# Patient Record
Sex: Female | Born: 1935 | Race: White | Hispanic: No | Marital: Married | Smoking: Never smoker
Health system: Southern US, Community
[De-identification: ages and names within clinical notes are randomized; demographics above are authoritative.]

## PROBLEM LIST (undated history)

## (undated) DIAGNOSIS — M545 Low back pain, unspecified: Secondary | ICD-10-CM

## (undated) DIAGNOSIS — E785 Hyperlipidemia, unspecified: Secondary | ICD-10-CM

## (undated) DIAGNOSIS — E119 Type 2 diabetes mellitus without complications: Secondary | ICD-10-CM

## (undated) DIAGNOSIS — I10 Essential (primary) hypertension: Secondary | ICD-10-CM

## (undated) DIAGNOSIS — E7800 Pure hypercholesterolemia, unspecified: Secondary | ICD-10-CM

## (undated) DIAGNOSIS — I509 Heart failure, unspecified: Secondary | ICD-10-CM

## (undated) DIAGNOSIS — R011 Cardiac murmur, unspecified: Secondary | ICD-10-CM

## (undated) HISTORY — DX: Essential (primary) hypertension: I10

## (undated) HISTORY — PX: APPENDECTOMY: SHX54

## (undated) HISTORY — DX: Morbid (severe) obesity due to excess calories: E66.01

## (undated) HISTORY — DX: Low back pain: M54.5

## (undated) HISTORY — DX: Pure hypercholesterolemia, unspecified: E78.00

## (undated) HISTORY — DX: Hyperlipidemia, unspecified: E78.5

## (undated) HISTORY — DX: Low back pain, unspecified: M54.50

## (undated) HISTORY — DX: Cardiac murmur, unspecified: R01.1

## (undated) HISTORY — PX: TONSILLECTOMY: SUR1361

## (undated) HISTORY — DX: Type 2 diabetes mellitus without complications: E11.9

## (undated) HISTORY — DX: Heart failure, unspecified: I50.9

---

## 1999-03-13 ENCOUNTER — Encounter: Payer: Self-pay | Admitting: Gastroenterology

## 1999-03-13 ENCOUNTER — Ambulatory Visit (HOSPITAL_COMMUNITY): Admission: RE | Admit: 1999-03-13 | Discharge: 1999-03-13 | Payer: Self-pay | Admitting: Gastroenterology

## 2001-03-16 ENCOUNTER — Encounter: Payer: Self-pay | Admitting: Obstetrics and Gynecology

## 2001-03-16 ENCOUNTER — Ambulatory Visit (HOSPITAL_COMMUNITY): Admission: RE | Admit: 2001-03-16 | Discharge: 2001-03-16 | Payer: Self-pay | Admitting: Obstetrics and Gynecology

## 2001-04-14 ENCOUNTER — Encounter (INDEPENDENT_AMBULATORY_CARE_PROVIDER_SITE_OTHER): Payer: Self-pay

## 2001-04-14 ENCOUNTER — Ambulatory Visit (HOSPITAL_COMMUNITY): Admission: RE | Admit: 2001-04-14 | Discharge: 2001-04-14 | Payer: Self-pay | Admitting: Obstetrics and Gynecology

## 2001-04-14 ENCOUNTER — Encounter: Payer: Self-pay | Admitting: Obstetrics and Gynecology

## 2005-12-19 ENCOUNTER — Other Ambulatory Visit: Admission: RE | Admit: 2005-12-19 | Discharge: 2005-12-19 | Payer: Self-pay | Admitting: Obstetrics and Gynecology

## 2006-01-24 ENCOUNTER — Inpatient Hospital Stay (HOSPITAL_COMMUNITY): Admission: EM | Admit: 2006-01-24 | Discharge: 2006-01-25 | Payer: Self-pay | Admitting: Emergency Medicine

## 2006-01-24 ENCOUNTER — Encounter (INDEPENDENT_AMBULATORY_CARE_PROVIDER_SITE_OTHER): Payer: Self-pay | Admitting: Specialist

## 2007-04-28 IMAGING — CR DG ABDOMEN ACUTE W/ 1V CHEST
3 series · 3 of 3 positions shown · non-contrast
Comparison: None

CLINICAL DATA: Abdominal pain

ABDOMEN SERIES - 2 VIEW & CHEST - 1 VIEW

[w chest pa]
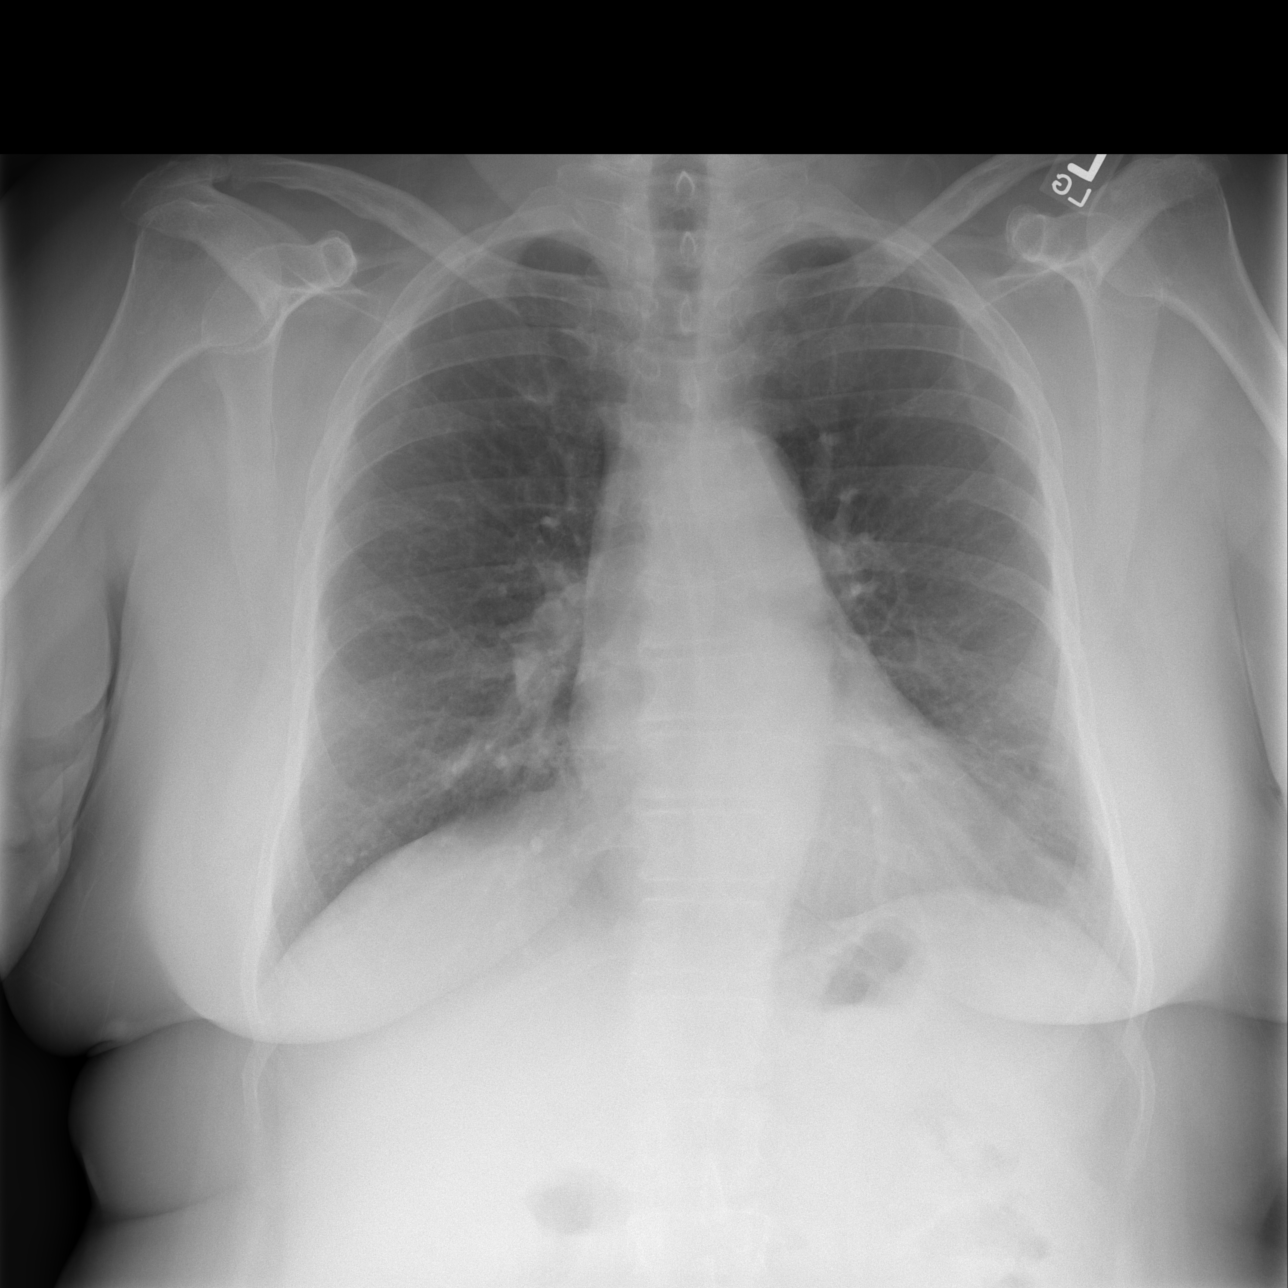

[w abdomen upright *]
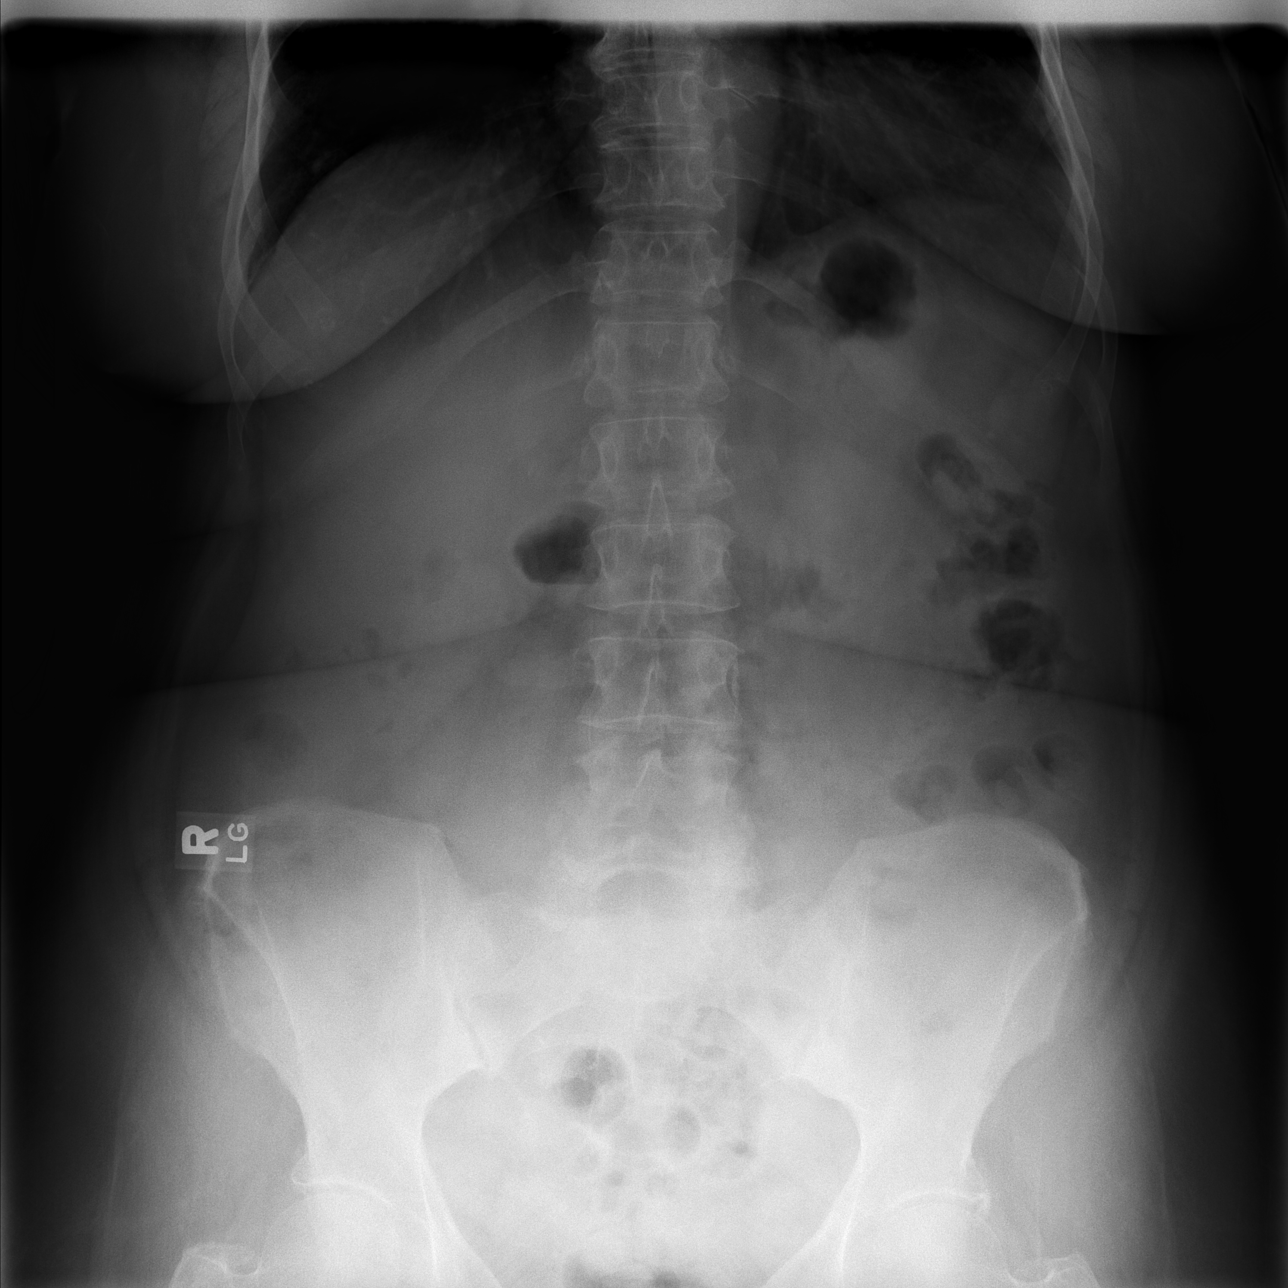

[t abdomen supine]
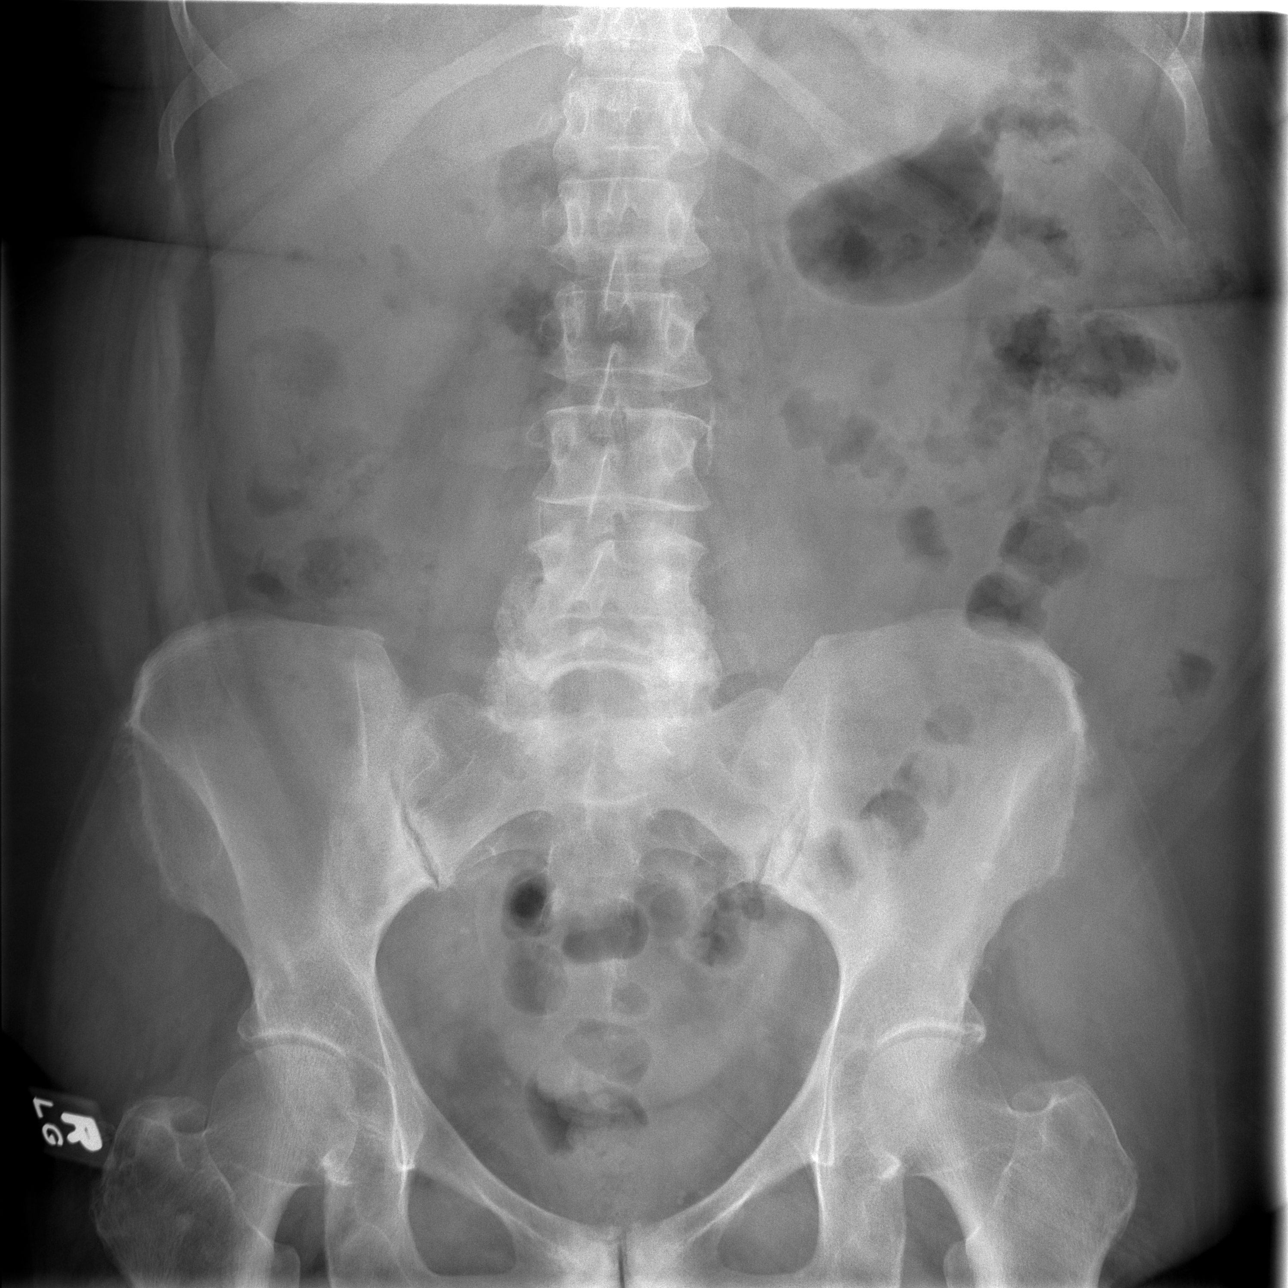

[3 of 3 positions shown; findings below may reference images not displayed]

FINDINGS: There is a nonobstructive bowel gas pattern. No free air. No
organomegaly or suspicious calcification. Mild degenerative changes in the lower
lumbar spine and hips.

Heart is normal size. Lungs are clear. No effusions.

IMPRESSION

No obstruction or free air.

## 2007-04-28 IMAGING — CT CT ABDOMEN W/ CM
2 of 5 series · 17 of 46 positions shown, 19 images · IV contrast (APPLIED)
Comparison: None

ABDOMEN CT WITH CONTRAST

CLINICAL DATA: Right lower quadrant pain
TECHNIQUE: Multidetector CT imaging of the abdomen and pelvis was performed
following the standard protocol during bolus administration of intravenous
contrast.

Contrast:  125 cc Omnipaque 300

[Series 2: abd_pel 5.0 b40f st · axial · 0.73mm/px · z∈[-460,-64]mm · 14 of 89 slices shown, 16 images]
[im 5/89  soft-tissue]
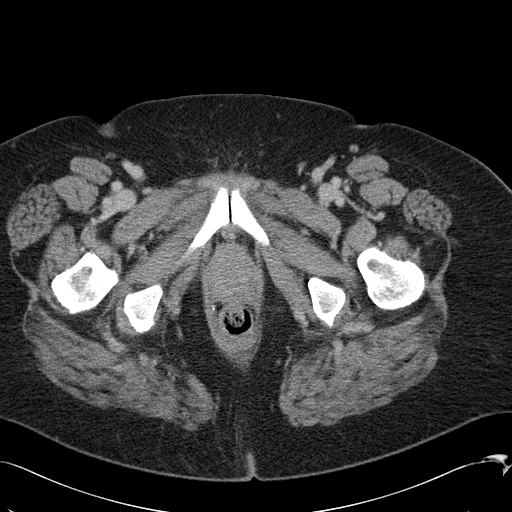
[im 5/89  bone]
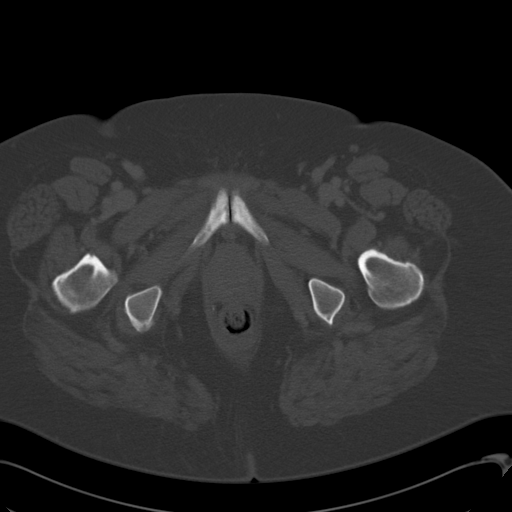
[im 10/89  soft-tissue]
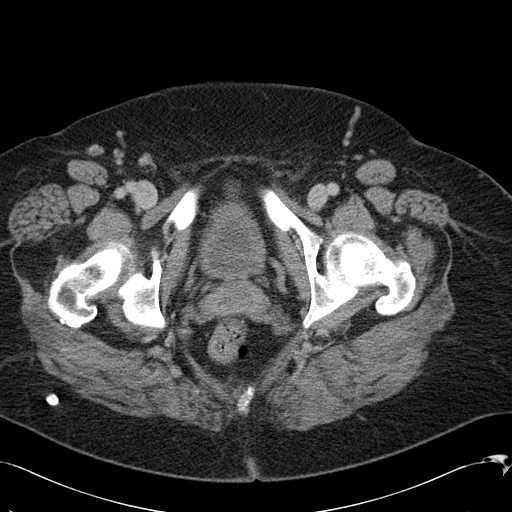
[im 20/89  soft-tissue]
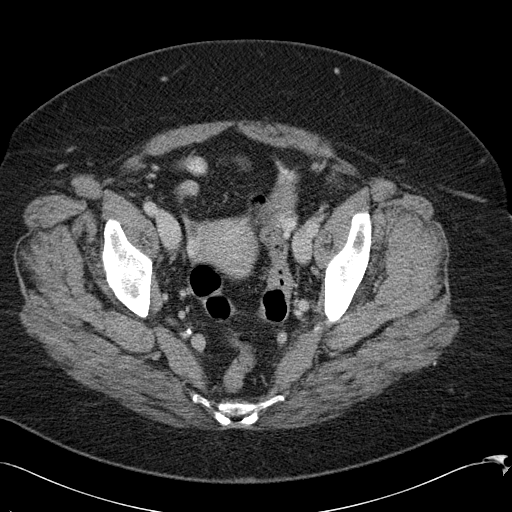
[im 25/89  soft-tissue]
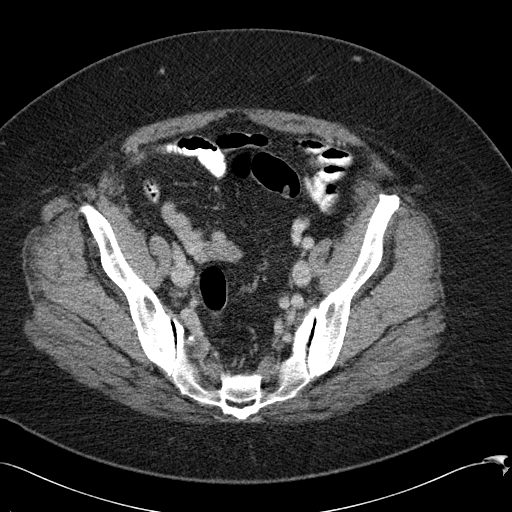
[im 30/89  soft-tissue]
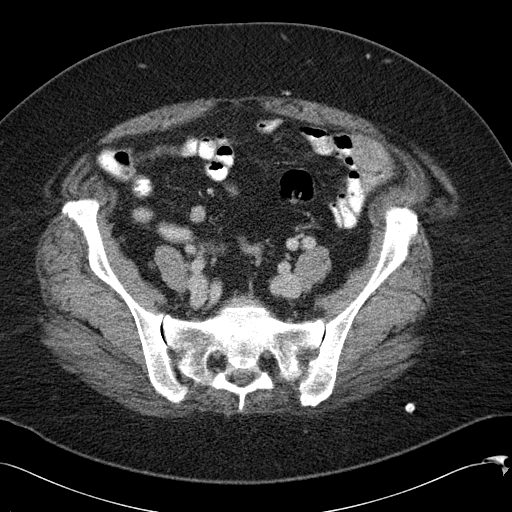
[im 35/89  soft-tissue]
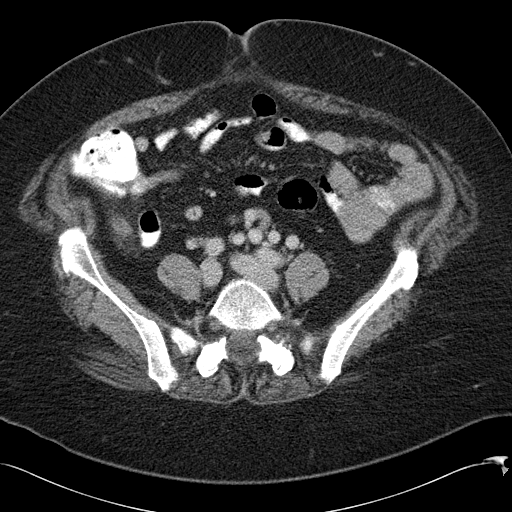
[im 40/89  soft-tissue]
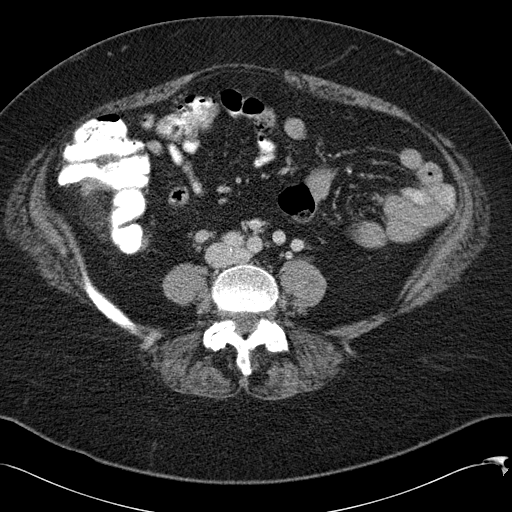
[im 49/89  soft-tissue]
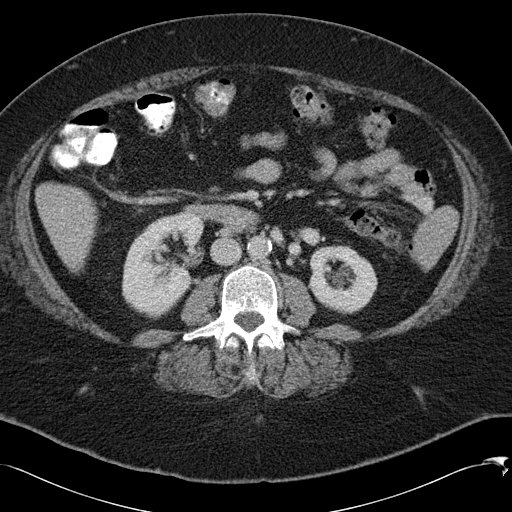
[im 54/89  soft-tissue]
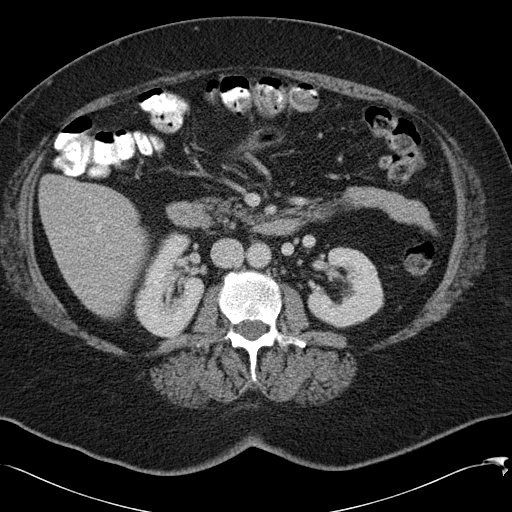
[im 54/89  bone]
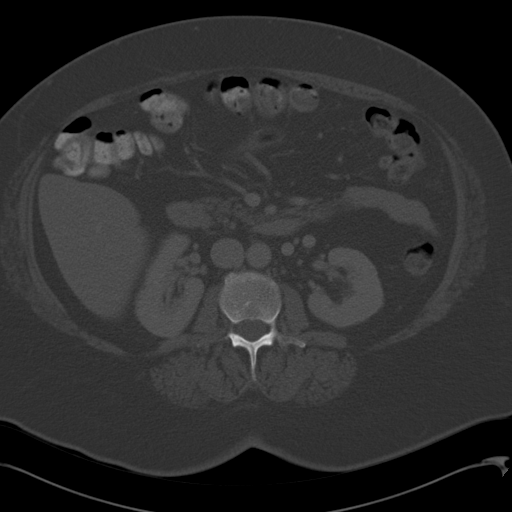
[im 59/89  soft-tissue]
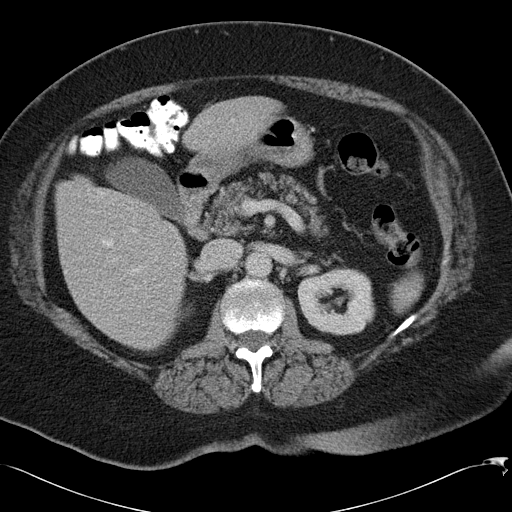
[im 64/89  soft-tissue]
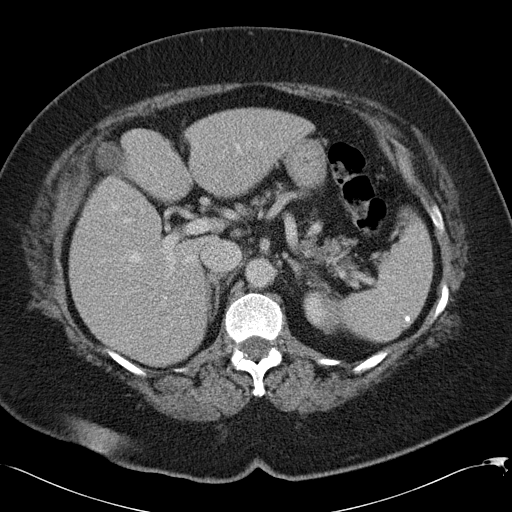
[im 69/89  soft-tissue]
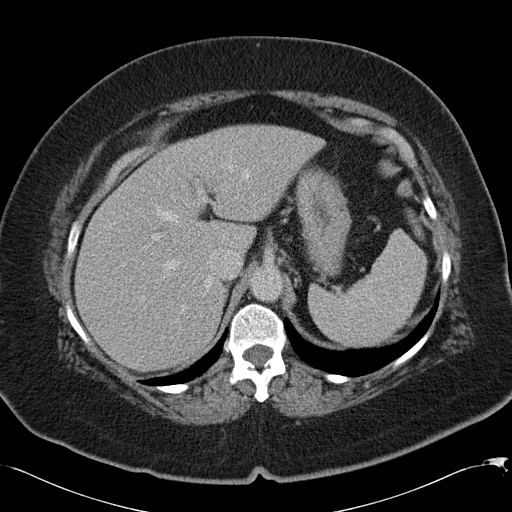
[im 79/89  soft-tissue]
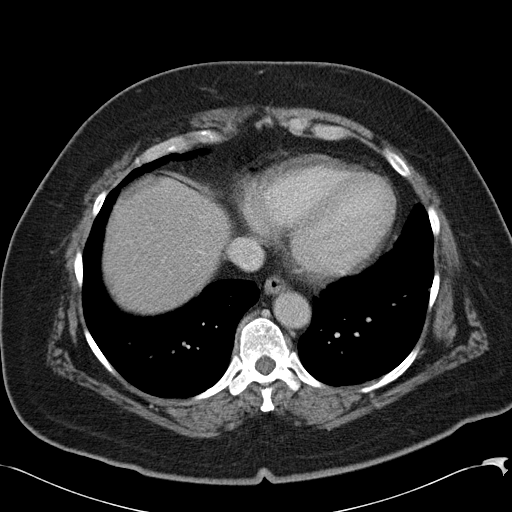
[im 84/89  soft-tissue]
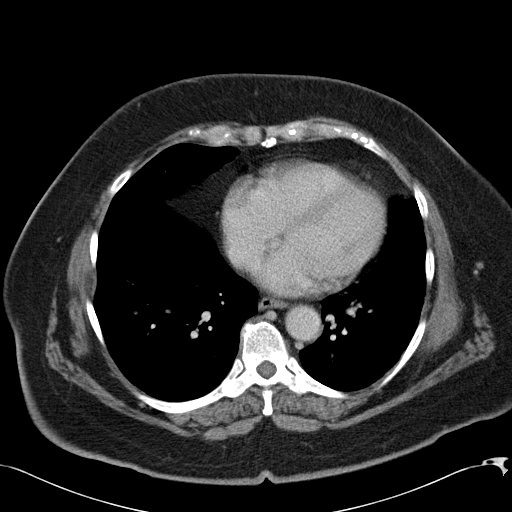

[Series 602: coronal abdomen · coronal · 0.90mm/px · 3 of 160 slices shown]
[im 54/160  soft-tissue]
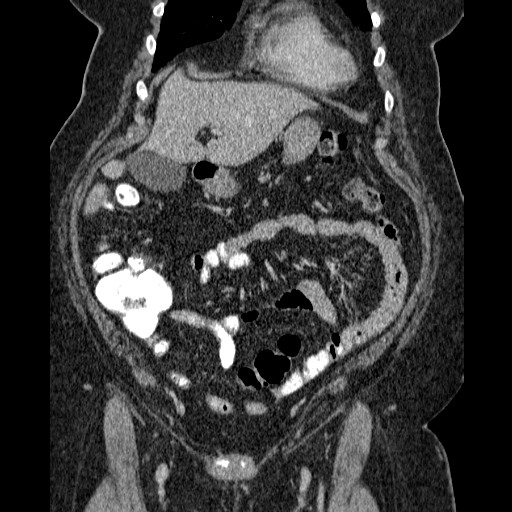
[im 71/160  soft-tissue]
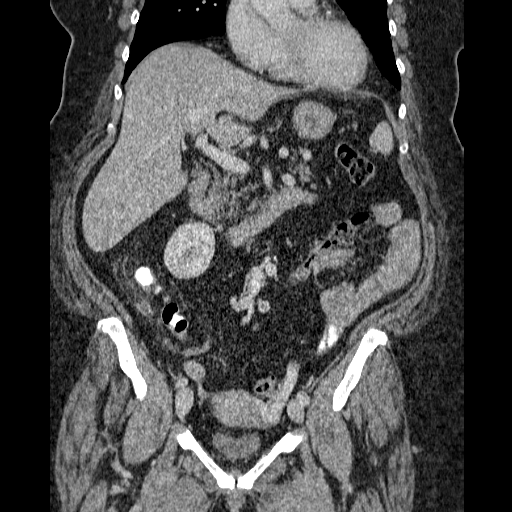
[im 89/160  soft-tissue]
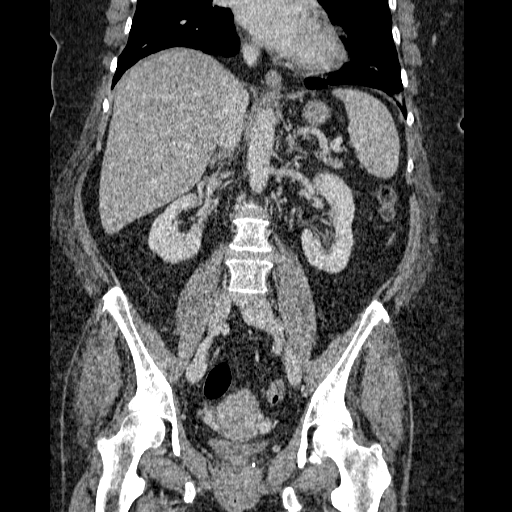

[17 of 46 positions shown; findings below may reference images not displayed]

FINDINGS: Minimal lingular atelectasis. Otherwise lung bases clear.
Calcifications within the spleen. Otherwise, solid organs have an unremarkable
appearance. Gallbladder grossly unremarkable. No free fluid, free air, or
adenopathy.

IMPRESSION

Minimal lingular atelectasis.

PELVIS CT WITH CONTRAST
FINDINGS: The appendix is thickened with surrounding inflammation, compatible
with appendicitis. Appendix measures 15 mm in diameter.

Scattered sigmoid diverticula. No free fluid, free air, or adenopathy.

IMPRESSION

Findings compatible with appendicitis. 

These results were called to Dr. Rebbestad at the time of interpretation.

## 2010-11-18 ENCOUNTER — Ambulatory Visit: Payer: Self-pay | Admitting: Cardiology

## 2011-03-14 NOTE — Op Note (Signed)
[name], [name]   MEDICAL RECORD NO.:  [id number]          PATIENT TYPE:  INP   LOCATION:  0098                         FACILITY:  [facility]   PHYSICIAN:  Anselm Pancoast. Weatherly, M.D.DATE OF BIRTH:  [birth year]   DATE OF PROCEDURE:  01/24/2006  DATE OF DISCHARGE:                                 OPERATIVE REPORT   PREOPERATIVE DIAGNOSIS:  Acute appendicitis.   POSTOPERATIVE DIAGNOSIS:  Acute appendicitis.   OPERATION:  Laparoscopic appendectomy.   ANESTHESIA:  General.   SURGEON:  Dr. Consuello Bossier   ASSISTANT:  Nurse.   HISTORY:  Patricia Petersen is a 77 year old overweight female, presented to the  ER with about a 2-day history of progressive abdominal pain originally  thought to be flu, and then it kind of shifted to the lower right abdomen.  Her husband is a retired Development worker, community, and he accompanied her and when she was  first seen in the emergency room, Dr. Estell Harpin said he thought that she  probably had a urinary tract infection but then after working her up  further, elevated white count, obtained a CT that showed acutely inflamed  appendix.  She was definitely tender in the right lower abdomen, but it is  more diffuse and not very well localized.  She has been nauseous, both acute  abdominal x-rays were kind of unrevealing.  Preoperatively, she was given 2  grams of Mefoxin.  I met with her after the diagnosis was made and was in  agreement with the decision that she needs an appendectomy and discussed  with her that I thought I could do her laparoscopically.  She is fairly  heavy, but it appears that this is not truly a retrocecal appendix, but her  appendix is just way down into the pelvis.   The patient was taken to the operative suite.  The Foley catheter was  inserted sterilely.  She has got PAS stockings and after induction of  anesthesia by Dr. Shireen Quan, the abdomen was prepped with Betadine solution  and draped in a sterile  manner.  She does have a little fascia defect or  probably a little umbilical weakness, and I elected to make the little  incision just above the umbilicus for the easy exposure because of her size.  Sharp dissection down through the skin and subcutaneous tissue, identifying  the fascia.  This was picked up between 2 Kochers and a small opening made,  and then the underlying peritoneum was opened with a Tresa Endo, and a  pursestring suture of 0 Vicryl was placed and Hasson cannula introduced.  You could see inflammatory reaction, a little fluid in the right lower  quadrant.  The 5 mm port was placed in the right upper quadrant, and then  the 10-11 was placed in the left lower quadrant.  With a Kingsley Spittle, I could  kind of manipulate the dilated cecum so I could visualize the inflammatory  reaction around the acutely inflamed appendix.  I could grasp the fatty  tissue and then elevate it.  There was a loop of small bowel that was  adherent  to this, and I kind of carefully dissected it away, and a few  little areas of an acute and chronic inflammation were dissected down with  the harmonic scalpel.  When this loop of small bowel dropped away, then you  could see the mesentery of the appendix and the completely inflamed  appendix.  I then used the harmonic scalpel to kind of divide the  appendiceal mesentery, working right on down to its base.  The appendix was  inflamed right on until probably a centimeter from its junction with the  cecum and then using the vascular GIA under the appendix, really right  across the base of the appendix, it was fired and then the inflamed appendix  and mesentery was placed in an EndoCatch bag.  We brought this out through  the umbilicus, reinserted the harmonic in the umbilical port.  I had worked  with the camera predominantly in the left lower quadrant area, had switched  the camera to the umbilical port when I placed the GIA stapler in for  exposure.  I then  switched the camera back to the left lower quadrant and  reinspected.  There was no evidence of any bleeding from the staple line,  and the mesentery of the appendiceal mesentery was not bleeding.  I  thoroughly irrigated this and then withdrew the left lower quadrant port  under direct vision.  Good hemostasis.  Then withdrew the upper 5 mm port.  The little irrigating fluid had been aspirated, and the carbon dioxide was  released.  I put an additional figure-of-eight suture in the fascia, but she  has basically a little umbilical hernia, and I was not getting good closure,  so then I basically dissected the skin going down the little fascial defect  and then invaginated this and after I could feel all the fascia edges, then  closed the actual defect at the umbilicus with a couple of figure-of-eight  sutures of 0 Vicryl in addition to the ones that I had placed in the little  opening that was really just above the actual fascia defect.  The fascia now  appears to be not only airtight but strong with feeling it with a Tresa Endo and  my finger.  The subcutaneous tissue was closed with 4-0 Vicryl.  Benzoin and  Steri-Strips were placed on all of the little incisions.  I am going to give  the patient another dose of Mefoxin later today and sure we will keep her  today since her husband's evaluation is undergoing, but hopefully she will  be ready for discharge in the a.m. Sunday.           ______________________________  Anselm Pancoast. Zachery Dakins, M.D.     WJW/MEDQ  D:  01/24/2006  T:  01/26/2006  Job:  [id number]

## 2011-03-14 NOTE — H&P (Signed)
NAMEKAHLIE, DEUTSCHER NO.:  [id number]   MEDICAL RECORD NO.:  [id number]          PATIENT TYPE:  INP   LOCATION:  0098                         FACILITY:  [facility]   PHYSICIAN:  Anselm Pancoast. Weatherly, M.D.DATE OF BIRTH:  Mar 07, 1934   DATE OF ADMISSION:  01/24/2006  DATE OF DISCHARGE:                                HISTORY & PHYSICAL   CHIEF COMPLAINT:  Abdominal pain.   HISTORY:  Patricia Petersen is a 77 year old female, wife of a local retired  physician, who stated that for about 2 to 3 days she has felt kind of  cramping, bloating, flu-like type symptoms, more pain on Friday and then on  Friday evening the pain became more shifting to the right lower abdomen.  She presented to the emergency room here at about 1 a.m.  She was seen by  the ER physician. Originally he was wondering if this could be possibly  urinary tract infection, but on examination, she did feel mildly tender very  low in the abdomen, and her white count was elevated.  He first did an acute  abdominal x-ray, and this was not revealing.  He then got a CT which showed  acutely inflamed appendix, real low in the right lower quadrant. She is  moderately obese and has a history of mild hypertension.   CHRONIC MEDICATIONS:  She is on Cardizem for mildly elevated blood pressure,  and Benicar.   ALLERGIES:  1.  She states she is allergic to PENICILLIN which causes difficulty      breathing.  2.  SULFA.  3.  LEVAQUIN.   The patient states that the only surgery she has had was I think a D&C.  I  am not sure when her last colonoscopy was.   FAMILY HISTORY:  Noncontributory.   SOCIAL HISTORY:  I think she has three children.  Her husband, as I said, is  a retired Development worker, community.  Incidental while in the emergency room, he appeared to  have a syncopal episode of about 3 minutes, and arrangements are being made  to admit him, and I do not have any details about that.   PHYSICAL EXAMINATION:  GENERAL:  She is  a pleasant, overweight female in  kind of mild discomfort in the lower right abdomen.  VITAL SIGNS:  When she arrived, temperature was 99.1, pulse elevated at 115,  but since then the pulse has been in the 70s to 80s.  Respirations 22.  EYES, EARS, NOSE, THROAT: She appears adequately hydrated.  NECK:  No cervical or supraclavicular lymphadenopathy.  LUNGS:  Good breath sounds , and her chest x-ray was unremarkable.  BREASTS/ABDOMEN: I did not do a breast or abdominal exam.  She has a very  large abdomen.  She is mildly tender in the right lower abdomen, sort of  diffuse but certainly more in the right lower quadrant.  The patient is not  tender in the upper abdomen.  PELVIC/RECTAL:  I did not do a pelvic and rectal exam since she has a CT  that showed acutely inflamed appendix that does not appear to be  ruptured.  CNS:  Physiologic.  SKIN:  Negative.   ADMISSION IMPRESSION:  1.  Acute appendicitis, hopefully not ruptured.  2.  Exogenous obesity.  3.  History of mild hypertension.   PLAN:  We will proceed on with laparoscopic appendectomy, and the patient is  in agreement with this.  She was given 2 g __________ while in the emergency  room after the diagnosis was made.           ______________________________  Anselm Pancoast. Zachery Dakins, M.D.     WJW/MEDQ  D:  01/24/2006  T:  01/25/2006  Job:  [id number]

## 2011-03-14 NOTE — Op Note (Signed)
[facility]  Patient:    CARRELL, PALMATIER                      MRN: [id number] Proc. Date: 04/14/01 Adm. Date:  [id number] Attending:  Michaele Offer                           Operative Report  PREOPERATIVE DIAGNOSIS:  Postmenopausal bleeding and probable endometrial lesion.  POSTOPERATIVE DIAGNOSIS:  Postmenopausal bleeding and submucosal leiomyomas.  PROCEDURE:  Hysteroscopy with resection of leiomyoma, and dilation and curettage.  SURGEON:  Zenaida Niece, M.D.  ANESTHESIA:  Monitored anesthesia care with IV sedation and paracervical block.  ESTIMATED BLOOD LOSS:  50 cc.  Input and output through the hysteroscope revealed no significant deficit.  FINDINGS:  Normal size uterus with a small posterior submucosal myoma which was resected.  There was no evidence of endometrial polyps and the endometrium appeared otherwise atrophic.  There was possibly a fundal submucosal myoma.  I resected a portion of this and it did not bulge out, so no further resection was possible.  COUNTS:  Correct.  CONDITION:  Stable.  DESCRIPTION OF PROCEDURE:  The patient was taken to the operating room and placed in mobile stirrups while she was awake due to _________.  She was then given IV sedation and her perineum and vagina were prepped and draped in the usual sterile fashion.  A bivalve speculum was inserted in the vagina and the anterior lip of the cervix grasped with a single tooth tenaculum. Paracervical block was then performed with 1% lidocaine.  The uterus then sounded to approximately 10 cm.  The cervix was initially stenotic, but then yield to the uterine sound.  The cervix was gradually dilated to a size 31 dilator without much difficulty.  The resectoscope was then inserted and good visualization achieved.  There was a posterior right submucosal leiomyoma and possibly one at the fundus.  The posterior leiomyoma was resected with  the resectoscope with the cutting loupe with cutting current.  After portions were removed, the myoma bulged out further into the endometrial cavity and further portions were resected.  A small portion of the fundal portion was resected. It did not bulge out any further, so no further resection was carried out. Bleeding was controlled with cautery via the loupe.  Sharp curettage was then performed with good uterine cry in all quadrants and most of the pieces of tissue were removed.  The hysteroscope was again inserted and there were found to be no other endometrial lesions.  The hysteroscope was removed and further pieces of tissue were removed with this uterine forceps as well as small curettage.  The single tooth tenaculum was removed and bleeding controlled inferior turbinate hypertrophy pressure. All instruments were then removed from the vagina.  The patient was awakened in the operating room and tolerated the procedure well, and was taken to the recovery room in stable condition. DD:  04/14/01 TD:  04/14/01 Job: [id number]

## 2011-03-20 ENCOUNTER — Other Ambulatory Visit: Payer: Self-pay | Admitting: *Deleted

## 2011-03-20 MED ORDER — DILTIAZEM HCL ER COATED BEADS 180 MG PO CP24: 180.0000 mg | ORAL_CAPSULE | Freq: Every day | ORAL | Status: DC

## 2011-03-20 NOTE — Telephone Encounter (Signed)
Refill done.  

## 2011-04-15 ENCOUNTER — Other Ambulatory Visit: Payer: Self-pay | Admitting: *Deleted

## 2011-04-15 MED ORDER — OLMESARTAN MEDOXOMIL-HCTZ 40-25 MG PO TABS: 1.0000 | ORAL_TABLET | Freq: Every day | ORAL | Status: DC

## 2012-03-03 ENCOUNTER — Encounter: Payer: Self-pay | Admitting: *Deleted

## 2012-07-06 ENCOUNTER — Encounter: Payer: Self-pay | Admitting: Cardiology

## 2015-01-26 ENCOUNTER — Other Ambulatory Visit: Payer: Self-pay | Admitting: Dermatology

## 2015-08-28 ENCOUNTER — Other Ambulatory Visit (HOSPITAL_COMMUNITY): Payer: Self-pay | Admitting: Geriatric Medicine

## 2015-08-28 DIAGNOSIS — R011 Cardiac murmur, unspecified: Secondary | ICD-10-CM

## 2015-09-05 ENCOUNTER — Other Ambulatory Visit: Payer: Self-pay

## 2015-09-05 ENCOUNTER — Ambulatory Visit (HOSPITAL_COMMUNITY): Payer: Medicare Other | Attending: Cardiology

## 2015-09-05 DIAGNOSIS — I358 Other nonrheumatic aortic valve disorders: Secondary | ICD-10-CM | POA: Diagnosis not present

## 2015-09-05 DIAGNOSIS — E669 Obesity, unspecified: Secondary | ICD-10-CM | POA: Diagnosis not present

## 2015-09-05 DIAGNOSIS — R011 Cardiac murmur, unspecified: Secondary | ICD-10-CM | POA: Diagnosis present

## 2015-09-05 DIAGNOSIS — I5189 Other ill-defined heart diseases: Secondary | ICD-10-CM | POA: Diagnosis not present

## 2015-09-05 DIAGNOSIS — Z6841 Body Mass Index (BMI) 40.0 and over, adult: Secondary | ICD-10-CM | POA: Insufficient documentation

## 2015-11-19 DIAGNOSIS — J069 Acute upper respiratory infection, unspecified: Secondary | ICD-10-CM | POA: Diagnosis not present

## 2015-11-19 DIAGNOSIS — B9789 Other viral agents as the cause of diseases classified elsewhere: Secondary | ICD-10-CM | POA: Diagnosis not present

## 2019-08-17 ENCOUNTER — Ambulatory Visit: Payer: Medicare Other | Admitting: Family

## 2019-11-01 ENCOUNTER — Encounter: Payer: Self-pay | Admitting: Podiatry

## 2019-11-01 ENCOUNTER — Other Ambulatory Visit: Payer: Self-pay

## 2019-11-01 ENCOUNTER — Ambulatory Visit (INDEPENDENT_AMBULATORY_CARE_PROVIDER_SITE_OTHER): Payer: Medicare Other | Admitting: Podiatry

## 2019-11-01 VITALS — BP 135/74 | HR 72

## 2019-11-01 DIAGNOSIS — M79675 Pain in left toe(s): Secondary | ICD-10-CM

## 2019-11-01 DIAGNOSIS — M79674 Pain in right toe(s): Secondary | ICD-10-CM | POA: Diagnosis not present

## 2019-11-01 DIAGNOSIS — M2011 Hallux valgus (acquired), right foot: Secondary | ICD-10-CM

## 2019-11-01 DIAGNOSIS — B351 Tinea unguium: Secondary | ICD-10-CM

## 2019-11-01 DIAGNOSIS — E119 Type 2 diabetes mellitus without complications: Secondary | ICD-10-CM

## 2019-11-01 DIAGNOSIS — M2012 Hallux valgus (acquired), left foot: Secondary | ICD-10-CM

## 2019-11-01 DIAGNOSIS — Z0189 Encounter for other specified special examinations: Secondary | ICD-10-CM

## 2019-11-01 NOTE — Patient Instructions (Addendum)
[facility] [phone]   [email or web address]   Diabetes Mellitus and Foot Care Foot care is an important part of your health, especially when you have diabetes. Diabetes may cause you to have problems because of poor blood flow (circulation) to your feet and legs, which can cause your skin to:  Become thinner and drier.  Break more easily.  Heal more slowly.  Peel and crack. You may also have nerve damage (neuropathy) in your legs and feet, causing decreased feeling in them. This means that you may not notice minor injuries to your feet that could lead to more serious problems. Noticing and addressing any potential problems early is the best way to prevent future foot problems. How to care for your feet Foot hygiene  Wash your feet daily with warm water and mild soap. Do not use hot water. Then, pat your feet and the areas between your toes until they are completely dry. Do not soak your feet as this can dry your skin.  Trim your toenails straight across. Do not dig under them or around the cuticle. File the edges of your nails with an emery board or nail file.  Apply a moisturizing lotion or petroleum jelly to the skin on your feet and to dry, brittle toenails. Use lotion that does not contain alcohol and is unscented. Do not apply lotion between your toes. Shoes and socks  Wear clean socks or stockings every day. Make sure they are not too tight. Do not wear knee-high stockings since they may decrease blood flow to your legs.  Wear shoes that fit properly and have enough cushioning. Always look in your shoes before you put them on to be sure there are no objects inside.  To break in new shoes, wear them for just a few hours a day. This prevents injuries on your feet. Wounds, scrapes, corns, and calluses  Check your feet daily for blisters, cuts, bruises, sores, and redness. If you cannot see the bottom of your feet, use a mirror or ask someone for help.  Do not  cut corns or calluses or try to remove them with medicine.  If you find a minor scrape, cut, or break in the skin on your feet, keep it and the skin around it clean and dry. You may clean these areas with mild soap and water. Do not clean the area with peroxide, alcohol, or iodine.  If you have a wound, scrape, corn, or callus on your foot, look at it several times a day to make sure it is healing and not infected. Check for: ? Redness, swelling, or pain. ? Fluid or blood. ? Warmth. ? Pus or a bad smell. General instructions  Do not cross your legs. This may decrease blood flow to your feet.  Do not use heating pads or hot water bottles on your feet. They may burn your skin. If you have lost feeling in your feet or legs, you may not know this is happening until it is too late.  Protect your feet from hot and cold by wearing shoes, such as at the beach or on hot pavement.  Schedule a complete foot exam at least once a year (annually) or more often if you have foot problems. If you have foot problems, report any cuts, sores, or bruises to your health care provider immediately. Contact a health care provider if:  You have a medical condition that increases your risk of infection and you have any cuts, sores, or bruises on your feet.  You have an injury that is not healing.  You have redness on your legs or feet.  You feel burning or tingling in your legs or feet.  You have pain or cramps in your legs and feet.  Your legs or feet are numb.  Your feet always feel cold.  You have pain around a toenail. Get help right away if:  You have a wound, scrape, corn, or callus on your foot and: ? You have pain, swelling, or redness that gets worse. ? You have fluid or blood coming from the wound, scrape, corn, or callus. ? Your wound, scrape, corn, or callus feels warm to the touch. ? You have pus or a bad smell coming from the wound, scrape, corn, or callus. ? You have a fever. ? You  have a red line going up your leg. Summary  Check your feet every day for cuts, sores, red spots, swelling, and blisters.  Moisturize feet and legs daily.  Wear shoes that fit properly and have enough cushioning.  If you have foot problems, report any cuts, sores, or bruises to your health care provider immediately.  Schedule a complete foot exam at least once a year (annually) or more often if you have foot problems. This information is not intended to replace advice given to you by your health care provider. Make sure you discuss any questions you have with your health care provider. Document Revised: 07/06/2019 Document Reviewed: 11/14/2016 Elsevier Patient Education  [place].

## 2019-11-06 ENCOUNTER — Encounter: Payer: Self-pay | Admitting: Podiatry

## 2019-11-06 NOTE — Progress Notes (Signed)
Subjective: Patricia Petersen presents today referred by Lajean Manes, MD for diabetic foot evaluation.  Patient relates 2 year history of diabetes.  Patient denies any history of foot wounds.  Patient denies any history of numbness, tingling, burning, pins/needles sensations.  Today, patient c/o of painful, discolored, thick toenails which interfere with daily activities.  Pain is aggravated when wearing enclosed shoe gear.   She has seen podiatrists in the past over the years.   Past Medical History:  Diagnosis Date  . Diabetes ([place])   . HLD (hyperlipidemia)   . HTN (hypertension)   . Hypercholesteremia   . Low back pain   . Obesities, morbid ([place])    There are no problems to display for this patient.  Past Surgical History:  Procedure Laterality Date  . APPENDECTOMY    . TONSILLECTOMY      Current Outpatient Medications on File Prior to Visit  Medication Sig Dispense Refill  . diclofenac (VOLTAREN) 75 MG EC tablet TAKE 1 TABLET BY MOUTH TWICE DAILY.    Marland Kitchen Difluprednate (DUREZOL) 0.05 % EMUL     . diltiazem (TIAZAC) 180 MG 24 hr capsule Take by mouth.    Marland Kitchen aspirin 81 MG tablet Take 81 mg by mouth daily.    . Aspirin-Calcium Carbonate 81-777 MG TABS Take by mouth.    Marland Kitchen atorvastatin (LIPITOR) 20 MG tablet Take 20 mg by mouth daily.    . cholecalciferol (VITAMIN D) 1000 UNITS tablet Take 1,000 Units by mouth daily.    . clotrimazole-betamethasone (LOTRISONE) cream     . diltiazem (CARDIZEM CD) 180 MG 24 hr capsule Take 1 capsule (180 mg total) by mouth daily. 30 capsule 5  . estradiol (ESTRACE) 0.1 MG/GM vaginal cream     . fluconazole (DIFLUCAN) 200 MG tablet     . furosemide (LASIX) 40 MG tablet Take by mouth.    Marland Kitchen JARDIANCE 10 MG TABS tablet Take 10 mg by mouth daily.    . metFORMIN (GLUCOPHAGE) 1000 MG tablet Take by mouth.    . olmesartan-hydrochlorothiazide (BENICAR HCT) 40-25 MG per tablet Take 1 tablet by mouth daily. 30 tablet 6  . simvastatin (ZOCOR) 20 MG  tablet Take 20 mg by mouth every evening.     No current facility-administered medications on file prior to visit.     Allergies  Allergen Reactions  . Arthro-Complex [Nutritional Supplements]   . Levaquin [Levofloxacin In D5w]   . Lipitor [Atorvastatin]   . Penicillins     Social History   Occupational History  . Not on file  Tobacco Use  . Smoking status: Never Smoker  . Smokeless tobacco: Never Used  Substance and Sexual Activity  . Alcohol use: Not on file  . Drug use: Not on file  . Sexual activity: Not on file    Family History  Problem Relation Age of Onset  . Heart disease Other     There is no immunization history on file for this patient.  Review of systems: Positive Findings in bold print.  Constitutional:  chills, fatigue, fever, sweats, weight change Communication: Optometrist, sign Ecologist, hand writing, iPad/Android device Head: headaches, head injury Eyes: changes in vision, eye pain, glaucoma, cataracts, macular degeneration, diplopia, glare,  light sensitivity, eyeglasses or contacts, blindness Ears nose mouth throat: hearing impaired, hearing aids,  ringing in ears, deaf, sign language,  vertigo, nosebleeds,  rhinitis,  cold sores, snoring, swollen glands Cardiovascular: HTN, edema, arrhythmia, pacemaker in place, defibrillator in place, chest pain/tightness, chronic anticoagulation,  blood clot, heart failure, MI Peripheral Vascular: leg cramps, varicose veins, blood clots, lymphedema, varicosities Respiratory:  difficulty breathing, denies congestion, SOB, wheezing, cough, emphysema Gastrointestinal: change in appetite or weight, abdominal pain, constipation, diarrhea, nausea, vomiting, vomiting blood, change in bowel habits, abdominal pain, jaundice, rectal bleeding, hemorrhoids, GERD Genitourinary:  nocturia,  pain on urination, polyuria,  blood in urine, Foley catheter, urinary urgency, ESRD on hemodialysis Musculoskeletal: amputation,  cramping, stiff joints, painful joints, decreased joint motion, fractures, back pain, OA, gout, hemiplegia, paraplegia, uses cane, wheelchair bound, uses walker, uses rollator Skin: +changes in toenails, color change, dryness, itching, mole changes,  rash, wound(s) Neurological: headaches, numbness in feet, paresthesias in feet, burning in feet, fainting,  seizures, change in speech,  headaches, memory problems/poor historian, cerebral palsy, weakness, paralysis, CVA, TIA Endocrine: diabetes, hypothyroidism, hyperthyroidism,  goiter, dry mouth, flushing, heat intolerance,  cold intolerance,  excessive thirst, denies polyuria,  nocturia Hematological:  easy bleeding, excessive bleeding, easy bruising, enlarged lymph nodes, on long term blood thinner, history of past transusions Allergy/immunological:  hives, eczema, frequent infections, multiple drug allergies, seasonal allergies, transplant recipient, multiple food allergies Psychiatric:  anxiety, depression, mood disorder, suicidal ideations, hallucinations, insomnia  Objective: Vitals:   11/01/19 1415  BP: 135/74  Pulse: 72   Vascular Examination: Capillary refill time immediate x 10 digits.  Dorsalis pedis pulses palpable b/l.  Posterior tibial pulses palpable b/l.   Digital hair absent  x 10 digits  Skin temperature gradient WNL b/l.  Dermatological Examination: Skin with normal turgor, texture and tone b/l.  Toenails 1-5 b/l discolored, thick, dystrophic with subungual debris and pain with palpation to nailbeds due to thickness of nails.  Musculoskeletal: Muscle strength 5/5 to all LE muscle groups b/l.   HAV with bunion b/l.   Neurological: Sensation intact 5/5 b/l with 10 gram monofilament.  Vibratory sensation intact b/l.  Assessment: 1. Painful onychomycosis toenails 1-5 b/l  2. HAV with bunion b/l 3. NIDDM 4. Encounter for diabetic foot exam  Plan: 1. Discussed diabetic foot care principles. Literature  dispensed on today. 2. Toenails 1-5 b/l were debrided in length and girth without iatrogenic bleeding. 3. Patient to continue soft, supportive shoe gear. 4. Patient to report any pedal injuries to medical professional immediately. 5. Follow up 3 months.  6. Patient/POA to call should there be a concern in the interim.

## 2019-11-16 ENCOUNTER — Ambulatory Visit: Payer: Medicare Other | Admitting: Podiatry

## 2020-01-30 ENCOUNTER — Ambulatory Visit: Payer: Medicare Other | Admitting: Podiatry

## 2020-01-30 ENCOUNTER — Other Ambulatory Visit: Payer: Self-pay

## 2020-01-30 ENCOUNTER — Encounter: Payer: Self-pay | Admitting: Podiatry

## 2020-01-30 VITALS — Temp 97.7°F

## 2020-01-30 DIAGNOSIS — E119 Type 2 diabetes mellitus without complications: Secondary | ICD-10-CM

## 2020-01-30 DIAGNOSIS — M79674 Pain in right toe(s): Secondary | ICD-10-CM

## 2020-01-30 DIAGNOSIS — B351 Tinea unguium: Secondary | ICD-10-CM | POA: Diagnosis not present

## 2020-01-30 DIAGNOSIS — M79675 Pain in left toe(s): Secondary | ICD-10-CM

## 2020-01-30 NOTE — Patient Instructions (Signed)
Diabetes Mellitus and Foot Care Foot care is an important part of your health, especially when you have diabetes. Diabetes may cause you to have problems because of poor blood flow (circulation) to your feet and legs, which can cause your skin to:  Become thinner and drier.  Break more easily.  Heal more slowly.  Peel and crack. You may also have nerve damage (neuropathy) in your legs and feet, causing decreased feeling in them. This means that you may not notice minor injuries to your feet that could lead to more serious problems. Noticing and addressing any potential problems early is the best way to prevent future foot problems. How to care for your feet Foot hygiene  Wash your feet daily with warm water and mild soap. Do not use hot water. Then, pat your feet and the areas between your toes until they are completely dry. Do not soak your feet as this can dry your skin.  Trim your toenails straight across. Do not dig under them or around the cuticle. File the edges of your nails with an emery board or nail file.  Apply a moisturizing lotion or petroleum jelly to the skin on your feet and to dry, brittle toenails. Use lotion that does not contain alcohol and is unscented. Do not apply lotion between your toes. Shoes and socks  Wear clean socks or stockings every day. Make sure they are not too tight. Do not wear knee-high stockings since they may decrease blood flow to your legs.  Wear shoes that fit properly and have enough cushioning. Always look in your shoes before you put them on to be sure there are no objects inside.  To break in new shoes, wear them for just a few hours a day. This prevents injuries on your feet. Wounds, scrapes, corns, and calluses  Check your feet daily for blisters, cuts, bruises, sores, and redness. If you cannot see the bottom of your feet, use a mirror or ask someone for help.  Do not cut corns or calluses or try to remove them with medicine.  If you  find a minor scrape, cut, or break in the skin on your feet, keep it and the skin around it clean and dry. You may clean these areas with mild soap and water. Do not clean the area with peroxide, alcohol, or iodine.  If you have a wound, scrape, corn, or callus on your foot, look at it several times a day to make sure it is healing and not infected. Check for: ? Redness, swelling, or pain. ? Fluid or blood. ? Warmth. ? Pus or a bad smell. General instructions  Do not cross your legs. This may decrease blood flow to your feet.  Do not use heating pads or hot water bottles on your feet. They may burn your skin. If you have lost feeling in your feet or legs, you may not know this is happening until it is too late.  Protect your feet from hot and cold by wearing shoes, such as at the beach or on hot pavement.  Schedule a complete foot exam at least once a year (annually) or more often if you have foot problems. If you have foot problems, report any cuts, sores, or bruises to your health care provider immediately. Contact a health care provider if:  You have a medical condition that increases your risk of infection and you have any cuts, sores, or bruises on your feet.  You have an injury that is not   healing.  You have redness on your legs or feet.  You feel burning or tingling in your legs or feet.  You have pain or cramps in your legs and feet.  Your legs or feet are numb.  Your feet always feel cold.  You have pain around a toenail. Get help right away if:  You have a wound, scrape, corn, or callus on your foot and: ? You have pain, swelling, or redness that gets worse. ? You have fluid or blood coming from the wound, scrape, corn, or callus. ? Your wound, scrape, corn, or callus feels warm to the touch. ? You have pus or a bad smell coming from the wound, scrape, corn, or callus. ? You have a fever. ? You have a red line going up your leg. Summary  Check your feet every day  for cuts, sores, red spots, swelling, and blisters.  Moisturize feet and legs daily.  Wear shoes that fit properly and have enough cushioning.  If you have foot problems, report any cuts, sores, or bruises to your health care provider immediately.  Schedule a complete foot exam at least once a year (annually) or more often if you have foot problems. This information is not intended to replace advice given to you by your health care provider. Make sure you discuss any questions you have with your health care provider. Document Revised: 07/06/2019 Document Reviewed: 11/14/2016 Elsevier Patient Education  2020 [employer]  Bunion  A bunion is a bump on the base of the big toe that forms when the bones of the big toe joint move out of position. Bunions may be small at first, but they often get larger over time. They can make walking painful. What are the causes? A bunion may be caused by:  Wearing narrow or pointed shoes that force the big toe to press against the other toes.  Abnormal foot development that causes the foot to roll inward (pronate).  Changes in the foot that are caused by certain diseases, such as rheumatoid arthritis or polio.  A foot injury. What increases the risk? The following factors may make you more likely to develop this condition:  Wearing shoes that squeeze the toes together.  Having certain diseases, such as: ? Rheumatoid arthritis. ? Polio. ? Cerebral palsy.  Having family members who have bunions.  Being born with a foot deformity, such as flat feet or low arches.  Doing activities that put a lot of pressure on the feet, such as ballet dancing. What are the signs or symptoms? The main symptom of a bunion is a noticeable bump on the big toe. Other symptoms may include:  Pain.  Swelling around the big toe.  Redness and inflammation.  Thick or hardened skin on the big toe or between the toes.  Stiffness or loss of motion in the big  toe.  Trouble with walking. How is this diagnosed? A bunion may be diagnosed based on your symptoms, medical history, and activities. You may have tests, such as:  X-rays. These allow your health care provider to check the position of the bones in your foot and look for damage to your joint. They also help your health care provider determine the severity of your bunion and the best way to treat it.  Joint aspiration. In this test, a sample of fluid is removed from the toe joint. This test may be done if you are in a lot of pain. It helps rule out diseases that cause painful   swelling of the joints, such as arthritis. How is this treated? Treatment depends on the severity of your symptoms. The goal of treatment is to relieve symptoms and prevent the bunion from getting worse. Your health care provider may recommend:  Wearing shoes that have a wide toe box.  Using bunion pads to cushion the affected area.  Taping your toes together to keep them in a normal position.  Placing a device inside your shoe (orthotics) to help reduce pressure on your toe joint.  Taking medicine to ease pain, inflammation, and swelling.  Applying heat or ice to the affected area.  Doing stretching exercises.  Surgery to remove scar tissue and move the toes back into their normal position. This treatment is rare. Follow these instructions at home: Managing pain, stiffness, and swelling   If directed, put ice on the painful area: ? Put ice in a plastic bag. ? Place a towel between your skin and the bag. ? Leave the ice on for 20 minutes, 2-3 times a day. Activity   If directed, apply heat to the affected area before you exercise. Use the heat source that your health care provider recommends, such as a moist heat pack or a heating pad. ? Place a towel between your skin and the heat source. ? Leave the heat on for 20-30 minutes. ? Remove the heat if your skin turns bright red. This is especially important  if you are unable to feel pain, heat, or cold. You may have a greater risk of getting burned.  Do exercises as told by your health care provider. General instructions  Support your toe joint with proper footwear, shoe padding, or taping as told by your health care provider.  Take over-the-counter and prescription medicines only as told by your health care provider.  Keep all follow-up visits as told by your health care provider. This is important. Contact a health care provider if your symptoms:  Get worse.  Do not improve in 2 weeks. Get help right away if you have:  Severe pain and trouble with walking. Summary  A bunion is a bump on the base of the big toe that forms when the bones of the big toe joint move out of position.  Bunions can make walking painful.  Treatment depends on the severity of your symptoms.  Support your toe joint with proper footwear, shoe padding, or taping as told by your health care provider. This information is not intended to replace advice given to you by your health care provider. Make sure you discuss any questions you have with your health care provider. Document Revised: 04/19/2018 Document Reviewed: 02/23/2018 Elsevier Patient Education  2020 Elsevier Inc.  

## 2020-02-06 NOTE — Progress Notes (Addendum)
Subjective: Patricia Petersen presents today for follow up of preventative diabetic foot care and painful mycotic nails b/l that are difficult to trim. Pain interferes with ambulation. Aggravating factors include wearing enclosed shoe gear. Pain is relieved with periodic professional debridement.   She voices no new pedal concerns on today's visit.  Allergies  Allergen Reactions  . Arthro-Complex [Nutritional Supplements]   . Levaquin [Levofloxacin In D5w]   . Lipitor [Atorvastatin]   . Penicillins      Objective: Vitals:   01/30/20 1341  Temp: 97.7 F (36.5 C)    Pt 86 y.o. year old Caucasian female  in NAD. AAO x 3.   Vascular Examination:  Capillary refill time to digits immediate b/l. Palpable DP pulses b/l. Palpable PT pulses b/l. Pedal hair absent b/l Skin temperature gradient within normal limits b/l.  Dermatological Examination: Pedal skin with normal turgor, texture and tone bilaterally. No open wounds bilaterally. No interdigital macerations bilaterally. Toenails 1-5 b/l elongated, dystrophic, thickened, crumbly with subungual debris and tenderness to dorsal palpation.  Musculoskeletal: Normal muscle strength 5/5 to all lower extremity muscle groups bilaterally, no pain crepitus or joint limitation noted with ROM b/l and bunion deformity noted b/l  Neurological: Protective sensation intact 5/5 intact bilaterally with 10g monofilament b/l Vibratory sensation intact b/l Babinski reflex negative b/l Achilles reflex 2+ b/l Clonus negative b/l  Assessment: 1. Pain due to onychomycosis of toenails of both feet   2. Controlled type 2 diabetes mellitus without complication, without long-term current use of insulin ([place])    Plan: -Continue diabetic foot care principles. Literature dispensed on today.  -Toenails 1-5 b/l were debrided in length and girth with sterile nail nippers and dremel without iatrogenic bleeding.  -Patient to continue soft, supportive shoe gear  daily. -Patient to report any pedal injuries to medical professional immediately. -Patient/POA to call should there be question/concern in the interim.  Return in about 3 months (around 04/30/2020) for diabetic nail trim.

## 2020-05-02 ENCOUNTER — Ambulatory Visit: Payer: Medicare Other | Admitting: Podiatry

## 2020-05-09 ENCOUNTER — Encounter: Payer: Self-pay | Admitting: Podiatry

## 2020-05-09 ENCOUNTER — Ambulatory Visit: Payer: Medicare Other | Admitting: Podiatry

## 2020-05-09 ENCOUNTER — Other Ambulatory Visit: Payer: Self-pay

## 2020-05-09 DIAGNOSIS — M2011 Hallux valgus (acquired), right foot: Secondary | ICD-10-CM

## 2020-05-09 DIAGNOSIS — L84 Corns and callosities: Secondary | ICD-10-CM | POA: Diagnosis not present

## 2020-05-09 DIAGNOSIS — E119 Type 2 diabetes mellitus without complications: Secondary | ICD-10-CM

## 2020-05-09 DIAGNOSIS — M79675 Pain in left toe(s): Secondary | ICD-10-CM

## 2020-05-09 DIAGNOSIS — M79674 Pain in right toe(s): Secondary | ICD-10-CM | POA: Diagnosis not present

## 2020-05-09 DIAGNOSIS — M2012 Hallux valgus (acquired), left foot: Secondary | ICD-10-CM

## 2020-05-09 DIAGNOSIS — B351 Tinea unguium: Secondary | ICD-10-CM | POA: Diagnosis not present

## 2020-05-12 NOTE — Progress Notes (Signed)
Subjective: Patricia Petersen presents today for follow up of preventative diabetic foot care and painful mycotic nails b/l that are difficult to trim. Pain interferes with ambulation. Aggravating factors include wearing enclosed shoe gear. Pain is relieved with periodic professional debridement.   She relates tenderness to tip of left 3rd digit and right 2nd digit. Denies any drainage, redness or swelling. Pain is most noticeable when wearing enclosed shoe gear.  Allergies  Allergen Reactions  . Arthro-Complex [Nutritional Supplements]   . Levaquin [Levofloxacin In D5w]   . Lipitor [Atorvastatin]   . Penicillins      Objective: There were no vitals filed for this visit.  Pt 86 y.o. year old Caucasian female  in NAD. AAO x 3.   Vascular Examination:  Capillary refill time to digits immediate b/l. Palpable DP pulses b/l. Palpable PT pulses b/l. Pedal hair absent b/l Skin temperature gradient within normal limits b/l.  Dermatological Examination: Pedal skin with normal turgor, texture and tone bilaterally. No open wounds bilaterally. No interdigital macerations bilaterally. Toenails 1-5 b/l elongated, discolored, dystrophic, thickened, crumbly with subungual debris and tenderness to dorsal palpation. Hyperkeratotic lesion(s) L hallux, L 3rd toe, R hallux and R 2nd toe.  No erythema, no edema, no drainage, no flocculence.  Musculoskeletal: Normal muscle strength 5/5 to all lower extremity muscle groups bilaterally, no pain crepitus or joint limitation noted with ROM b/l and bunion deformity noted b/l  Neurological: Protective sensation intact 5/5 intact bilaterally with 10g monofilament b/l Vibratory sensation intact b/l Babinski reflex negative b/l Achilles reflex 2+ b/l Clonus negative b/l  Assessment: 1. Pain due to onychomycosis of toenails of both feet   2. Corns and callosities   3. Hallux valgus, acquired, bilateral   4. Controlled type 2 diabetes mellitus without complication,  without long-term current use of insulin ([place])    Plan: -Examined patient. -Continue diabetic foot care principles. -Toenails 1-5 b/l were debrided in length and girth with sterile nail nippers and dremel without iatrogenic bleeding.  -Corn(s) L 3rd toe and R 2nd toe and callus(es) L hallux and R hallux were pared utilizing sterile scalpel blade without incident. Total number debrided =4. -Patient to report any pedal injuries to medical professional immediately. -Patient to continue soft, supportive shoe gear daily. -Patient/POA to call should there be question/concern in the interim.  Return in about 3 months (around 08/09/2020) for diabetic nail trim.

## 2020-05-18 ENCOUNTER — Telehealth: Payer: Self-pay | Admitting: Podiatry

## 2020-05-18 NOTE — Telephone Encounter (Signed)
Left message informing pt that I did not see a rx for a liquid from our doctor, but if she would get the pharmacy to send an electronic message I would review and refill if appropriate.

## 2020-05-18 NOTE — Telephone Encounter (Signed)
Pts daughter called requesting information on a previous prescription the pt was given. Pts daughter believes the prescription was a liquid that she was applying to her feet but cannot remember what it was and is wanting to find out in order to get a refill.   Please give patient a call.

## 2020-05-31 ENCOUNTER — Telehealth: Payer: Self-pay | Admitting: *Deleted

## 2020-05-31 NOTE — Telephone Encounter (Signed)
Pt's dtr, Caprice Beaver called for the status of the medication for pt's toenails that is to be called to a mail order pharmacy but is not in the chart.

## 2020-06-01 ENCOUNTER — Encounter: Payer: Self-pay | Admitting: Podiatry

## 2020-08-15 ENCOUNTER — Ambulatory Visit: Payer: Medicare Other | Admitting: Podiatry

## 2020-12-05 ENCOUNTER — Other Ambulatory Visit: Payer: Self-pay

## 2020-12-05 ENCOUNTER — Ambulatory Visit: Payer: Medicare Other | Admitting: Podiatry

## 2020-12-05 DIAGNOSIS — E119 Type 2 diabetes mellitus without complications: Secondary | ICD-10-CM

## 2020-12-05 DIAGNOSIS — M79675 Pain in left toe(s): Secondary | ICD-10-CM | POA: Diagnosis not present

## 2020-12-05 DIAGNOSIS — M79674 Pain in right toe(s): Secondary | ICD-10-CM

## 2020-12-05 DIAGNOSIS — L84 Corns and callosities: Secondary | ICD-10-CM

## 2020-12-05 DIAGNOSIS — M2011 Hallux valgus (acquired), right foot: Secondary | ICD-10-CM

## 2020-12-05 DIAGNOSIS — B351 Tinea unguium: Secondary | ICD-10-CM | POA: Diagnosis not present

## 2020-12-05 DIAGNOSIS — M2012 Hallux valgus (acquired), left foot: Secondary | ICD-10-CM

## 2020-12-09 ENCOUNTER — Encounter: Payer: Self-pay | Admitting: Podiatry

## 2020-12-09 NOTE — Progress Notes (Signed)
Subjective: Patricia Petersen presents today for follow up of preventative diabetic foot care and painful mycotic nails b/l that are difficult to trim. Pain interferes with ambulation. Aggravating factors include wearing enclosed shoe gear. Pain is relieved with periodic professional debridement.   PCP is Dr. Lajean Manes.  Allergies  Allergen Reactions  . Arthro-Complex [Nutritional Supplements]   . Levaquin [Levofloxacin In D5w]   . Lipitor [Atorvastatin]   . Penicillins      Objective: There were no vitals filed for this visit.  Pt 87 y.o. year old Caucasian female  in NAD. AAO x 3.   Vascular Examination:  Capillary refill time to digits immediate b/l. Palpable DP pulses b/l. Palpable PT pulses b/l. Pedal hair absent b/l Skin temperature gradient within normal limits b/l.  Dermatological Examination: Pedal skin with normal turgor, texture and tone bilaterally. No open wounds bilaterally. No interdigital macerations bilaterally. Toenails 1-5 b/l elongated, discolored, dystrophic, thickened, crumbly with subungual debris and tenderness to dorsal palpation. Hyperkeratotic lesion(s) L hallux, L 3rd toe, R hallux and R 2nd toe.  No erythema, no edema, no drainage, no flocculence.  Musculoskeletal: Normal muscle strength 5/5 to all lower extremity muscle groups bilaterally, no pain crepitus or joint limitation noted with ROM b/l and bunion deformity noted b/l  Neurological: Protective sensation intact 5/5 intact bilaterally with 10g monofilament b/l Vibratory sensation intact b/l Babinski reflex negative b/l Achilles reflex 2+ b/l Clonus negative b/l  Assessment: 1. Pain due to onychomycosis of toenails of both feet   2. Corns and callosities   3. Hallux valgus, acquired, bilateral   4. Controlled type 2 diabetes mellitus without complication, without long-term current use of insulin ([place])    Plan: -Examined patient. -No new findings. No new orders. -Continue diabetic foot care  principles. -Toenails 1-5 b/l were debrided in length and girth with sterile nail nippers and dremel without iatrogenic bleeding.  -Corn(s) L 3rd toe and R 2nd toe and callus(es) L hallux and R hallux were pared utilizing sterile scalpel blade without incident. Total number debrided =4. -Patient to report any pedal injuries to medical professional immediately. -Patient/POA to call should there be question/concern in the interim.  Return in about 3 months (around 03/04/2021).

## 2021-03-20 ENCOUNTER — Encounter: Payer: Self-pay | Admitting: Podiatry

## 2021-03-20 ENCOUNTER — Other Ambulatory Visit: Payer: Self-pay

## 2021-03-20 ENCOUNTER — Ambulatory Visit: Payer: Medicare Other | Admitting: Podiatry

## 2021-03-20 DIAGNOSIS — E119 Type 2 diabetes mellitus without complications: Secondary | ICD-10-CM | POA: Diagnosis not present

## 2021-03-20 DIAGNOSIS — M79674 Pain in right toe(s): Secondary | ICD-10-CM | POA: Diagnosis not present

## 2021-03-20 DIAGNOSIS — E7800 Pure hypercholesterolemia, unspecified: Secondary | ICD-10-CM | POA: Insufficient documentation

## 2021-03-20 DIAGNOSIS — N950 Postmenopausal bleeding: Secondary | ICD-10-CM | POA: Insufficient documentation

## 2021-03-20 DIAGNOSIS — M79675 Pain in left toe(s): Secondary | ICD-10-CM

## 2021-03-20 DIAGNOSIS — E1142 Type 2 diabetes mellitus with diabetic polyneuropathy: Secondary | ICD-10-CM | POA: Insufficient documentation

## 2021-03-20 DIAGNOSIS — I10 Essential (primary) hypertension: Secondary | ICD-10-CM | POA: Insufficient documentation

## 2021-03-20 DIAGNOSIS — B351 Tinea unguium: Secondary | ICD-10-CM | POA: Diagnosis not present

## 2021-03-20 DIAGNOSIS — L84 Corns and callosities: Secondary | ICD-10-CM | POA: Diagnosis not present

## 2021-03-20 DIAGNOSIS — E113293 Type 2 diabetes mellitus with mild nonproliferative diabetic retinopathy without macular edema, bilateral: Secondary | ICD-10-CM | POA: Insufficient documentation

## 2021-03-20 DIAGNOSIS — I872 Venous insufficiency (chronic) (peripheral): Secondary | ICD-10-CM | POA: Insufficient documentation

## 2021-03-20 DIAGNOSIS — Z6837 Body mass index (BMI) 37.0-37.9, adult: Secondary | ICD-10-CM | POA: Insufficient documentation

## 2021-03-20 HISTORY — DX: Type 2 diabetes mellitus with diabetic polyneuropathy: E11.42

## 2021-03-24 NOTE — Progress Notes (Signed)
Subjective: Patricia Petersen is a pleasant 87 y.o. female patient seen today painful thick toenails that are difficult to trim. Pain interferes with ambulation. Aggravating factors include wearing enclosed shoe gear. Pain is relieved with periodic professional debridement.  She states she has been soaking her feet as she thought she was instructed to do so on last visit.  PCP is Lajean Manes, MD. Last visit was: five months ago..  Allergies  Allergen Reactions  . Arthro-Complex [Nutritional Supplements]   . Empagliflozin Other (See Comments)  . Levaquin [Levofloxacin In D5w]   . Lipitor [Atorvastatin]   . Metformin Hcl Other (See Comments)  . Penicillins   . Terbinafine Other (See Comments)    Objective: Physical Exam  General: Patricia Petersen is a pleasant 87 y.o. Caucasian female, morbidly obese in NAD. AAO x 3.   Vascular:  Capillary refill time to digits immediate b/l. Palpable pedal pulses b/l LE. Pedal hair present. Lower extremity skin temperature gradient within normal limits. No pain with calf compression b/l.  Dermatological:  Pedal skin with normal turgor, texture and tone bilaterally. No open wounds bilaterally. No interdigital macerations bilaterally. Toenails 1-5 b/l elongated, discolored, dystrophic, thickened, crumbly with subungual debris and tenderness to dorsal palpation. Hyperkeratotic lesion(s) L hallux, L 3rd toe, R hallux and R 2nd toe.  No erythema, no edema, no drainage, no fluctuance.  Musculoskeletal:  Normal muscle strength 5/5 to all lower extremity muscle groups bilaterally. No pain crepitus or joint limitation noted with ROM b/l. Hallux valgus with bunion deformity noted b/l lower extremities.  Neurological:  Protective sensation intact 5/5 intact bilaterally with 10g monofilament b/l.  Assessment and Plan:  1. Pain due to onychomycosis of toenails of both feet   2. Corns and callosities   3. Controlled type 2 diabetes mellitus without  complication, without long-term current use of insulin ([place])     -Examined patient. -Patient to continue soft, supportive shoe gear daily. -Patient instructed to continue topical antifungal solution to toenails once daily from [place] 1-5 b/l were debrided in length and girth with sterile nail nippers and dremel without iatrogenic bleeding.  -Corn(s) L 3rd toe and R 2nd toe and callus(es) L hallux and R hallux were pared utilizing sterile scalpel blade without incident. Total number debrided =4. -Patient to report any pedal injuries to medical professional immediately. -Patient was instructed to discontinue foot soaks. -Patient/POA to call should there be question/concern in the interim.  Return in about 3 months (around 06/20/2021).  Marzetta Board, DPM

## 2021-06-26 ENCOUNTER — Ambulatory Visit: Payer: Medicare Other | Admitting: Podiatry

## 2021-08-21 ENCOUNTER — Other Ambulatory Visit: Payer: Self-pay

## 2021-08-21 ENCOUNTER — Ambulatory Visit: Payer: Medicare Other | Admitting: Podiatry

## 2021-08-21 DIAGNOSIS — E119 Type 2 diabetes mellitus without complications: Secondary | ICD-10-CM | POA: Diagnosis not present

## 2021-08-21 DIAGNOSIS — B351 Tinea unguium: Secondary | ICD-10-CM | POA: Diagnosis not present

## 2021-08-21 DIAGNOSIS — M79674 Pain in right toe(s): Secondary | ICD-10-CM | POA: Diagnosis not present

## 2021-08-21 DIAGNOSIS — M79675 Pain in left toe(s): Secondary | ICD-10-CM | POA: Diagnosis not present

## 2021-08-21 DIAGNOSIS — L84 Corns and callosities: Secondary | ICD-10-CM | POA: Diagnosis not present

## 2021-08-26 ENCOUNTER — Encounter: Payer: Self-pay | Admitting: Podiatry

## 2021-08-26 NOTE — Progress Notes (Signed)
  Subjective:  Patient ID: Patricia Petersen, female    DOB: [birth year],  MRN: [id number]  Patricia Petersen presents to clinic today for preventative diabetic foot care and corn(s) b/l feet , callus(es) b/l feet and painful mycotic nails.  Pain interferes with ambulation. Aggravating factors include wearing enclosed shoe gear. Painful toenails interfere with ambulation. Aggravating factors include wearing enclosed shoe gear. Pain is relieved with periodic professional debridement. Painful corns and calluses are aggravated when weightbearing with and without shoegear. Pain is relieved with periodic professional debridement.  Patient states blood glucose was 127 mg/dl on yesterday morning.  Patient did not check blood glucose today.  She relates no new pedal problems on today's visit.  PCP is Lajean Manes, MD , and last visit was February, 2022.  Allergies  Allergen Reactions   Arthro-Complex [Nutritional Supplements]    Empagliflozin Other (See Comments)   Levaquin [Levofloxacin In D5w]    Lipitor [Atorvastatin]    Metformin Hcl Other (See Comments)   Penicillins    Terbinafine Other (See Comments)    Review of Systems: Negative except as noted in the HPI. Objective:   Constitutional Patricia Petersen is a pleasant 87 y.o. Caucasian female, morbidly obese in NAD. AAO x 3.   Vascular CFT immediate b/l LE. Palpable DP/PT pulses b/l LE. Digital hair present b/l. Skin temperature gradient WNL b/l. No pain with calf compression b/l. No edema noted b/l. Pedal hair present. Lower extremity skin temperature gradient within normal limits. No cyanosis or clubbing noted.  Neurologic Normal speech. Oriented to person, place, and time. Protective sensation intact 5/5 intact bilaterally with 10g monofilament b/l.  Dermatologic Pedal skin is warm and supple b/l LE. No open wounds b/l LE. No interdigital macerations noted b/l LE. Toenails 1-5 b/l elongated, discolored, dystrophic, thickened, crumbly with  subungual debris and tenderness to dorsal palpation. Hyperkeratotic lesion(s) L hallux, L 3rd toe, R hallux, and R 2nd toe.  No erythema, no edema, no drainage, no fluctuance.  Orthopedic: Normal muscle strength 5/5 to all lower extremity muscle groups bilaterally. HAV with bunion deformity noted b/l LE.   Radiographs: None Assessment:   1. Pain due to onychomycosis of toenails of both feet   2. Corns and callosities   3. Controlled type 2 diabetes mellitus without complication, without long-term current use of insulin ([place])    Plan:  Patient was evaluated and treated and all questions answered. Consent given for treatment as described below: -No new findings. No new orders. -Continue diabetic foot care principles: inspect feet daily, monitor glucose as recommended by PCP and/or Endocrinologist, and follow prescribed diet per PCP, Endocrinologist and/or dietician. -Toenails 1-5 b/l were debrided in length and girth with sterile nail nippers and dremel without iatrogenic bleeding.  -Corn(s) L 3rd toe and R 2nd toe and callus(es) L hallux and R hallux were pared utilizing sterile scalpel blade without incident. Total number debrided =4. -Patient/POA to call should there be question/concern in the interim.  Return in about 3 months (around 11/21/2021).  Marzetta Board, DPM

## 2021-10-10 ENCOUNTER — Ambulatory Visit: Payer: Medicare Other | Admitting: Orthopedic Surgery

## 2021-10-11 ENCOUNTER — Ambulatory Visit: Payer: Medicare Other | Admitting: Orthopedic Surgery

## 2021-10-17 ENCOUNTER — Other Ambulatory Visit: Payer: Self-pay

## 2021-10-17 ENCOUNTER — Encounter: Payer: Self-pay | Admitting: Orthopedic Surgery

## 2021-10-17 ENCOUNTER — Ambulatory Visit: Payer: Self-pay

## 2021-10-17 ENCOUNTER — Ambulatory Visit (INDEPENDENT_AMBULATORY_CARE_PROVIDER_SITE_OTHER): Payer: Medicare Other | Admitting: Orthopedic Surgery

## 2021-10-17 DIAGNOSIS — M79641 Pain in right hand: Secondary | ICD-10-CM | POA: Diagnosis not present

## 2021-10-17 DIAGNOSIS — M79642 Pain in left hand: Secondary | ICD-10-CM | POA: Diagnosis not present

## 2021-10-17 NOTE — Progress Notes (Signed)
Office Visit Note   Patient: Patricia Petersen           Date of Birth: July 14, 1934           MRN: [id number] Visit Date: 10/17/2021              Requested by: Lajean Manes, MD [address] [place],  [place] [address] PCP: Lajean Manes, MD   Assessment & Plan: Visit Diagnoses:  1. Pain in right hand   2. Pain in left hand     Plan: Discussed with patient that the small nodules in her palm may be related to Dupuytren's disease like her father had, but is not bad enough to cause concern.  Discussed the table top test and unpredictable nature of Dupuytren progression.  Her finger tip numbness may be related to early carpal tunnel syndrome but could also be related to neuropathy given her diabetes.  She has some mild stiffness of her fingers which is likely related to her osteoarthritis.  Discussed arthritis treatment including oral NSAIDs as tolerated.  I can see her back as needed.   Follow-Up Instructions: No follow-ups on file.   Orders:  Orders Placed This Encounter  Procedures   XR Hand Complete Right   XR Hand Complete Left   No orders of the defined types were placed in this encounter.     Procedures: No procedures performed   Clinical Data: No additional findings.   Subjective: Chief Complaint  Patient presents with   Right Hand - New Patient (Initial Visit)   Left Hand - New Patient (Initial Visit)    This is an 87 year old right-hand-dominant female who presents with multiple complaints involving bilateral hands.  She describes nodules of the palm of her right hand in line with the ring finger ray.  He is not changed in size or character but patient notes that her dad had Dupuytren's disease.  He had significant contractures that required surgery.  She wants to make sure that this is not worsening.  She also describes some numbness in the tips of her thumb, index, and middle fingers.  This only involves the tips of these fingers.  She has  occasional symptoms where she gets hand pain but this goes away spontaneously soon as she wakes up.  She also describes some mild stiffness in both of her hands.   Review of Systems   Objective: Vital Signs: There were no vitals taken for this visit.  Physical Exam Constitutional:      Appearance: Normal appearance.  Cardiovascular:     Rate and Rhythm: Normal rate.     Pulses: Normal pulses.  Pulmonary:     Effort: Pulmonary effort is normal.  Skin:    General: Skin is warm and dry.     Capillary Refill: Capillary refill takes less than 2 seconds.  Neurological:     Mental Status: She is alert.    Right Hand Exam   Tenderness  The patient is experiencing no tenderness.   Other  Erythema: absent Sensation: normal Pulse: present  Comments:  Small, palpable subcutaneous nodules in palm around ring finger.  No contractures.  Negative provocative carpal tunnel signs.  Able to make near complete fist with some stiffness.    Left Hand Exam   Tenderness  The patient is experiencing no tenderness.   Other  Erythema: absent Sensation: normal Pulse: present  Comments:  Negative provocative carpal tunnel signs.  Able to make near complete fist with  some stiffness.       Specialty Comments:  No specialty comments available.  Imaging: 3 views of bilateral hands taken today reviewed interpreted by me.  They demonstrate scattered degenerative changes involving multiple IP joints.  She has osteoarthritis involving the thumb CMC joint with joint space narrowing osteophytes.   PMFS History: Patient Active Problem List   Diagnosis Date Noted   Body mass index (BMI) 37.0-37.9, adult 03/20/2021   Essential hypertension 03/20/2021   Hypercholesterolemia 03/20/2021   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 with type 2 diabetes mellitus ([place]) 03/20/2021   Morbid obesity ([place]) 03/20/2021   Peripheral venous insufficiency  03/20/2021   Polyneuropathy due to type 2 diabetes mellitus ([place]) 03/20/2021   Postmenopausal bleeding 03/20/2021   Past Medical History:  Diagnosis Date   Diabetes ([place])    HLD (hyperlipidemia)    HTN (hypertension)    Hypercholesteremia    Low back pain    Obesities, morbid ([place])     Family History  Problem Relation Age of Onset   Heart disease Other     Past Surgical History:  Procedure Laterality Date   APPENDECTOMY     TONSILLECTOMY     Social History   Occupational History   Not on file  Tobacco Use   Smoking status: Never   Smokeless tobacco: Never  Substance and Sexual Activity   Alcohol use: Not on file   Drug use: Not on file   Sexual activity: Not on file

## 2021-11-07 DIAGNOSIS — E1142 Type 2 diabetes mellitus with diabetic polyneuropathy: Secondary | ICD-10-CM | POA: Diagnosis not present

## 2021-11-07 DIAGNOSIS — E1165 Type 2 diabetes mellitus with hyperglycemia: Secondary | ICD-10-CM | POA: Diagnosis not present

## 2021-11-07 DIAGNOSIS — E7800 Pure hypercholesterolemia, unspecified: Secondary | ICD-10-CM | POA: Diagnosis not present

## 2021-11-07 DIAGNOSIS — I10 Essential (primary) hypertension: Secondary | ICD-10-CM | POA: Diagnosis not present

## 2021-11-29 ENCOUNTER — Ambulatory Visit: Payer: Medicare Other | Admitting: Podiatry

## 2021-12-09 ENCOUNTER — Ambulatory Visit: Payer: Medicare Other | Admitting: Podiatry

## 2021-12-09 ENCOUNTER — Other Ambulatory Visit: Payer: Self-pay

## 2021-12-09 DIAGNOSIS — B351 Tinea unguium: Secondary | ICD-10-CM | POA: Diagnosis not present

## 2021-12-09 DIAGNOSIS — E119 Type 2 diabetes mellitus without complications: Secondary | ICD-10-CM | POA: Diagnosis not present

## 2021-12-09 DIAGNOSIS — M2041 Other hammer toe(s) (acquired), right foot: Secondary | ICD-10-CM | POA: Diagnosis not present

## 2021-12-09 DIAGNOSIS — M2011 Hallux valgus (acquired), right foot: Secondary | ICD-10-CM | POA: Diagnosis not present

## 2021-12-09 DIAGNOSIS — L84 Corns and callosities: Secondary | ICD-10-CM | POA: Diagnosis not present

## 2021-12-09 DIAGNOSIS — I358 Other nonrheumatic aortic valve disorders: Secondary | ICD-10-CM

## 2021-12-09 DIAGNOSIS — M2012 Hallux valgus (acquired), left foot: Secondary | ICD-10-CM

## 2021-12-09 DIAGNOSIS — E1165 Type 2 diabetes mellitus with hyperglycemia: Secondary | ICD-10-CM | POA: Insufficient documentation

## 2021-12-09 DIAGNOSIS — M79675 Pain in left toe(s): Secondary | ICD-10-CM | POA: Diagnosis not present

## 2021-12-09 DIAGNOSIS — M79674 Pain in right toe(s): Secondary | ICD-10-CM

## 2021-12-09 DIAGNOSIS — M2042 Other hammer toe(s) (acquired), left foot: Secondary | ICD-10-CM | POA: Diagnosis not present

## 2021-12-09 DIAGNOSIS — E113293 Type 2 diabetes mellitus with mild nonproliferative diabetic retinopathy without macular edema, bilateral: Secondary | ICD-10-CM | POA: Insufficient documentation

## 2021-12-09 HISTORY — DX: Other nonrheumatic aortic valve disorders: I35.8

## 2021-12-09 NOTE — Patient Instructions (Signed)
Apply tea tree oil to toenails once daily.

## 2021-12-15 ENCOUNTER — Encounter: Payer: Self-pay | Admitting: Podiatry

## 2021-12-15 NOTE — Progress Notes (Signed)
ANNUAL DIABETIC FOOT EXAM  Subjective: Patricia Petersen presents today for for annual diabetic foot examination.  Patient denies any h/o foot wounds.  Patient denies any numbness, tingling, burning, or pins/needle sensation in feet.  Patient did not check blood glucose this morning.  Risk factors: diabetic neuropathy, hyperlipidemia, HTN.  Lajean Manes, MD is patient's PCP. Last visit was October 08, 2021.  Past Medical History:  Diagnosis Date   Diabetes ([place])    HLD (hyperlipidemia)    HTN (hypertension)    Hypercholesteremia    Low back pain    Obesities, morbid ([place])    Patient Active Problem List   Diagnosis Date Noted   Aortic valve sclerosis 12/09/2021   Hyperglycemia due to diabetes mellitus ([place]) 12/09/2021   Type 2 diabetes mellitus with mild nonproliferative diabetic retinopathy without macular edema, bilateral ([facility]) 12/09/2021   Body mass index (BMI) 37.0-37.9, adult 03/20/2021   Essential hypertension 03/20/2021   Hypercholesterolemia 03/20/2021   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 with type 2 diabetes mellitus ([place]) 03/20/2021   Morbid obesity ([place]) 03/20/2021   Peripheral venous insufficiency 03/20/2021   Polyneuropathy due to type 2 diabetes mellitus ([place]) 03/20/2021   Postmenopausal bleeding 03/20/2021   Past Surgical History:  Procedure Laterality Date   APPENDECTOMY     TONSILLECTOMY     Current Outpatient Medications on File Prior to Visit  Medication Sig Dispense Refill   ACCU-CHEK GUIDE test strip chheck blood sugars ONCE daily, alternating morning AND evening     Accu-Chek Softclix Lancets lancets daily.     acetaminophen (TYLENOL) 500 MG tablet 2 tabs as needed     aspirin 81 MG tablet Take 81 mg by mouth daily.     Aspirin-Calcium Carbonate 81-777 MG TABS Take by mouth.     atorvastatin (LIPITOR) 20 MG tablet Take 20 mg by mouth daily.     atorvastatin (LIPITOR) 20 MG tablet       betamethasone dipropionate 0.05 % cream      Blood Glucose Monitoring Suppl (ACCU-CHEK GUIDE) w/Device KIT Check glucose levels ONCE a DAY     cholecalciferol (VITAMIN D) 1000 UNITS tablet Take 1,000 Units by mouth daily.     clotrimazole (LOTRIMIN) 1 % cream 1 application     clotrimazole (LOTRIMIN) 1 % cream      clotrimazole-betamethasone (LOTRISONE) cream      diclofenac (VOLTAREN) 75 MG EC tablet TAKE 1 TABLET BY MOUTH TWICE DAILY.     diclofenac (VOLTAREN) 75 MG EC tablet      Difluprednate (DUREZOL) 0.05 % EMUL      diltiazem (CARDIZEM CD) 180 MG 24 hr capsule Take 1 capsule (180 mg total) by mouth daily. 30 capsule 5   diltiazem (TIAZAC) 180 MG 24 hr capsule Take by mouth.     estradiol (ESTRACE) 0.1 MG/GM vaginal cream      fluconazole (DIFLUCAN) 200 MG tablet      furosemide (LASIX) 40 MG tablet Take by mouth.     glipiZIDE (GLUCOTROL XL) 2.5 MG 24 hr tablet Take 2.5 mg by mouth every morning.     JARDIANCE 10 MG TABS tablet Take 10 mg by mouth daily.     metFORMIN (GLUCOPHAGE) 1000 MG tablet Take by mouth.     metFORMIN (GLUCOPHAGE-XR) 500 MG 24 hr tablet Take 500 mg by mouth 2 (two) times daily.     olmesartan-hydrochlorothiazide (BENICAR HCT) 40-25 MG per tablet Take 1 tablet by mouth daily. 30 tablet  6   potassium chloride SA (KLOR-CON) 20 MEQ tablet Take by mouth.     simvastatin (ZOCOR) 20 MG tablet Take 20 mg by mouth every evening.     No current facility-administered medications on file prior to visit.    Allergies  Allergen Reactions   Arthro-Complex [Nutritional Supplements]    Empagliflozin Other (See Comments)    Other reaction(s): yeast infection   Levaquin [Levofloxacin In D5w]    Levofloxacin Other (See Comments)   Lipitor [Atorvastatin]    Metformin Hcl Other (See Comments)    Other reaction(s): stomach cramps   Penicillin G     Other reaction(s): throat closing up   Penicillins    Terbinafine Other (See Comments)    Other reaction(s): Unknown    Social History   Occupational History   Not on file  Tobacco Use   Smoking status: Never   Smokeless tobacco: Never  Substance and Sexual Activity   Alcohol use: Not on file   Drug use: Not on file   Sexual activity: Not on file   Family History  Problem Relation Age of Onset   Heart disease Other    Immunization History  Administered Date(s) Administered   Influenza Split 07/26/2009, 07/31/2010, 08/13/2011, 09/02/2012, 09/20/2013, 08/04/2019, 08/24/2020   Influenza,inj,Quad PF,6+ Mos 07/29/2017   Moderna Sars-Covid-2 Vaccination 11/22/2019, 12/06/2019, 08/31/2020   Pneumococcal Conjugate-13 10/02/2014   Pneumococcal Polysaccharide-23 03/21/2009, 04/22/2017   Td 03/21/2009   Tdap 08/18/2019   Zoster, Live 03/15/2013, 09/13/2019, 03/06/2020     Review of Systems: Negative except as noted in the HPI.   Objective: There were no vitals filed for this visit.  Patricia Petersen is a pleasant 88 y.o. female in NAD. AAO X 3.  Vascular Examination: Capillary refill time to digits immediate b/l. Palpable pedal pulses b/l LE. Pedal hair present. No pain with calf compression b/l. Lower extremity skin temperature gradient within normal limits. Trace edema noted BLE. Varicosities present b/l. Evidence of chronic venous insufficiency b/l LE. No cyanosis or clubbing noted b/l LE.  Dermatological Examination: Pedal integument with normal turgor, texture and tone BLE. No open wounds b/l LE. No interdigital macerations noted b/l LE. Toenails 1-5 b/l elongated, discolored, dystrophic, thickened, crumbly with subungual debris and tenderness to dorsal palpation. Hyperkeratotic lesion(s) R 3rd toe.  No erythema, no edema, no drainage, no fluctuance.  Musculoskeletal Examination: Muscle strength 5/5 to all lower extremity muscle groups bilaterally. HAV with bunion deformity noted b/l LE. Hammertoe deformity noted 2-5 b/l.  Footwear Assessment: Does the patient wear appropriate shoes?  Yes. Does the patient need inserts/orthotics? Yes.  Neurological Examination: Protective sensation intact 5/5 intact bilaterally with 10g monofilament b/l. Vibratory sensation intact b/l.  No flowsheet data found.  Assessment: 1. Pain due to onychomycosis of toenails of both feet   2. Corns   3. Hallux valgus, acquired, bilateral   4. Acquired hammertoes of both feet   5. Controlled type 2 diabetes mellitus without complication, without long-term current use of insulin ([place])   6. Encounter for diabetic foot exam ([place])     ADA Risk Categorization: Low Risk :  Patient has all of the following: Intact protective sensation No prior foot ulcer  No severe deformity Pedal pulses present  Plan: -Diabetic foot examination performed today. -Continue foot and shoe inspections daily. Monitor blood glucose per PCP/Endocrinologist's recommendations. -Patient instructed to continue use of tea tree oil  to affected toenails once daily. -Mycotic toenails 1-5 bilaterally were debrided in length and girth with  sterile nail nippers and dremel without incident. -Corn(s) R 3rd toe pared utilizing sterile scalpel blade without complication or incident. Total number debrided=1. -Patient/POA to call should there be question/concern in the interim.  Return in about 3 months (around 03/08/2022).  Marzetta Board, DPM

## 2021-12-24 DIAGNOSIS — E1142 Type 2 diabetes mellitus with diabetic polyneuropathy: Secondary | ICD-10-CM | POA: Diagnosis not present

## 2021-12-24 DIAGNOSIS — E7800 Pure hypercholesterolemia, unspecified: Secondary | ICD-10-CM | POA: Diagnosis not present

## 2021-12-24 DIAGNOSIS — E1165 Type 2 diabetes mellitus with hyperglycemia: Secondary | ICD-10-CM | POA: Diagnosis not present

## 2021-12-24 DIAGNOSIS — I10 Essential (primary) hypertension: Secondary | ICD-10-CM | POA: Diagnosis not present

## 2022-01-23 DIAGNOSIS — E7800 Pure hypercholesterolemia, unspecified: Secondary | ICD-10-CM | POA: Diagnosis not present

## 2022-01-23 DIAGNOSIS — I10 Essential (primary) hypertension: Secondary | ICD-10-CM | POA: Diagnosis not present

## 2022-01-23 DIAGNOSIS — E1165 Type 2 diabetes mellitus with hyperglycemia: Secondary | ICD-10-CM | POA: Diagnosis not present

## 2022-02-13 DIAGNOSIS — I10 Essential (primary) hypertension: Secondary | ICD-10-CM | POA: Diagnosis not present

## 2022-02-13 DIAGNOSIS — E1165 Type 2 diabetes mellitus with hyperglycemia: Secondary | ICD-10-CM | POA: Diagnosis not present

## 2022-02-13 DIAGNOSIS — E7800 Pure hypercholesterolemia, unspecified: Secondary | ICD-10-CM | POA: Diagnosis not present

## 2022-03-05 DIAGNOSIS — E7800 Pure hypercholesterolemia, unspecified: Secondary | ICD-10-CM | POA: Diagnosis not present

## 2022-03-05 DIAGNOSIS — I10 Essential (primary) hypertension: Secondary | ICD-10-CM | POA: Diagnosis not present

## 2022-03-05 DIAGNOSIS — E1165 Type 2 diabetes mellitus with hyperglycemia: Secondary | ICD-10-CM | POA: Diagnosis not present

## 2022-03-10 ENCOUNTER — Encounter: Payer: Self-pay | Admitting: Podiatry

## 2022-03-10 ENCOUNTER — Ambulatory Visit: Payer: Medicare Other | Admitting: Podiatry

## 2022-03-10 DIAGNOSIS — M79675 Pain in left toe(s): Secondary | ICD-10-CM

## 2022-03-10 DIAGNOSIS — L84 Corns and callosities: Secondary | ICD-10-CM | POA: Diagnosis not present

## 2022-03-10 DIAGNOSIS — B351 Tinea unguium: Secondary | ICD-10-CM

## 2022-03-10 DIAGNOSIS — E119 Type 2 diabetes mellitus without complications: Secondary | ICD-10-CM

## 2022-03-10 DIAGNOSIS — M79674 Pain in right toe(s): Secondary | ICD-10-CM | POA: Diagnosis not present

## 2022-03-16 NOTE — Progress Notes (Signed)
  Subjective:  Patient ID: Patricia Petersen, female    DOB: Oct 14, 1934,  MRN: [id number]  [name] presents to clinic today for corn(s) R 3rd toe and painful thick toenails that are difficult to trim. Painful toenails interfere with ambulation. Aggravating factors include wearing enclosed shoe gear. Pain is relieved with periodic professional debridement. Painful corns are aggravated when weightbearing when wearing enclosed shoe gear. Pain is relieved with periodic professional debridement.  PCP is Lajean Manes, MD , and last visit was Feb 24, 2022.  Allergies  Allergen Reactions   Arthro-Complex [Nutritional Supplements]    Empagliflozin Other (See Comments)    Other reaction(s): yeast infection   Levaquin [Levofloxacin In D5w]    Levofloxacin Other (See Comments)   Lipitor [Atorvastatin]    Metformin Hcl Other (See Comments)    Other reaction(s): stomach cramps   Penicillin G     Other reaction(s): throat closing up   Penicillins    Terbinafine Other (See Comments)    Other reaction(s): Unknown    Review of Systems: Negative except as noted in the HPI.  Objective: No changes noted in today's physical examination.  There were no vitals filed for this visit.  [name] is a pleasant 88 y.o. female in NAD. AAO X 3.  Vascular Examination: Capillary refill time to digits immediate b/l. Palpable pedal pulses b/l LE. Pedal hair present. No pain with calf compression b/l. Lower extremity skin temperature gradient within normal limits. Trace edema noted BLE. Varicosities present b/l. Evidence of chronic venous insufficiency b/l LE. No cyanosis or clubbing noted b/l LE.  Dermatological Examination: Pedal integument with normal turgor, texture and tone BLE. No open wounds b/l LE. No interdigital macerations noted b/l LE. Toenails 1-5 b/l elongated, discolored, dystrophic, thickened, crumbly with subungual debris and tenderness to dorsal palpation. Hyperkeratotic lesion(s) R 3rd  toe with subdermal hemorrhage.  No erythema, no edema, no drainage, no fluctuance.  Musculoskeletal Examination: Muscle strength 5/5 to all lower extremity muscle groups bilaterally. HAV with bunion deformity noted b/l LE. Hammertoe deformity noted 2-5 b/l.  Neurological Examination: Protective sensation intact 5/5 intact bilaterally with 10g monofilament b/l. Vibratory sensation intact b/l.  Assessment/Plan: 1. Pain due to onychomycosis of toenails of both feet   2. Pre-ulcerative corn or callous   3. Controlled type 2 diabetes mellitus without complication, without long-term current use of insulin ([place])     -Patient was evaluated and treated. All patient's and/or POA's questions/concerns answered on today's visit. -Continue diabetic foot care principles: inspect feet daily, monitor glucose as recommended by PCP and/or Endocrinologist, and follow prescribed diet per PCP, Endocrinologist and/or dietician. -Preulcerative lesion pared R 3rd toe. Total number pared=1. Patient instructed to apply Neosporin to digit once daily for 7 days. Call office if there are any concerns. -Patient/POA to call should there be question/concern in the interim.   Return in about 3 months (around 06/10/2022).  Marzetta Board, DPM

## 2022-03-27 DIAGNOSIS — I10 Essential (primary) hypertension: Secondary | ICD-10-CM | POA: Diagnosis not present

## 2022-03-27 DIAGNOSIS — E1142 Type 2 diabetes mellitus with diabetic polyneuropathy: Secondary | ICD-10-CM | POA: Diagnosis not present

## 2022-03-27 DIAGNOSIS — E7800 Pure hypercholesterolemia, unspecified: Secondary | ICD-10-CM | POA: Diagnosis not present

## 2022-04-21 DIAGNOSIS — E11319 Type 2 diabetes mellitus with unspecified diabetic retinopathy without macular edema: Secondary | ICD-10-CM | POA: Diagnosis not present

## 2022-04-21 DIAGNOSIS — E1142 Type 2 diabetes mellitus with diabetic polyneuropathy: Secondary | ICD-10-CM | POA: Diagnosis not present

## 2022-04-21 DIAGNOSIS — I10 Essential (primary) hypertension: Secondary | ICD-10-CM | POA: Diagnosis not present

## 2022-04-21 DIAGNOSIS — E113293 Type 2 diabetes mellitus with mild nonproliferative diabetic retinopathy without macular edema, bilateral: Secondary | ICD-10-CM | POA: Diagnosis not present

## 2022-06-16 ENCOUNTER — Ambulatory Visit: Payer: Medicare Other | Admitting: Podiatry

## 2022-06-16 ENCOUNTER — Encounter: Payer: Self-pay | Admitting: Podiatry

## 2022-06-16 DIAGNOSIS — E119 Type 2 diabetes mellitus without complications: Secondary | ICD-10-CM

## 2022-06-16 DIAGNOSIS — L84 Corns and callosities: Secondary | ICD-10-CM | POA: Diagnosis not present

## 2022-06-16 DIAGNOSIS — M79674 Pain in right toe(s): Secondary | ICD-10-CM

## 2022-06-16 DIAGNOSIS — M79675 Pain in left toe(s): Secondary | ICD-10-CM

## 2022-06-16 DIAGNOSIS — B351 Tinea unguium: Secondary | ICD-10-CM

## 2022-06-23 NOTE — Progress Notes (Signed)
  Subjective:  Patient ID: Patricia Petersen, female    DOB: August 03, 1934,  MRN: [id number]  Patricia Petersen presents to clinic today for preventative diabetic foot care and preulcerative lesion(s) left lower extremity and painful mycotic toenails that limit ambulation. Painful toenails interfere with ambulation. Aggravating factors include wearing enclosed shoe gear. Pain is relieved with periodic professional debridement. Painful porokeratotic lesions are aggravated when weightbearing with and without shoegear. Pain is relieved with periodic professional debridement.  Patient states blood glucose was 118 mg/dl today. Last known HgA1c was unknown.  PCP is Lajean Manes, MD , and last visit was April 21, 2022.  Allergies  Allergen Reactions   Arthro-Complex [Nutritional Supplements]    Empagliflozin Other (See Comments)    Other reaction(s): yeast infection   Levaquin [Levofloxacin In D5w]    Levofloxacin Other (See Comments)   Lipitor [Atorvastatin]    Metformin Hcl Other (See Comments)    Other reaction(s): stomach cramps   Penicillin G     Other reaction(s): throat closing up   Penicillins    Terbinafine Other (See Comments)    Other reaction(s): Unknown   Review of Systems: Negative except as noted in the HPI.  Objective: No changes noted in today's physical examination.  Vascular Examination: Palpable pedal pulses b/l LE. Digital hair present b/l. No pedal edema b/l. Skin temperature gradient WNL b/l. Varicosities present b/l. Evidence of chronic venous insufficiency b/l LE. No cyanosis or clubbing noted b/l LE.Marland Kitchen  Dermatological Examination: Pedal skin with normal turgor, texture and tone b/l. No open wounds. No interdigital macerations b/l. Toenails 1-5 b/l thickened, discolored, dystrophic with subungual debris. There is pain on palpation to dorsal aspect of nailplates. Preulcerative lesion noted distal tip of left 3rd toe. There is visible subdermal hemorrhage. There is no  surrounding erythema, no edema, no drainage, no odor, no fluctuance..  Neurological Examination: Protective sensation intact with 10 gram monofilament b/l LE. Vibratory sensation intact b/l LE.   Musculoskeletal Examination: Muscle strength 5/5 to all LE muscle groups b/l. HAV with bunion bilaterally and hammertoes 2-5 b/l.  Assessment/Plan: 1. Pain due to onychomycosis of toenails of both feet   2. Pre-ulcerative corn or callous   3. Controlled type 2 diabetes mellitus without complication, without long-term current use of insulin ([place])   -Examined patient. -No new findings. No new orders. -Mycotic toenails 1-5 bilaterally were debrided in length and girth with sterile nail nippers and dremel without incident. -Preulcerative lesion pared distal tip of left 2nd toe. Total number pared=1. -Patient/POA to call should there be question/concern in the interim.   Return in about 3 months (around 09/16/2022).  Marzetta Board, DPM

## 2022-07-09 DIAGNOSIS — M47816 Spondylosis without myelopathy or radiculopathy, lumbar region: Secondary | ICD-10-CM | POA: Diagnosis not present

## 2022-07-09 DIAGNOSIS — M1711 Unilateral primary osteoarthritis, right knee: Secondary | ICD-10-CM | POA: Diagnosis not present

## 2022-07-28 ENCOUNTER — Other Ambulatory Visit (HOSPITAL_BASED_OUTPATIENT_CLINIC_OR_DEPARTMENT_OTHER): Payer: Self-pay

## 2022-07-28 MED ORDER — FLUAD QUADRIVALENT 0.5 ML IM PRSY
PREFILLED_SYRINGE | INTRAMUSCULAR | 0 refills | Status: DC
  Filled 2022-07-28: qty 0.5, 1d supply, fill #0

## 2022-08-12 ENCOUNTER — Other Ambulatory Visit (HOSPITAL_BASED_OUTPATIENT_CLINIC_OR_DEPARTMENT_OTHER): Payer: Self-pay

## 2022-08-12 MED ORDER — COMIRNATY 30 MCG/0.3ML IM SUSY
PREFILLED_SYRINGE | INTRAMUSCULAR | 0 refills | Status: DC
  Filled 2022-08-12: qty 0.3, 1d supply, fill #0

## 2022-09-09 DIAGNOSIS — D225 Melanocytic nevi of trunk: Secondary | ICD-10-CM | POA: Diagnosis not present

## 2022-09-09 DIAGNOSIS — D2239 Melanocytic nevi of other parts of face: Secondary | ICD-10-CM | POA: Diagnosis not present

## 2022-09-09 DIAGNOSIS — Z85828 Personal history of other malignant neoplasm of skin: Secondary | ICD-10-CM | POA: Diagnosis not present

## 2022-09-09 DIAGNOSIS — L821 Other seborrheic keratosis: Secondary | ICD-10-CM | POA: Diagnosis not present

## 2022-09-30 ENCOUNTER — Ambulatory Visit (INDEPENDENT_AMBULATORY_CARE_PROVIDER_SITE_OTHER): Payer: Medicare Other | Admitting: Podiatry

## 2022-09-30 VITALS — BP 103/51

## 2022-09-30 DIAGNOSIS — B351 Tinea unguium: Secondary | ICD-10-CM | POA: Diagnosis not present

## 2022-09-30 DIAGNOSIS — M79675 Pain in left toe(s): Secondary | ICD-10-CM | POA: Diagnosis not present

## 2022-09-30 DIAGNOSIS — E119 Type 2 diabetes mellitus without complications: Secondary | ICD-10-CM

## 2022-09-30 DIAGNOSIS — L84 Corns and callosities: Secondary | ICD-10-CM

## 2022-09-30 DIAGNOSIS — M79674 Pain in right toe(s): Secondary | ICD-10-CM | POA: Diagnosis not present

## 2022-09-30 NOTE — Progress Notes (Signed)
  Subjective:  Patient ID: Patricia Petersen, female    DOB: 1934/02/22,  MRN: [id number]  LAMEEKA SCHLEIFER presents to clinic today for preventative diabetic foot care and preulcerative lesion(s) left lower extremity and painful mycotic toenails that limit ambulation. Painful toenails interfere with ambulation. Aggravating factors include wearing enclosed shoe gear. Pain is relieved with periodic professional debridement. Painful porokeratotic lesions are aggravated when weightbearing with and without shoegear. Pain is relieved with periodic professional debridement.  Chief Complaint  Patient presents with   Nail Problem    [facility] BS-121 A1C-do not know PCP-Dr.Raju PCP VST- have not met her yet   New problem(s): None.   PCP is Charlane Ferretti, MD.  Allergies  Allergen Reactions   Arthro-Complex [Nutritional Supplements]    Empagliflozin Other (See Comments)    Other reaction(s): yeast infection   Levaquin [Levofloxacin In D5w]    Levofloxacin Other (See Comments)   Lipitor [Atorvastatin]    Metformin Hcl Other (See Comments)    Other reaction(s): stomach cramps   Penicillin G     Other reaction(s): throat closing up   Penicillins    Terbinafine Other (See Comments)    Other reaction(s): Unknown    Review of Systems: Negative except as noted in the HPI.  Objective: No changes noted in today's physical examination. There were no vitals filed for this visit.  [name] is a pleasant 88 y.o. female morbidly obese in NAD. AAO x 3.  Vascular Examination: Palpable pedal pulses b/l LE. Digital hair present b/l. No pedal edema b/l. Skin temperature gradient WNL b/l. Varicosities present b/l. Evidence of chronic venous insufficiency b/l LE. No cyanosis or clubbing noted b/l LE.Marland Kitchen  Dermatological Examination: Pedal skin with normal turgor, texture and tone b/l. No open wounds. No interdigital macerations b/l. Toenails 1-5 b/l thickened, discolored, dystrophic with subungual debris.  There is pain on palpation to dorsal aspect of nailplates.   Preulcerative lesion noted distal tip of left 3rd toe. There is visible subdermal hemorrhage. There is no surrounding erythema, no edema, no drainage, no odor, no fluctuance..  Neurological Examination: Protective sensation intact with 10 gram monofilament b/l LE. Vibratory sensation intact b/l LE.   Musculoskeletal Examination: Muscle strength 5/5 to all LE muscle groups b/l. HAV with bunion bilaterally and hammertoes 2-5 b/l.  Assessment/Plan: 1. Pain due to onychomycosis of toenails of both feet   2. Pre-ulcerative corn or callous   3. Controlled type 2 diabetes mellitus without complication, without long-term current use of insulin ([place])     No orders of the defined types were placed in this encounter.  -Patient was evaluated and treated. All patient's and/or POA's questions/concerns answered on today's visit. -Continue foot and shoe inspections daily. Monitor blood glucose per PCP/Endocrinologist's recommendations. -Toenails 1-5 b/l were debrided in length and girth with sterile nail nippers and dremel without iatrogenic bleeding.  -Preulcerative lesion pared L 3rd toe utilizing sterile scalpel blade. Total number pared=1. -Patient/POA to call should there be question/concern in the interim.   Return in about 3 months (around 12/30/2022).  Marzetta Board, DPM

## 2022-10-05 ENCOUNTER — Encounter: Payer: Self-pay | Admitting: Podiatry

## 2022-10-08 DIAGNOSIS — H52203 Unspecified astigmatism, bilateral: Secondary | ICD-10-CM | POA: Diagnosis not present

## 2022-10-08 DIAGNOSIS — E119 Type 2 diabetes mellitus without complications: Secondary | ICD-10-CM | POA: Diagnosis not present

## 2022-10-08 DIAGNOSIS — H353132 Nonexudative age-related macular degeneration, bilateral, intermediate dry stage: Secondary | ICD-10-CM | POA: Diagnosis not present

## 2022-10-08 DIAGNOSIS — Z961 Presence of intraocular lens: Secondary | ICD-10-CM | POA: Diagnosis not present

## 2022-11-24 DIAGNOSIS — I350 Nonrheumatic aortic (valve) stenosis: Secondary | ICD-10-CM | POA: Diagnosis not present

## 2022-11-24 DIAGNOSIS — Z0000 Encounter for general adult medical examination without abnormal findings: Secondary | ICD-10-CM | POA: Diagnosis not present

## 2022-11-24 DIAGNOSIS — I872 Venous insufficiency (chronic) (peripheral): Secondary | ICD-10-CM | POA: Diagnosis not present

## 2022-11-24 DIAGNOSIS — Z79899 Other long term (current) drug therapy: Secondary | ICD-10-CM | POA: Diagnosis not present

## 2022-11-24 DIAGNOSIS — I10 Essential (primary) hypertension: Secondary | ICD-10-CM | POA: Diagnosis not present

## 2022-11-24 DIAGNOSIS — E7800 Pure hypercholesterolemia, unspecified: Secondary | ICD-10-CM | POA: Diagnosis not present

## 2022-11-24 DIAGNOSIS — M8589 Other specified disorders of bone density and structure, multiple sites: Secondary | ICD-10-CM | POA: Diagnosis not present

## 2022-11-24 DIAGNOSIS — E1142 Type 2 diabetes mellitus with diabetic polyneuropathy: Secondary | ICD-10-CM | POA: Diagnosis not present

## 2022-11-24 DIAGNOSIS — Z23 Encounter for immunization: Secondary | ICD-10-CM | POA: Diagnosis not present

## 2022-12-05 DIAGNOSIS — I350 Nonrheumatic aortic (valve) stenosis: Secondary | ICD-10-CM | POA: Diagnosis not present

## 2023-01-07 ENCOUNTER — Encounter: Payer: Self-pay | Admitting: Podiatry

## 2023-01-07 ENCOUNTER — Ambulatory Visit (INDEPENDENT_AMBULATORY_CARE_PROVIDER_SITE_OTHER): Payer: Medicare Other | Admitting: Podiatry

## 2023-01-07 DIAGNOSIS — M2012 Hallux valgus (acquired), left foot: Secondary | ICD-10-CM

## 2023-01-07 DIAGNOSIS — E119 Type 2 diabetes mellitus without complications: Secondary | ICD-10-CM

## 2023-01-07 DIAGNOSIS — M79675 Pain in left toe(s): Secondary | ICD-10-CM

## 2023-01-07 DIAGNOSIS — L84 Corns and callosities: Secondary | ICD-10-CM | POA: Diagnosis not present

## 2023-01-07 DIAGNOSIS — B351 Tinea unguium: Secondary | ICD-10-CM | POA: Diagnosis not present

## 2023-01-07 DIAGNOSIS — M2041 Other hammer toe(s) (acquired), right foot: Secondary | ICD-10-CM

## 2023-01-07 DIAGNOSIS — M79674 Pain in right toe(s): Secondary | ICD-10-CM | POA: Diagnosis not present

## 2023-01-10 NOTE — Progress Notes (Signed)
  Subjective:  Patient ID: Patricia Petersen, female    DOB: [birth year],  MRN: [id number]  Patricia Petersen presents to clinic today for preventative diabetic foot care and preulcerative lesion(s) left lower extremity and painful mycotic toenails that limit ambulation. Painful toenails interfere with ambulation. Aggravating factors include wearing enclosed shoe gear. Pain is relieved with periodic professional debridement. Painful porokeratotic lesions are aggravated when weightbearing with and without shoegear. Pain is relieved with periodic professional debridement.  Chief Complaint  Patient presents with   Nail Problem    [facility] BS-did not check today A1C-do not know PCP-Raju PCP VST-5 or 6 weeks ago   New problem(s): None.   PCP is Charlane Ferretti, MD.  Allergies  Allergen Reactions   Arthro-Complex [Nutritional Supplements]    Empagliflozin Other (See Comments)    Other reaction(s): yeast infection   Levaquin [Levofloxacin In D5w]    Levofloxacin Other (See Comments)   Lipitor [Atorvastatin]    Metformin Hcl Other (See Comments)    Other reaction(s): stomach cramps   Penicillin G     Other reaction(s): throat closing up   Penicillins    Terbinafine Other (See Comments)    Other reaction(s): Unknown   Review of Systems: Negative except as noted in the HPI.  Objective: No changes noted in today's physical examination. There were no vitals filed for this visit. Patricia Petersen is a pleasant 89 y.o. female morbidly obese in NAD. AAO x 3.  Vascular Examination: Palpable pedal pulses b/l LE. Digital hair present b/l. No pedal edema b/l. Skin temperature gradient WNL b/l. Varicosities present b/l. Evidence of chronic venous insufficiency b/l LE. No cyanosis or clubbing noted b/l LE.Marland Kitchen  Dermatological Examination: Pedal skin with normal turgor, texture and tone b/l. No open wounds. No interdigital macerations b/l. Toenails 1-5 b/l thickened, discolored, dystrophic with subungual debris.  There is pain on palpation to dorsal aspect of nailplates.   Preulcerative lesion noted distal tip of left 3rd toe. There is visible subdermal hemorrhage. There is no surrounding erythema, no edema, no drainage, no odor, no fluctuance..  Neurological Examination: Protective sensation intact with 10 gram monofilament b/l LE. Vibratory sensation intact b/l LE.   Musculoskeletal Examination: Muscle strength 5/5 to all LE muscle groups b/l. HAV with bunion bilaterally and hammertoes 2-5 b/l.  Assessment/Plan: 1. Pain due to onychomycosis of toenails of both feet   2. Pre-ulcerative corn or callous   3. Controlled type 2 diabetes mellitus without complication, without long-term current use of insulin ([place])    -Consent given for treatment as described below: -Examined patient. -Patient to continue soft, supportive shoe gear daily. -Mycotic toenails 1-5 bilaterally were debrided in length and girth with sterile nail nippers and dremel without incident. -Preulcerative lesion pared left third digit utilizing sterile scalpel blade. Total number pared=1. -Patient/POA to call should there be question/concern in the interim.   Return in about 9 weeks (around 03/11/2023).  Marzetta Board, DPM

## 2023-02-04 DIAGNOSIS — M1711 Unilateral primary osteoarthritis, right knee: Secondary | ICD-10-CM | POA: Diagnosis not present

## 2023-02-04 DIAGNOSIS — G5601 Carpal tunnel syndrome, right upper limb: Secondary | ICD-10-CM | POA: Diagnosis not present

## 2023-02-19 DIAGNOSIS — M5010 Cervical disc disorder with radiculopathy, unspecified cervical region: Secondary | ICD-10-CM | POA: Diagnosis not present

## 2023-02-19 DIAGNOSIS — M19012 Primary osteoarthritis, left shoulder: Secondary | ICD-10-CM | POA: Diagnosis not present

## 2023-03-03 DIAGNOSIS — R2689 Other abnormalities of gait and mobility: Secondary | ICD-10-CM | POA: Diagnosis not present

## 2023-03-03 DIAGNOSIS — R293 Abnormal posture: Secondary | ICD-10-CM | POA: Diagnosis not present

## 2023-03-03 DIAGNOSIS — M6289 Other specified disorders of muscle: Secondary | ICD-10-CM | POA: Diagnosis not present

## 2023-03-03 DIAGNOSIS — M542 Cervicalgia: Secondary | ICD-10-CM | POA: Diagnosis not present

## 2023-03-05 DIAGNOSIS — R293 Abnormal posture: Secondary | ICD-10-CM | POA: Diagnosis not present

## 2023-03-05 DIAGNOSIS — M542 Cervicalgia: Secondary | ICD-10-CM | POA: Diagnosis not present

## 2023-03-05 DIAGNOSIS — R2689 Other abnormalities of gait and mobility: Secondary | ICD-10-CM | POA: Diagnosis not present

## 2023-03-05 DIAGNOSIS — M6289 Other specified disorders of muscle: Secondary | ICD-10-CM | POA: Diagnosis not present

## 2023-03-09 DIAGNOSIS — M542 Cervicalgia: Secondary | ICD-10-CM | POA: Diagnosis not present

## 2023-03-09 DIAGNOSIS — R293 Abnormal posture: Secondary | ICD-10-CM | POA: Diagnosis not present

## 2023-03-09 DIAGNOSIS — M6289 Other specified disorders of muscle: Secondary | ICD-10-CM | POA: Diagnosis not present

## 2023-03-09 DIAGNOSIS — R2689 Other abnormalities of gait and mobility: Secondary | ICD-10-CM | POA: Diagnosis not present

## 2023-03-10 DIAGNOSIS — R2689 Other abnormalities of gait and mobility: Secondary | ICD-10-CM | POA: Diagnosis not present

## 2023-03-10 DIAGNOSIS — M542 Cervicalgia: Secondary | ICD-10-CM | POA: Diagnosis not present

## 2023-03-10 DIAGNOSIS — M6289 Other specified disorders of muscle: Secondary | ICD-10-CM | POA: Diagnosis not present

## 2023-03-10 DIAGNOSIS — R293 Abnormal posture: Secondary | ICD-10-CM | POA: Diagnosis not present

## 2023-03-12 DIAGNOSIS — M6289 Other specified disorders of muscle: Secondary | ICD-10-CM | POA: Diagnosis not present

## 2023-03-12 DIAGNOSIS — R293 Abnormal posture: Secondary | ICD-10-CM | POA: Diagnosis not present

## 2023-03-12 DIAGNOSIS — M542 Cervicalgia: Secondary | ICD-10-CM | POA: Diagnosis not present

## 2023-03-12 DIAGNOSIS — R2689 Other abnormalities of gait and mobility: Secondary | ICD-10-CM | POA: Diagnosis not present

## 2023-03-16 DIAGNOSIS — M542 Cervicalgia: Secondary | ICD-10-CM | POA: Diagnosis not present

## 2023-03-16 DIAGNOSIS — R293 Abnormal posture: Secondary | ICD-10-CM | POA: Diagnosis not present

## 2023-03-16 DIAGNOSIS — M6289 Other specified disorders of muscle: Secondary | ICD-10-CM | POA: Diagnosis not present

## 2023-03-16 DIAGNOSIS — R2689 Other abnormalities of gait and mobility: Secondary | ICD-10-CM | POA: Diagnosis not present

## 2023-03-17 DIAGNOSIS — M6289 Other specified disorders of muscle: Secondary | ICD-10-CM | POA: Diagnosis not present

## 2023-03-17 DIAGNOSIS — R2689 Other abnormalities of gait and mobility: Secondary | ICD-10-CM | POA: Diagnosis not present

## 2023-03-17 DIAGNOSIS — M542 Cervicalgia: Secondary | ICD-10-CM | POA: Diagnosis not present

## 2023-03-17 DIAGNOSIS — R293 Abnormal posture: Secondary | ICD-10-CM | POA: Diagnosis not present

## 2023-03-19 DIAGNOSIS — R293 Abnormal posture: Secondary | ICD-10-CM | POA: Diagnosis not present

## 2023-03-19 DIAGNOSIS — M6289 Other specified disorders of muscle: Secondary | ICD-10-CM | POA: Diagnosis not present

## 2023-03-19 DIAGNOSIS — M542 Cervicalgia: Secondary | ICD-10-CM | POA: Diagnosis not present

## 2023-03-19 DIAGNOSIS — R2689 Other abnormalities of gait and mobility: Secondary | ICD-10-CM | POA: Diagnosis not present

## 2023-03-30 DIAGNOSIS — R2689 Other abnormalities of gait and mobility: Secondary | ICD-10-CM | POA: Diagnosis not present

## 2023-03-30 DIAGNOSIS — R293 Abnormal posture: Secondary | ICD-10-CM | POA: Diagnosis not present

## 2023-03-30 DIAGNOSIS — M6289 Other specified disorders of muscle: Secondary | ICD-10-CM | POA: Diagnosis not present

## 2023-03-30 DIAGNOSIS — M542 Cervicalgia: Secondary | ICD-10-CM | POA: Diagnosis not present

## 2023-03-31 DIAGNOSIS — M6289 Other specified disorders of muscle: Secondary | ICD-10-CM | POA: Diagnosis not present

## 2023-03-31 DIAGNOSIS — M542 Cervicalgia: Secondary | ICD-10-CM | POA: Diagnosis not present

## 2023-03-31 DIAGNOSIS — R293 Abnormal posture: Secondary | ICD-10-CM | POA: Diagnosis not present

## 2023-03-31 DIAGNOSIS — R2689 Other abnormalities of gait and mobility: Secondary | ICD-10-CM | POA: Diagnosis not present

## 2023-04-02 DIAGNOSIS — M6289 Other specified disorders of muscle: Secondary | ICD-10-CM | POA: Diagnosis not present

## 2023-04-02 DIAGNOSIS — M542 Cervicalgia: Secondary | ICD-10-CM | POA: Diagnosis not present

## 2023-04-02 DIAGNOSIS — R2689 Other abnormalities of gait and mobility: Secondary | ICD-10-CM | POA: Diagnosis not present

## 2023-04-02 DIAGNOSIS — R293 Abnormal posture: Secondary | ICD-10-CM | POA: Diagnosis not present

## 2023-04-06 DIAGNOSIS — R2689 Other abnormalities of gait and mobility: Secondary | ICD-10-CM | POA: Diagnosis not present

## 2023-04-06 DIAGNOSIS — M6289 Other specified disorders of muscle: Secondary | ICD-10-CM | POA: Diagnosis not present

## 2023-04-06 DIAGNOSIS — M542 Cervicalgia: Secondary | ICD-10-CM | POA: Diagnosis not present

## 2023-04-06 DIAGNOSIS — R293 Abnormal posture: Secondary | ICD-10-CM | POA: Diagnosis not present

## 2023-04-07 DIAGNOSIS — R293 Abnormal posture: Secondary | ICD-10-CM | POA: Diagnosis not present

## 2023-04-07 DIAGNOSIS — M542 Cervicalgia: Secondary | ICD-10-CM | POA: Diagnosis not present

## 2023-04-07 DIAGNOSIS — R2689 Other abnormalities of gait and mobility: Secondary | ICD-10-CM | POA: Diagnosis not present

## 2023-04-07 DIAGNOSIS — M6289 Other specified disorders of muscle: Secondary | ICD-10-CM | POA: Diagnosis not present

## 2023-04-14 DIAGNOSIS — R2689 Other abnormalities of gait and mobility: Secondary | ICD-10-CM | POA: Diagnosis not present

## 2023-04-14 DIAGNOSIS — M542 Cervicalgia: Secondary | ICD-10-CM | POA: Diagnosis not present

## 2023-04-14 DIAGNOSIS — M6289 Other specified disorders of muscle: Secondary | ICD-10-CM | POA: Diagnosis not present

## 2023-04-14 DIAGNOSIS — R293 Abnormal posture: Secondary | ICD-10-CM | POA: Diagnosis not present

## 2023-04-16 DIAGNOSIS — M6289 Other specified disorders of muscle: Secondary | ICD-10-CM | POA: Diagnosis not present

## 2023-04-16 DIAGNOSIS — R293 Abnormal posture: Secondary | ICD-10-CM | POA: Diagnosis not present

## 2023-04-16 DIAGNOSIS — R2689 Other abnormalities of gait and mobility: Secondary | ICD-10-CM | POA: Diagnosis not present

## 2023-04-16 DIAGNOSIS — M542 Cervicalgia: Secondary | ICD-10-CM | POA: Diagnosis not present

## 2023-04-20 DIAGNOSIS — R2689 Other abnormalities of gait and mobility: Secondary | ICD-10-CM | POA: Diagnosis not present

## 2023-04-20 DIAGNOSIS — M542 Cervicalgia: Secondary | ICD-10-CM | POA: Diagnosis not present

## 2023-04-20 DIAGNOSIS — R293 Abnormal posture: Secondary | ICD-10-CM | POA: Diagnosis not present

## 2023-04-20 DIAGNOSIS — M6289 Other specified disorders of muscle: Secondary | ICD-10-CM | POA: Diagnosis not present

## 2023-04-21 DIAGNOSIS — M542 Cervicalgia: Secondary | ICD-10-CM | POA: Diagnosis not present

## 2023-04-21 DIAGNOSIS — M6289 Other specified disorders of muscle: Secondary | ICD-10-CM | POA: Diagnosis not present

## 2023-04-21 DIAGNOSIS — R293 Abnormal posture: Secondary | ICD-10-CM | POA: Diagnosis not present

## 2023-04-21 DIAGNOSIS — R2689 Other abnormalities of gait and mobility: Secondary | ICD-10-CM | POA: Diagnosis not present

## 2023-04-23 DIAGNOSIS — R293 Abnormal posture: Secondary | ICD-10-CM | POA: Diagnosis not present

## 2023-04-23 DIAGNOSIS — M542 Cervicalgia: Secondary | ICD-10-CM | POA: Diagnosis not present

## 2023-04-23 DIAGNOSIS — R2689 Other abnormalities of gait and mobility: Secondary | ICD-10-CM | POA: Diagnosis not present

## 2023-04-23 DIAGNOSIS — M6289 Other specified disorders of muscle: Secondary | ICD-10-CM | POA: Diagnosis not present

## 2023-04-27 DIAGNOSIS — M542 Cervicalgia: Secondary | ICD-10-CM | POA: Diagnosis not present

## 2023-04-27 DIAGNOSIS — R293 Abnormal posture: Secondary | ICD-10-CM | POA: Diagnosis not present

## 2023-04-27 DIAGNOSIS — R2689 Other abnormalities of gait and mobility: Secondary | ICD-10-CM | POA: Diagnosis not present

## 2023-04-27 DIAGNOSIS — M6289 Other specified disorders of muscle: Secondary | ICD-10-CM | POA: Diagnosis not present

## 2023-04-28 DIAGNOSIS — R2689 Other abnormalities of gait and mobility: Secondary | ICD-10-CM | POA: Diagnosis not present

## 2023-04-28 DIAGNOSIS — R293 Abnormal posture: Secondary | ICD-10-CM | POA: Diagnosis not present

## 2023-04-28 DIAGNOSIS — M6289 Other specified disorders of muscle: Secondary | ICD-10-CM | POA: Diagnosis not present

## 2023-04-28 DIAGNOSIS — M542 Cervicalgia: Secondary | ICD-10-CM | POA: Diagnosis not present

## 2023-05-04 DIAGNOSIS — M542 Cervicalgia: Secondary | ICD-10-CM | POA: Diagnosis not present

## 2023-05-04 DIAGNOSIS — R293 Abnormal posture: Secondary | ICD-10-CM | POA: Diagnosis not present

## 2023-05-04 DIAGNOSIS — M6289 Other specified disorders of muscle: Secondary | ICD-10-CM | POA: Diagnosis not present

## 2023-05-04 DIAGNOSIS — R2689 Other abnormalities of gait and mobility: Secondary | ICD-10-CM | POA: Diagnosis not present

## 2023-05-05 DIAGNOSIS — R2689 Other abnormalities of gait and mobility: Secondary | ICD-10-CM | POA: Diagnosis not present

## 2023-05-05 DIAGNOSIS — M6289 Other specified disorders of muscle: Secondary | ICD-10-CM | POA: Diagnosis not present

## 2023-05-05 DIAGNOSIS — M542 Cervicalgia: Secondary | ICD-10-CM | POA: Diagnosis not present

## 2023-05-05 DIAGNOSIS — R293 Abnormal posture: Secondary | ICD-10-CM | POA: Diagnosis not present

## 2023-05-19 ENCOUNTER — Encounter: Payer: Self-pay | Admitting: Podiatry

## 2023-05-19 ENCOUNTER — Ambulatory Visit: Payer: Medicare Other | Admitting: Podiatry

## 2023-05-19 DIAGNOSIS — M79674 Pain in right toe(s): Secondary | ICD-10-CM | POA: Diagnosis not present

## 2023-05-19 DIAGNOSIS — M79675 Pain in left toe(s): Secondary | ICD-10-CM | POA: Diagnosis not present

## 2023-05-19 DIAGNOSIS — B351 Tinea unguium: Secondary | ICD-10-CM | POA: Diagnosis not present

## 2023-05-19 DIAGNOSIS — L84 Corns and callosities: Secondary | ICD-10-CM

## 2023-05-19 DIAGNOSIS — E119 Type 2 diabetes mellitus without complications: Secondary | ICD-10-CM

## 2023-05-24 NOTE — Progress Notes (Signed)
  Subjective:  Patient ID: Patricia Petersen, female    DOB: October 10, 1934,  MRN: [id number]  Patricia Petersen presents to clinic today for preventative diabetic foot care and preulcerative lesion(s) left foot and painful mycotic toenails that limit ambulation. Painful toenails interfere with ambulation. Aggravating factors include wearing enclosed shoe gear. Pain is relieved with periodic professional debridement. Painful preulcerative lesion(s) is/are aggravated when weightbearing with and without shoegear. Pain is relieved with periodic professional debridement.  Chief Complaint  Patient presents with   Diabetes    [name] BS - 118 A1C - DK  LVPCP - 12/2022   New problem(s): None.   PCP is Thana Ates, MD.  Allergies  Allergen Reactions   Arthro-Complex [Nutritional Supplements]    Empagliflozin Other (See Comments)    Other reaction(s): yeast infection   Levaquin [Levofloxacin In D5w]    Levofloxacin Other (See Comments)   Lipitor [Atorvastatin]    Metformin Hcl Other (See Comments)    Other reaction(s): stomach cramps   Penicillin G     Other reaction(s): throat closing up   Penicillins    Terbinafine Other (See Comments)    Other reaction(s): Unknown    Review of Systems: Negative except as noted in the HPI.  Objective: No changes noted in today's physical examination. There were no vitals filed for this visit. Patricia Petersen is a pleasant 89 y.o. female in NAD. AAO x 3.  Vascular Examination: Palpable pedal pulses b/l LE. Digital hair present b/l. No pedal edema b/l. Skin temperature gradient WNL b/l. Varicosities present b/l. Evidence of chronic venous insufficiency b/l LE. No cyanosis or clubbing noted b/l LE.Marland Kitchen  Dermatological Examination: Pedal skin with normal turgor, texture and tone b/l. No open wounds. No interdigital macerations b/l. Toenails 1-5 b/l thickened, discolored, dystrophic with subungual debris. There is pain on palpation to dorsal aspect of nailplates.    Preulcerative lesion noted distal tip of left 3rd toe. There is visible subdermal hemorrhage. There is no surrounding erythema, no edema, no drainage, no odor, no fluctuance..  Neurological Examination: Protective sensation intact with 10 gram monofilament b/l LE. Vibratory sensation intact b/l LE.   Musculoskeletal Examination: Muscle strength 5/5 to all LE muscle groups b/l. HAV with bunion bilaterally and hammertoes 2-5 b/l.  Assessment/Plan: 1. Pain due to onychomycosis of toenails of both feet   2. Pre-ulcerative corn or callous   3. Controlled type 2 diabetes mellitus without complication, without long-term current use of insulin ([facility])    -Patient was evaluated and treated. All patient's and/or POA's questions/concerns answered on today's visit. -Continue foot and shoe inspections daily. Monitor blood glucose per PCP/Endocrinologist's recommendations. -Continue supportive shoe gear daily. -Toenails 1-5 b/l were debrided in length and girth with sterile nail nippers and dremel without iatrogenic bleeding.  -Preulcerative lesion pared L 3rd toe utilizing sterile scalpel blade. Total number pared=1. -Patient/POA to call should there be question/concern in the interim.   Return in about 3 months (around 08/19/2023).  Freddie Breech, DPM

## 2023-07-15 ENCOUNTER — Other Ambulatory Visit (HOSPITAL_COMMUNITY): Payer: Self-pay

## 2023-08-05 DIAGNOSIS — M170 Bilateral primary osteoarthritis of knee: Secondary | ICD-10-CM | POA: Diagnosis not present

## 2023-08-26 ENCOUNTER — Ambulatory Visit (INDEPENDENT_AMBULATORY_CARE_PROVIDER_SITE_OTHER): Payer: Medicare Other | Admitting: Podiatry

## 2023-08-26 ENCOUNTER — Encounter: Payer: Self-pay | Admitting: Podiatry

## 2023-08-26 DIAGNOSIS — M2041 Other hammer toe(s) (acquired), right foot: Secondary | ICD-10-CM | POA: Diagnosis not present

## 2023-08-26 DIAGNOSIS — M2011 Hallux valgus (acquired), right foot: Secondary | ICD-10-CM | POA: Diagnosis not present

## 2023-08-26 DIAGNOSIS — M2042 Other hammer toe(s) (acquired), left foot: Secondary | ICD-10-CM | POA: Diagnosis not present

## 2023-08-26 DIAGNOSIS — E119 Type 2 diabetes mellitus without complications: Secondary | ICD-10-CM

## 2023-08-26 DIAGNOSIS — M2012 Hallux valgus (acquired), left foot: Secondary | ICD-10-CM | POA: Diagnosis not present

## 2023-08-26 DIAGNOSIS — M79675 Pain in left toe(s): Secondary | ICD-10-CM | POA: Diagnosis not present

## 2023-08-26 DIAGNOSIS — M79674 Pain in right toe(s): Secondary | ICD-10-CM

## 2023-08-26 DIAGNOSIS — L84 Corns and callosities: Secondary | ICD-10-CM | POA: Diagnosis not present

## 2023-08-26 DIAGNOSIS — B351 Tinea unguium: Secondary | ICD-10-CM

## 2023-08-30 NOTE — Progress Notes (Signed)
ANNUAL DIABETIC FOOT EXAM  Subjective: Patricia Petersen presents today for annual diabetic foot exam. Chief Complaint  Patient presents with   Diabetes    [facility] BS-112 PCP- A1C   Patient confirms h/o diabetes.  Patient denies any h/o foot wounds.  Patricia Ates, MD is patient's PCP.  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     acetaminophen (TYLENOL) 500 MG tablet 2 tabs as needed     aspirin 81 MG tablet Take 81 mg by mouth daily.     atorvastatin (LIPITOR) 20 MG tablet Take 20 mg by mouth daily.     Blood Glucose Monitoring Suppl (ACCU-CHEK GUIDE) w/Device KIT Check glucose levels ONCE a DAY     metFORMIN (GLUCOPHAGE) 1000 MG tablet Take by mouth.     OZEMPIC, 0.25 OR 0.5 MG/DOSE, 2 MG/3ML SOPN Inject into the skin.     RYBELSUS  14 MG TABS Take 1 tablet by mouth every morning.     simvastatin (ZOCOR) 20 MG tablet Take 20 mg by mouth every evening.     Aspirin-Calcium Carbonate 81-777 MG TABS Take by mouth.     atorvastatin (LIPITOR) 20 MG tablet      betamethasone dipropionate 0.05 % cream      cholecalciferol (VITAMIN D) 1000 UNITS tablet Take 1,000 Units by mouth daily.     clotrimazole (LOTRIMIN) 1 % cream 1 application     clotrimazole (LOTRIMIN) 1 % cream      clotrimazole-betamethasone (LOTRISONE) cream      COVID-19 mRNA vaccine 2023-2024 (COMIRNATY) syringe Inject into the muscle. 0.3 mL 0   diclofenac (VOLTAREN) 75 MG EC tablet TAKE 1 TABLET BY MOUTH TWICE DAILY.     diclofenac (VOLTAREN) 75 MG EC tablet      Difluprednate (DUREZOL) 0.05 % EMUL      diltiazem (CARDIZEM CD) 180 MG 24 hr capsule Take 1 capsule (180 mg total) by mouth daily. 30 capsule 5   diltiazem (TIAZAC) 180 MG 24 hr capsule Take by mouth.     estradiol (ESTRACE) 0.1 MG/GM vaginal cream      fluconazole (DIFLUCAN) 200 MG tablet      furosemide (LASIX) 40 MG tablet Take by mouth.     glipiZIDE (GLUCOTROL XL) 2.5 MG 24 hr tablet Take 2.5 mg by mouth every morning.  influenza vaccine adjuvanted (FLUAD QUADRIVALENT) 0.5 ML injection Inject into the muscle. 0.5 mL 0   JARDIANCE 10 MG TABS tablet Take 10 mg by mouth daily.     metFORMIN (GLUCOPHAGE-XR) 500 MG 24 hr tablet Take 500 mg by mouth 2 (two) times daily.     olmesartan-hydrochlorothiazide (BENICAR HCT) 40-25 MG per tablet Take 1 tablet by mouth daily. 30 tablet 6   potassium chloride SA (KLOR-CON) 20 MEQ tablet Take by mouth.     No current facility-administered medications on file prior to visit.    Allergies  Allergen Reactions   Arthro-Complex [Nutritional Supplements]    Empagliflozin Other (See Comments)    Other reaction(s): yeast infection   Levaquin [Levofloxacin In D5w]    Levofloxacin Other (See Comments)   Lipitor [Atorvastatin]    Metformin Hcl Other (See  Comments)    Other reaction(s): stomach cramps   Penicillin G     Other reaction(s): throat closing up   Penicillins    Terbinafine Other (See Comments)    Other reaction(s): Unknown   Social History   Occupational History   Not on file  Tobacco Use   Smoking status: Never   Smokeless tobacco: Never  Substance and Sexual Activity   Alcohol use: Not on file   Drug use: Not on file   Sexual activity: Not on file   Family History  Problem Relation Age of Onset   Heart disease Other    Immunization History  Administered Date(s) Administered   Fluad Quad(high Dose 65+) 07/28/2022   Influenza Split 07/26/2009, 07/31/2010, 08/13/2011, 09/02/2012, 09/20/2013, 08/04/2019, 08/24/2020   Influenza,inj,Quad PF,6+ Mos 07/29/2017   Moderna Sars-Covid-2 Vaccination 11/22/2019, 12/06/2019, 08/31/2020   Pfizer(Comirnaty)Fall Seasonal Vaccine 12 years and older 08/12/2022   Pneumococcal Conjugate-13 10/02/2014   Pneumococcal Polysaccharide-23 03/21/2009, 04/22/2017   Td 03/21/2009   Tdap 08/18/2019   Zoster, Live 03/15/2013, 09/13/2019, 03/06/2020     Review of Systems: Negative except as noted in the HPI.   Objective: There were no vitals filed for this visit.  Patricia Petersen is a pleasant 89 y.o. female in NAD. AAO X 3.  Title   Diabetic Foot Exam - detailed Date & Time: 08/26/2023  1:45 PM Diabetic Foot exam was performed with the following findings: Yes  Visual Foot Exam completed.: Yes  Is there a history of foot ulcer?: No Is there a foot ulcer now?: No Is there swelling?: Yes Is there elevated skin temperature?: No Is there abnormal foot shape?: Yes (Comment: HAV with bunion b/l; hammertoes b/l.) Is there a claw toe deformity?: No Are the toenails long?: Yes Are the toenails thick?: Yes Are the toenails ingrown?: No Is the skin thin, fragile, shiny and hairless?": No Normal Range of Motion?: No Is there foot or ankle muscle weakness?: No Do you have pain in calf  while walking?: No Are the shoes appropriate in style and fit?: Yes Can the patient see the bottom of their feet?: No Pulse Foot Exam completed.: Yes   Right Posterior Tibialis: Present Left posterior Tibialis: Present   Right Dorsalis Pedis: Present Left Dorsalis Pedis: Present     Sensory Foot Exam Completed.: Yes Semmes-Weinstein Monofilament Test "+" means "has sensation" and "-" means "no sensation"  R Foot Test Control: Pos L Foot Test Control: Pos   R Site 1-Great Toe: Pos L Site 1-Great Toe: Pos   R Site 4: Pos L Site 4: Pos   R site 5: Pos L Site 5: Pos  R  Site 6: Pos L Site 6: Pos     Image components are not supported.   Image components are not supported. Image components are not supported.  [place] Right vibratory: present Left vibratory: present  Comments Preulcerative lesion noted distal tip of left 3rd toe. There is visible subdermal hemorrhage. There is no surrounding erythema, no edema, no drainage, no odor, no fluctuance.     ADA Risk Categorization: Low Risk :  Patient has all of the following: Intact protective sensation No prior foot ulcer  No severe deformity Pedal pulses present  Assessment: 1. Pain due to onychomycosis of toenails of both feet   2. Pre-ulcerative corn or callous   3. Hallux valgus, acquired, bilateral   4. Acquired hammertoes of both feet   5. Controlled type 2 diabetes mellitus without complication, without long-term current use of insulin (HCC)   6. Encounter for diabetic foot exam ([facility])     Plan: -Consent given for treatment as described below: -Examined patient. -Diabetic foot examination performed today. -Continue diabetic foot care principles: inspect feet daily, monitor glucose as recommended by PCP and/or Endocrinologist, and follow prescribed diet per PCP, Endocrinologist and/or dietician. -Mycotic toenails 1-5 bilaterally were debrided in length and girth with sterile nail nippers and dremel without  incident. -Preulcerative lesion pared left third digit utilizing sterile scalpel blade. Total number pared=1. -Patient/POA to call should there be question/concern in the interim. Return in about 3 months (around 11/26/2023).  Freddie Breech, DPM

## 2023-09-18 DIAGNOSIS — H353132 Nonexudative age-related macular degeneration, bilateral, intermediate dry stage: Secondary | ICD-10-CM | POA: Diagnosis not present

## 2023-09-18 DIAGNOSIS — Z961 Presence of intraocular lens: Secondary | ICD-10-CM | POA: Diagnosis not present

## 2023-09-18 DIAGNOSIS — E119 Type 2 diabetes mellitus without complications: Secondary | ICD-10-CM | POA: Diagnosis not present

## 2023-09-18 DIAGNOSIS — H52203 Unspecified astigmatism, bilateral: Secondary | ICD-10-CM | POA: Diagnosis not present

## 2023-10-13 ENCOUNTER — Encounter (HOSPITAL_BASED_OUTPATIENT_CLINIC_OR_DEPARTMENT_OTHER): Payer: Self-pay | Admitting: Emergency Medicine

## 2023-10-13 ENCOUNTER — Other Ambulatory Visit: Payer: Self-pay

## 2023-10-13 ENCOUNTER — Emergency Department (HOSPITAL_BASED_OUTPATIENT_CLINIC_OR_DEPARTMENT_OTHER)
Admission: EM | Admit: 2023-10-13 | Discharge: 2023-10-13 | Disposition: A | Discharge status: Home / Self Care | Payer: Medicare Other | Attending: Emergency Medicine | Admitting: Emergency Medicine

## 2023-10-13 ENCOUNTER — Emergency Department (HOSPITAL_BASED_OUTPATIENT_CLINIC_OR_DEPARTMENT_OTHER): Payer: Medicare Other | Admitting: Radiology

## 2023-10-13 DIAGNOSIS — M4316 Spondylolisthesis, lumbar region: Secondary | ICD-10-CM | POA: Diagnosis not present

## 2023-10-13 DIAGNOSIS — W19XXXA Unspecified fall, initial encounter: Secondary | ICD-10-CM | POA: Diagnosis not present

## 2023-10-13 DIAGNOSIS — M25561 Pain in right knee: Secondary | ICD-10-CM | POA: Insufficient documentation

## 2023-10-13 DIAGNOSIS — M47816 Spondylosis without myelopathy or radiculopathy, lumbar region: Secondary | ICD-10-CM | POA: Diagnosis not present

## 2023-10-13 DIAGNOSIS — I10 Essential (primary) hypertension: Secondary | ICD-10-CM | POA: Insufficient documentation

## 2023-10-13 DIAGNOSIS — S80919A Unspecified superficial injury of unspecified knee, initial encounter: Secondary | ICD-10-CM | POA: Diagnosis not present

## 2023-10-13 DIAGNOSIS — M1710 Unilateral primary osteoarthritis, unspecified knee: Secondary | ICD-10-CM

## 2023-10-13 DIAGNOSIS — M1712 Unilateral primary osteoarthritis, left knee: Secondary | ICD-10-CM | POA: Diagnosis not present

## 2023-10-13 DIAGNOSIS — Z79899 Other long term (current) drug therapy: Secondary | ICD-10-CM | POA: Insufficient documentation

## 2023-10-13 DIAGNOSIS — Z743 Need for continuous supervision: Secondary | ICD-10-CM | POA: Diagnosis not present

## 2023-10-13 DIAGNOSIS — M25461 Effusion, right knee: Secondary | ICD-10-CM | POA: Diagnosis not present

## 2023-10-13 DIAGNOSIS — M25562 Pain in left knee: Secondary | ICD-10-CM | POA: Diagnosis not present

## 2023-10-13 DIAGNOSIS — M47817 Spondylosis without myelopathy or radiculopathy, lumbosacral region: Secondary | ICD-10-CM | POA: Diagnosis not present

## 2023-10-13 DIAGNOSIS — M1711 Unilateral primary osteoarthritis, right knee: Secondary | ICD-10-CM | POA: Diagnosis not present

## 2023-10-13 DIAGNOSIS — Z043 Encounter for examination and observation following other accident: Secondary | ICD-10-CM | POA: Diagnosis not present

## 2023-10-13 DIAGNOSIS — M47819 Spondylosis without myelopathy or radiculopathy, site unspecified: Secondary | ICD-10-CM

## 2023-10-13 DIAGNOSIS — R6889 Other general symptoms and signs: Secondary | ICD-10-CM | POA: Diagnosis not present

## 2023-10-13 DIAGNOSIS — Z7982 Long term (current) use of aspirin: Secondary | ICD-10-CM | POA: Insufficient documentation

## 2023-10-13 DIAGNOSIS — Z7984 Long term (current) use of oral hypoglycemic drugs: Secondary | ICD-10-CM | POA: Diagnosis not present

## 2023-10-13 NOTE — ED Triage Notes (Signed)
BIB EMS. Reports falling on Sunday at church. Denies hitting head nor LOC. No blood thinners. C/o bilateral knee pain.

## 2023-10-13 NOTE — ED Notes (Addendum)
Pt preferred ace wrap bandage instead of knee brace. Applied to both knees. Pt tolerated well.

## 2023-10-13 NOTE — Discharge Instructions (Addendum)
Please follow-up with the orthopedic specialist have attached your for you today in regards recent ER visit.  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  Your spine x-ray also shows severe arthritis as well along with minor movement of your L3 and L4 in which she will need to follow-up with your neurosurgeon for.  In the meantime you may keep the knee sleeves on and use Tylenol every 6 hours along with ice and elevate your legs.  If symptoms change or worsen please return to the ER.

## 2023-10-13 NOTE — ED Notes (Signed)
Dreama at [facility] called for transport 17:58

## 2023-10-13 NOTE — ED Provider Notes (Signed)
[place] [facility]GENC[facility] DEPARTMENT AT [facility] Provider Note   CSN: [id number] Arrival date & time: 10/13/23  1111     History  Chief Complaint  Patient presents with   Patricia Petersen is a 89 y.o. female history of bilateral knee arthritis, hypertension, hyperlipidemia presenting after a fall that occurred 2 days ago.  Patient was at church when she felt her knees give out underneath her.  Patient states that she has been having progressive pain in her knees most likely from the arthritis.  Patient has received steroid injections in the past.  Patient does not see orthopedics at this time.  Patient not taking any medications or knee sleeves at this time.  Patient denies hitting her head or having any head or neck pain, LOC.  Patient has blood thinners or bleeding disorders.  Patient states when she fell she landed on her butt does not have any back pain or pelvic pain.  Patient failed to ambulate since then.  Patient denies paresthesias or obvious deformities.  Patient denies abdominal pain, nauseous vomiting, LOC.  Fall was witnessed.    Home Medications Prior to Admission medications   Medication Sig Start Date End Date Taking? Authorizing Provider  ACCU-CHEK GUIDE test strip chheck blood sugars ONCE daily, alternating morning AND evening 08/05/21   [provider]  Accu-Chek Softclix Lancets lancets daily. 06/20/21   [provider]  acetaminophen (TYLENOL) 500 MG tablet 2 tabs as needed    [provider]  aspirin 81 MG tablet Take 81 mg by mouth daily.    [provider]  Aspirin-Calcium Carbonate 81-777 MG TABS Take by mouth.    [provider]  atorvastatin (LIPITOR) 20 MG tablet Take 20 mg by mouth daily. 10/20/19   [provider]  atorvastatin (LIPITOR) 20 MG tablet     [provider]  betamethasone dipropionate 0.05 % cream     [provider]  Blood Glucose Monitoring Suppl (ACCU-CHEK  GUIDE) w/Device KIT Check glucose levels ONCE a DAY 06/11/21   [provider]  cholecalciferol (VITAMIN D) 1000 UNITS tablet Take 1,000 Units by mouth daily.    [provider]  clotrimazole (LOTRIMIN) 1 % cream 1 application    [provider]  clotrimazole (LOTRIMIN) 1 % cream     [provider]  clotrimazole-betamethasone (LOTRISONE) cream  09/14/19   [provider]  COVID-19 mRNA vaccine 2130-8657 (COMIRNATY) syringe Inject into the muscle. 08/12/22   Judyann Munson, MD  diclofenac (VOLTAREN) 75 MG EC tablet TAKE 1 TABLET BY MOUTH TWICE DAILY. 07/13/18   [provider]  diclofenac (VOLTAREN) 75 MG EC tablet     [provider]  Difluprednate (DUREZOL) 0.05 % EMUL  05/29/17   [provider]  diltiazem (CARDIZEM CD) 180 MG 24 hr capsule Take 1 capsule (180 mg total) by mouth daily. 03/20/11 03/19/12  Roger Shelter, MD  diltiazem [facility]) 180 MG 24 hr capsule Take by mouth. 03/20/11   [provider]  estradiol (ESTRACE) 0.1 MG/GM vaginal cream  08/11/19   [provider]  fluconazole (DIFLUCAN) 200 MG tablet  09/14/19   [provider]  furosemide (LASIX) 40 MG tablet Take by mouth.    [provider]  glipiZIDE (GLUCOTROL XL) 2.5 MG 24 hr tablet Take 2.5 mg by mouth every morning. 07/09/21   [provider]  influenza vaccine adjuvanted (FLUAD QUADRIVALENT) 0.5 ML injection Inject into the muscle. 07/28/22  Judyann Munson, MD  JARDIANCE 10 MG TABS tablet Take 10 mg by mouth daily. 10/20/19   [provider]  metFORMIN (GLUCOPHAGE) 1000 MG tablet Take by mouth.    [provider]  metFORMIN (GLUCOPHAGE-XR) 500 MG 24 hr tablet Take 500 mg by mouth 2 (two) times daily. 06/20/21   [provider]  olmesartan-hydrochlorothiazide (BENICAR HCT) 40-25 MG per tablet Take 1 tablet by mouth daily. 04/15/11 04/14/12  Rosalio Macadamia, NP  OZEMPIC, 0.25 OR 0.5  MG/DOSE, 2 MG/3ML SOPN Inject into the skin. 03/05/22   [provider]  potassium chloride SA (KLOR-CON) 20 MEQ tablet Take by mouth. 04/18/20   [provider]  RYBELSUS 14 MG TABS Take 1 tablet by mouth every morning. 03/06/22   [provider]  simvastatin (ZOCOR) 20 MG tablet Take 20 mg by mouth every evening.    [provider]      Allergies    Arthro-complex [nutritional supplements], Empagliflozin, Levaquin [levofloxacin in d5w], Levofloxacin, Lipitor [atorvastatin], Metformin hcl, Penicillin g, Penicillins, and Terbinafine    Review of Systems   Review of Systems  Physical Exam Updated Vital Signs BP (!) 151/72 (BP Location: Left Arm)   Pulse 84   Temp 98.7 F (37.1 C) (Oral)   Resp 18   Ht 5\' 5"  (1.651 m)   Wt 90.7 kg   SpO2 98%   BMI 33.28 kg/m  Physical Exam Vitals reviewed.  Constitutional:      General: She is not in acute distress. HENT:     Head: Normocephalic and atraumatic.  Eyes:     Extraocular Movements: Extraocular movements intact.     Conjunctiva/sclera: Conjunctivae normal.     Pupils: Pupils are equal, round, and reactive to light.  Cardiovascular:     Rate and Rhythm: Normal rate and regular rhythm.     Pulses: Normal pulses.     Heart sounds: Normal heart sounds.     Comments: 2+ bilateral radial/dorsalis pedis pulses with regular rate Pulmonary:     Effort: Pulmonary effort is normal. No respiratory distress.     Breath sounds: Normal breath sounds.  Abdominal:     Palpations: Abdomen is soft.     Tenderness: There is no abdominal tenderness. There is no guarding or rebound.  Musculoskeletal:        General: Normal range of motion.     Cervical back: Normal range of motion and neck supple.     Comments: 5 out of 5 bilateral grip/leg extension strength No midline tenderness or abnormalities Bilateral knees: Negative varus/valgus stress test, anterior/posterior drawer drawer test, 5 out of 5 knee  extension/flexion, no bony tenderness abnormalities or crepitus noted Bilateral ankles: 5 out of 5 plantarflexion/dorsiflexion No obvious deformities Soft compartments Pain not out of proportion Bilateral knees do appear edematous without warmth or skin color changes or fluctuance Pelvis is stable nontender  Skin:    General: Skin is warm and dry.     Capillary Refill: Capillary refill takes less than 2 seconds.  Neurological:     General: No focal deficit present.     Mental Status: She is alert and oriented to person, place, and time.     Sensory: Sensation is intact.     Motor: Motor function is intact.     Coordination: Coordination is intact.     Gait: Gait is intact.     Comments: Sensation intact in all 4 limbs Vision grossly intact Cranial nerves III through XII intact  Psychiatric:        Mood and Affect: Mood normal.     ED Results / Procedures / Treatments   Labs (all labs ordered are listed, but only abnormal results are displayed) Labs Reviewed - No data to display  EKG None  Radiology No results found.  Procedures Procedures    Medications Ordered in ED Medications - No data to display  ED Course/ Medical Decision Making/ A&P                                 Medical Decision Making Amount and/or Complexity of Data Reviewed Radiology: ordered.   Tommi Rumps 89 y.o. presented today for fall, bilateral knee pain. Working DDx that I considered at this time includes, but not limited to, arthritis, vasovagal episode, mechanical fall, ICH, epidural/subdural hematoma, basilar skull fracture, anemia, electrolyte abnormalities, drug-induced, arrhythmia, UTI, fracture, contusion, soft tissue injury.  R/o DDx: vasovagal episode, mechanical fall, ICH, epidural/subdural hematoma, basilar skull fracture, anemia, electrolyte abnormalities, drug-induced, arrhythmia, UTI, fracture, contusion, soft tissue injury: These are considered less likely due to history of  present illness, physical exam, lab/imaging findings  Review of prior external notes: 08/26/2023 office visit  Unique Tests and My Interpretation:  Lumbar spine x-ray: Severe arthritis noted with minimal anterior listhesis on L3 on L4 Right knee x-ray: Severe osteoarthritis with large effusion Left knee x-ray: Severe osteoarthritis with large effusion  Social Determinants of Health: none  Discussion with Independent Historian:  Daughter  Discussion of Management of Tests: None  Risk: Medium: prescription drug management  Risk Stratification Score: None  Plan: On exam patient was in no acute distress stable vitals.  Patient physical exam does show edematous bilateral knees however no concerning features.  Patient was neuro vastly intact and ultimately had reassuring physical exam.  Patient was only concerned about her knees at the time that I evaluated her and so we will get knee x-rays as patient states that she has arthritis in her knees which I suspect is become exacerbated over the past few days leading to the fall.  Patient denies any head or neck symptoms or any trauma to the area and does not want any imaging up there.  Patient's neuroexam was also reassuring.  Patient not have any back pain or bony tenderness abnormalities and had stable pelvis that was nontender however radiology notified me saying that they want a lumbar x-ray so we will add this on as well in the light of the low suspicion of any pathology there from the fall as patient was not endorsing any back pain during the exam.  Will give patient knee sleeves and plan on orthopedic follow-up as my independent rotation of the x-rays do show severe arthritis open most likely need orthopedic intervention.  X-rays came back showing severe arthritis in bilateral knees with large effusions.  Patient is endorsing infectious symptoms and is able to bear weight and does not have erythema that indicate need for arthrocentesis at this  time.  Spine x-ray does also show severe arthritis as well is minor anterolisthesis of L3 on L4.  Will patient follow-up with orthopedics and neurosurgery for these findings.  Encouraged Tylenol every 6 hours needed for pain along with ice rest and compression wraps.  Patient was given return precautions. Patient stable for discharge at this time.  Patient verbalized understanding of plan.  This chart was dictated using voice recognition software.  Despite best  efforts to proofread,  errors can occur which can change the documentation meaning.         Final Clinical Impression(s) / ED Diagnoses Final diagnoses:  None    Rx / DC Orders ED Discharge Orders     None         Remi Deter 10/13/23 1651    Lorre Nick, MD 10/14/23 [id number]

## 2023-10-14 ENCOUNTER — Encounter: Payer: Self-pay | Admitting: Orthopedic Surgery

## 2023-10-14 ENCOUNTER — Non-Acute Institutional Stay (SKILLED_NURSING_FACILITY): Payer: Medicare Other | Admitting: Orthopedic Surgery

## 2023-10-14 DIAGNOSIS — W19XXXD Unspecified fall, subsequent encounter: Secondary | ICD-10-CM | POA: Diagnosis not present

## 2023-10-14 DIAGNOSIS — G8929 Other chronic pain: Secondary | ICD-10-CM

## 2023-10-14 DIAGNOSIS — I10 Essential (primary) hypertension: Secondary | ICD-10-CM | POA: Diagnosis not present

## 2023-10-14 DIAGNOSIS — I358 Other nonrheumatic aortic valve disorders: Secondary | ICD-10-CM | POA: Diagnosis not present

## 2023-10-14 DIAGNOSIS — E113293 Type 2 diabetes mellitus with mild nonproliferative diabetic retinopathy without macular edema, bilateral: Secondary | ICD-10-CM | POA: Diagnosis not present

## 2023-10-14 DIAGNOSIS — M25562 Pain in left knee: Secondary | ICD-10-CM | POA: Diagnosis not present

## 2023-10-14 DIAGNOSIS — M25561 Pain in right knee: Secondary | ICD-10-CM

## 2023-10-14 DIAGNOSIS — M4316 Spondylolisthesis, lumbar region: Secondary | ICD-10-CM

## 2023-10-14 MED ORDER — PREDNISONE 20 MG PO TABS: ORAL_TABLET | ORAL | Status: DC

## 2023-10-14 NOTE — Progress Notes (Signed)
Location:  [facility] Room Number: 160-A Place of Service:  SNF (31) Provider:  Arvind Mexicano Tawny Asal, MD  Patient Care Team: Thana Ates, MD as PCP - General (Internal Medicine)  Extended Emergency Contact Information Primary Emergency Contact: trotter,[name] Mobile Phone: [phone] Relation: Daughter  Code Status:   Goals of care: Advanced Directive information    10/14/2023   12:33 PM  Advanced Directives  Does Patient Have a Medical Advance Directive? Yes  Type of Advance Directive Living will;Out of facility DNR (pink MOST or yellow form)  Does patient want to make changes to medical advance directive? No - Patient declined  Pre-existing out of facility DNR order (yellow form or pink MOST form) Pink MOST form placed in chart (order not valid for inpatient use)     Chief Complaint  Patient presents with   Acute Visit    ED follow-up    HPI:  Pt is a 89 y.o. female seen today for acute visit due to recent fall.   Admitted to [facility] rehabilitation unit yesterday due to weakness and ongoing knee/back pain. PMH: aortic stenosis, HTN, HLD venous insufficiency, T2DM, and obesity.   12/15 she fell at church. Reports knees giving out. She had increased pain to R>L knee and lower back after event. No injury to head. In the ED, xray to bilateral knees showed severe arthritis and large effusions. Xray spine noted anterolisthesis to L4, L5, new minor anterolisthesis to L3 and L4, moderate to severe arthritis to L5-S1, L4-L5 OA and left disc space narrowing to L1-2 and L2-3. She was advised to f/u outpatient with orthopedics and neurosurgery.   Today, she reports unchanged pain to bilateral knees and lower back. She is able to transfer from wheelchair to commode with 1+ assist. Pain to back and knees described as sharp, no radiation. No loss of B&B.  She has seen orthopedics in the past for steroid injections to knees. She has not  seen them in over 6 months. She reports falling in [place] about 20 years ago and having back injury at that time. Nursing is wrapping knees with ice which has helped with pain. PT/OT ordered. [place] back [place] when [place]. Afebrile. Vitals stable.    Past Medical History:  Diagnosis Date   Diabetes (HCC)    HLD (hyperlipidemia)    HTN (hypertension)    Hypercholesteremia    Low back pain    Obesities, morbid (HCC)    Past Surgical History:  Procedure Laterality Date   APPENDECTOMY     TONSILLECTOMY      Allergies  Allergen Reactions   Arthro-Complex [Nutritional Supplements]    Empagliflozin Other (See Comments)    Other reaction(s): yeast infection   Levaquin [Levofloxacin In D5w]    Levofloxacin Other (See Comments)   Lipitor [Atorvastatin]    Metformin Hcl Other (See Comments)    Other reaction(s): stomach cramps   Penicillin G     Other reaction(s): throat closing up   Penicillins    Terbinafine Other (See Comments)    Other reaction(s): Unknown    Outpatient Encounter Medications as of 10/14/2023  Medication Sig   acetaminophen (TYLENOL) 500 MG tablet 2 tabs as needed   atorvastatin (LIPITOR) 20 MG tablet Take 20 mg by mouth daily.   diltiazem (TIAZAC) 180 MG 24 hr capsule Take by mouth.   glipiZIDE (GLUCOTROL XL) 2.5 MG 24 hr tablet Take 2.5 mg by mouth every morning.  Multiple Vitamin (MULTIVITAMIN ADULT PO) Take by mouth.  1 tablet; oral Once A Morning;   ACCU-CHEK GUIDE test strip chheck blood sugars ONCE daily, alternating morning AND evening (Patient not taking: Reported on 10/14/2023)   Accu-Chek Softclix Lancets lancets daily. (Patient not taking: Reported on 10/14/2023)   aspirin 81 MG tablet Take 81 mg by mouth daily. (Patient not taking: Reported on 10/14/2023)   Aspirin-Calcium Carbonate 81-777 MG TABS Take by mouth. (Patient not taking: Reported on 10/14/2023)   atorvastatin (LIPITOR) 20 MG tablet  (Patient not taking:  Reported on 10/14/2023)   betamethasone dipropionate 0.05 % cream  (Patient not taking: Reported on 10/14/2023)   Blood Glucose Monitoring Suppl (ACCU-CHEK GUIDE) w/Device KIT Check glucose levels ONCE a DAY (Patient not taking: Reported on 10/14/2023)   cholecalciferol (VITAMIN D) 1000 UNITS tablet Take 1,000 Units by mouth daily. (Patient not taking: Reported on 10/14/2023)   clotrimazole (LOTRIMIN) 1 % cream 1 application (Patient not taking: Reported on 10/14/2023)   clotrimazole (LOTRIMIN) 1 % cream  (Patient not taking: Reported on 10/14/2023)   clotrimazole-betamethasone (LOTRISONE) cream  (Patient not taking: Reported on 10/14/2023)   COVID-19 mRNA vaccine 2023-2024 (COMIRNATY) syringe Inject into the muscle. (Patient not taking: Reported on 10/14/2023)   diclofenac (VOLTAREN) 75 MG EC tablet TAKE 1 TABLET BY MOUTH TWICE DAILY. (Patient not taking: Reported on 10/14/2023)   diclofenac (VOLTAREN) 75 MG EC tablet  (Patient not taking: Reported on 10/14/2023)   Difluprednate (DUREZOL) 0.05 % EMUL  (Patient not taking: Reported on 10/14/2023)   diltiazem (CARDIZEM CD) 180 MG 24 hr capsule Take 1 capsule (180 mg total) by mouth daily.   estradiol (ESTRACE) 0.1 MG/GM vaginal cream  (Patient not taking: Reported on 10/14/2023)   fluconazole (DIFLUCAN) 200 MG tablet  (Patient not taking: Reported on 10/14/2023)   furosemide (LASIX) 40 MG tablet Take by mouth. (Patient not taking: Reported on 10/14/2023)   influenza vaccine adjuvanted (FLUAD QUADRIVALENT) 0.5 ML injection Inject into the muscle. (Patient not taking: Reported on 10/14/2023)   JARDIANCE 10 MG TABS tablet Take 10 mg by mouth daily. (Patient not taking: Reported on 10/14/2023)   metFORMIN (GLUCOPHAGE) 1000 MG tablet Take by mouth. (Patient not taking: Reported on 10/14/2023)   metFORMIN (GLUCOPHAGE-XR) 500 MG 24 hr tablet Take 500 mg by mouth 2 (two) times daily. (Patient not taking: Reported on 10/14/2023)    olmesartan-hydrochlorothiazide (BENICAR HCT) 40-25 MG per tablet Take 1 tablet by mouth daily.   OZEMPIC, 0.25 OR 0.5 MG/DOSE, 2 MG/3ML SOPN Inject into the skin. (Patient not taking: Reported on 10/14/2023)   potassium chloride SA (KLOR-CON) 20 MEQ tablet Take by mouth. (Patient not taking: Reported on 10/14/2023)   RYBELSUS 14 MG TABS Take 1 tablet by mouth every morning. (Patient not taking: Reported on 10/14/2023)   simvastatin (ZOCOR) 20 MG tablet Take 20 mg by mouth every evening. (Patient not taking: Reported on 10/14/2023)   No facility-administered encounter medications on file as of 10/14/2023.    Review of Systems  Constitutional:  Positive for activity change. Negative for appetite change and fever.  HENT:  Negative for hearing loss, sore throat and trouble swallowing.   Eyes:  Negative for visual disturbance.  Respiratory:  Negative for cough, shortness of breath and wheezing.   Cardiovascular:  Positive for leg swelling. Negative for chest pain.  Gastrointestinal:  Negative for abdominal distention and abdominal pain.  Genitourinary:  Negative for dysuria and frequency.  Musculoskeletal:  Positive for arthralgias, back pain and gait  problem.  Skin:  Negative for wound.  Neurological:  Positive for weakness. Negative for dizziness and headaches.  Psychiatric/Behavioral:  Negative for confusion, dysphoric mood and sleep disturbance. The patient is not nervous/anxious.     Immunization History  Administered Date(s) Administered   Fluad Quad(high Dose 65+) 07/28/2022   Influenza Split 07/26/2009, 07/31/2010, 08/13/2011, 09/02/2012, 09/20/2013, 08/04/2019, 08/24/2020   Influenza,inj,Quad PF,6+ Mos 07/29/2017   Moderna Sars-Covid-2 Vaccination 11/22/2019, 12/06/2019, 08/31/2020   PNEUMOCOCCAL CONJUGATE-20 11/24/2022   Pfizer(Comirnaty)Fall Seasonal Vaccine 12 years and older 08/12/2022   Pneumococcal Conjugate-13 10/02/2014   Pneumococcal Polysaccharide-23 03/21/2009,  04/22/2017   Td 03/21/2009   Tdap 08/18/2019   Zoster, Live 03/15/2013, 09/13/2019, 03/06/2020   Pertinent  Health Maintenance Due  Topic Date Due   HEMOGLOBIN A1C  Never done   OPHTHALMOLOGY EXAM  Never done   DEXA SCAN  Never done   INFLUENZA VACCINE  05/28/2023   FOOT EXAM  08/25/2024       No data to display         Functional Status Survey:    Vitals:   10/14/23 1232  BP: 105/65  Pulse: 65  Resp: 14  Temp: 97.6 F (36.4 C)  SpO2: 96%  Weight: 200 lb (90.7 kg)  Height: 5\' 5"  (1.651 m)   Body mass index is 33.28 kg/m. Physical Exam Vitals reviewed.  Constitutional:      General: She is not in acute distress. HENT:     Head: Normocephalic and atraumatic.  Eyes:     General:        Right eye: No discharge.        Left eye: No discharge.  Cardiovascular:     Rate and Rhythm: Normal rate and regular rhythm.     Pulses: Normal pulses.     Heart sounds: Murmur heard.  Pulmonary:     Effort: Pulmonary effort is normal. No respiratory distress.     Breath sounds: Normal breath sounds. No wheezing or rales.  Abdominal:     General: Bowel sounds are normal.     Palpations: Abdomen is soft.  Musculoskeletal:     Cervical back: Neck supple.     Right lower leg: Edema present.     Left lower leg: Edema present.     Comments: Non pitting  Skin:    General: Skin is warm.     Capillary Refill: Capillary refill takes less than 2 seconds.  Neurological:     General: No focal deficit present.     Mental Status: She is alert and oriented to person, place, and time.     Motor: Weakness present.     Gait: Gait abnormal.     Comments: Wheelchair/walker  Psychiatric:        Mood and Affect: Mood normal.     Labs reviewed: No results for input(s): "NA", "K", "CL", "CO2", "GLUCOSE", "BUN", "CREATININE", "CALCIUM", "MG", "PHOS" in the last 8760 hours. No results for input(s): "AST", "ALT", "ALKPHOS", "BILITOT", "PROT", "ALBUMIN" in the last 8760 hours. No results  for input(s): "WBC", "NEUTROABS", "HGB", "HCT", "MCV", "PLT" in the last 8760 hours. No results found for: "TSH" No results found for: "HGBA1C" No results found for: "CHOL", "HDL", "LDLCALC", "LDLDIRECT", "TRIG", "CHOLHDL"  Significant Diagnostic Results in last 30 days:  DG Lumbar Spine Complete Result Date: 10/13/2023 CLINICAL DATA:  Larey Seat 2 days ago inserts. EXAM: LUMBAR SPINE - COMPLETE 4+ VIEW COMPARISON:  CT abdomen and pelvis 01/24/2006 FINDINGS: There are 5 non-rib-bearing lumbar-type vertebral bodies.  There is 5 mm grade 1 anterolisthesis of L4 on L5, similar to prior 2007 CT. On the prior CT that was degenerative in etiology without a pars defects seen. Minimal 1-2 mm grade 1 anterolisthesis of L3 on L4 is new from remote 01/24/2006 CT. Mild dextrocurvature centered at L1. Mild-to-moderate left L1-2 disc space narrowing. Mild posterior L2-3 disc space narrowing. Moderate to severe L5-S1 and moderate L4-5 facet joint sclerosis and hypertrophy. Moderate bilateral sacroiliac subchondral sclerosis. Bilateral inferior sacroiliac vacuum phenomenon, similar to prior and degenerative. Aortic and splenic arterial calcifications. IMPRESSION: 1. Grade 1 anterolisthesis of L4 on L5, similar to prior 2007 CT. 2. Minimal grade 1 anterolisthesis of L3 on L4, new from remote 01/24/2006 CT. 3. Moderate to severe L5-S1 and moderate L4-5 facet joint osteoarthritis. 4. Mild-to-moderate left L1-2 mild posterior L2-3 disc space narrowing. Electronically Signed   By: Neita Garnet M.D.   On: 10/13/2023 16:10   DG Knee Complete 4 Views Right Result Date: 10/13/2023 CLINICAL DATA:  [place] 2 days [place].  Bilateral knee pain. EXAM: RIGHT KNEE - COMPLETE 4+ VIEW COMPARISON:  None Available. FINDINGS: There is diffuse decreased bone mineralization. Severe lateral compartment joint space narrowing with bone-on-bone contact. There is also partial bone-on-bone contact of the mid aspect of the medial compartment. There is  approximately 10 mm lateralization of the proximal tibia with respect of the distal femur. Severe patellofemoral joint space narrowing and peripheral osteophytosis. Moderate to large joint effusion. There is a 14 mm ossicles appear to the patellofemoral joint on lateral view. Small ossicles measuring up to 10 mm overlying the posterior knee joint on lateral view, possible loose bodies. No acute fracture or dislocation. IMPRESSION: 1. Severe lateral compartment and patellofemoral compartment osteoarthritis. 2. Moderate to large joint effusion. 3. Possible loose bodies. Electronically Signed   By: Neita Garnet M.D.   On: 10/13/2023 16:07   DG Knee Complete 4 Views Left Result Date: 10/13/2023 CLINICAL DATA:  [place]arey Seat 2 days ago at church.  Bilateral knee pain. EXAM: LEFT KNEE - COMPLETE 4+ VIEW COMPARISON:  None Available. FINDINGS: There is diffuse decreased bone mineralization. Severe lateral compartment joint space narrowing and bone-on-bone contact. Moderate peripheral lateral degenerative osteophytosis. Moderate to severe patellofemoral joint space narrowing and peripheral osteophytosis. Moderate posterior femoral condyle degenerative spurring. Multiple possible loose bodies overlying the posterior knee on lateral view, measuring up to 9 mm. Moderate medial compartment joint space narrowing. No acute fracture or dislocation. IMPRESSION: 1. Severe lateral compartment and moderate to severe patellofemoral compartment osteoarthritis. 2. Multiple possible loose bodies overlying the posterior knee on lateral view, measuring up to 9 mm. 3. No acute fracture is seen. Electronically Signed   By: Neita Garnet M.D.   On: 10/13/2023 16:05    Assessment/Plan 1. Chronic pain of both knees (Primary) - 12/15 fall at church> knees gave out - h/o chronic knee arthritis for years - xrays revealed severe arthritis and effusion - cont tylenol and knee wraps - start prednisone taper x 10 days - schedule with ortho  2.  Anterolisthesis of lumbar spine - h/o fall with back injury about 20 years ago - x ray anterolisthesis to L4, L5, new minor anterolisthesis to L3 and L4 - start prednisone taper - schedule with neurosurgery - avoid bending, lifting, twisting mechanics  3. Fall, subsequent encounter - see above - no injury to head - PT/OT evaluation  4. Essential hypertension - controlled - cont olmesartan-hydrochlorothiazide and cardizem  5. Type 2 diabetes mellitus with  both eyes affected by mild nonproliferative retinopathy without macular edema, without long-term current use of insulin (HCC) - no recent A1c in Epic  - cont glipizide  6. Aortic valve sclerosis    Family/ staff Communication: plan discussed with patient and nurse  Labs/tests ordered:  none

## 2023-10-16 DIAGNOSIS — Z9181 History of falling: Secondary | ICD-10-CM | POA: Diagnosis not present

## 2023-10-16 DIAGNOSIS — M6259 Muscle wasting and atrophy, not elsewhere classified, multiple sites: Secondary | ICD-10-CM | POA: Diagnosis not present

## 2023-10-16 DIAGNOSIS — R2689 Other abnormalities of gait and mobility: Secondary | ICD-10-CM | POA: Diagnosis not present

## 2023-10-16 DIAGNOSIS — R278 Other lack of coordination: Secondary | ICD-10-CM | POA: Diagnosis not present

## 2023-10-19 ENCOUNTER — Encounter: Payer: Self-pay | Admitting: Internal Medicine

## 2023-10-19 ENCOUNTER — Telehealth: Payer: Medicare Other

## 2023-10-19 ENCOUNTER — Non-Acute Institutional Stay (SKILLED_NURSING_FACILITY): Payer: Medicare Other | Admitting: Internal Medicine

## 2023-10-19 DIAGNOSIS — E113293 Type 2 diabetes mellitus with mild nonproliferative diabetic retinopathy without macular edema, bilateral: Secondary | ICD-10-CM | POA: Diagnosis not present

## 2023-10-19 DIAGNOSIS — M25561 Pain in right knee: Secondary | ICD-10-CM | POA: Diagnosis not present

## 2023-10-19 DIAGNOSIS — M4316 Spondylolisthesis, lumbar region: Secondary | ICD-10-CM | POA: Diagnosis not present

## 2023-10-19 DIAGNOSIS — I10 Essential (primary) hypertension: Secondary | ICD-10-CM | POA: Diagnosis not present

## 2023-10-19 DIAGNOSIS — M25562 Pain in left knee: Secondary | ICD-10-CM

## 2023-10-19 DIAGNOSIS — M1710 Unilateral primary osteoarthritis, unspecified knee: Secondary | ICD-10-CM

## 2023-10-19 DIAGNOSIS — E1142 Type 2 diabetes mellitus with diabetic polyneuropathy: Secondary | ICD-10-CM

## 2023-10-19 DIAGNOSIS — G8929 Other chronic pain: Secondary | ICD-10-CM

## 2023-10-19 NOTE — Telephone Encounter (Signed)
Talked to daughter

## 2023-10-19 NOTE — Progress Notes (Unsigned)
Provider:   Location:  [facility] Room Number: 160A Place of Service:  SNF (31)  PCP: Thana Ates, MD Patient Care Team: Thana Ates, MD as PCP - General (Internal Medicine)  Extended Emergency Contact Information Primary Emergency Contact: trotter,[name] Mobile Phone: [phone] Relation: Daughter  Code Status:  Goals of Care: Advanced Directive information    10/19/2023   11:35 AM  Advanced Directives  Does Patient Have a Medical Advance Directive? Yes  Type of Advance Directive Living will;Out of facility DNR (pink MOST or yellow form)  Does patient want to make changes to medical advance directive? No - Patient declined  Pre-existing out of facility DNR order (yellow form or pink MOST form) Pink MOST form placed in chart (order not valid for inpatient use)      Chief Complaint  Patient presents with   New Admit To SNF    Patient is being seen for new admit    HPI: Patient is a 89 y.o. female seen today for admission to Admit to Rehab  Lives in [place]  Patient has a history of hypertension, HLD, type 2 diabetes with neuropathy  Patient also has a history of bilateral knee arthritis.  History obtained from reviewing the records,Discussing with therapy and Her daughter Patient has severe arthritis of her knee and the past few months has been becoming less mobile.  Prior therapies in [facility] patient has become more slower.  But was able to walk with her walker.  Is still driving. She was in her church with her daughter when she says her legs just gave away and she had to sit down. since then patient has been unable to ambulate  She went to ED where x-rays showed severe arthritis in the Both  knee large joint effusion in right knee  moderate to severe arthritis in the lower lumbar area    She is now in rehab. \She was started on low-dose of prednisone for few days.  She says that that has helped her pain but she is not  ambulating and the nurses are now using stand up lift for her transfers. Patient has not started therapy yet as awaiting for confirmation from her insurance. Patient did not have any other issues  Talked to daughter her POA She also has 2 sone who are physicians  Past Medical History:  Diagnosis Date   Diabetes (HCC)    HLD (hyperlipidemia)    HTN (hypertension)    Hypercholesteremia    Low back pain    Obesities, morbid (HCC)    Past Surgical History:  Procedure Laterality Date   APPENDECTOMY     TONSILLECTOMY      reports that she has never smoked. She has never used smokeless tobacco. No history on file for alcohol use and drug use. Social History   Socioeconomic History   Marital status: Married    Spouse name: Not on file   Number of children: Not on file   Years of education: Not on file   Highest education level: Not on file  Occupational History   Not on file  Tobacco Use   Smoking status: Never   Smokeless tobacco: Never  Substance and Sexual Activity   Alcohol use: Not on file   Drug use: Not on file   Sexual activity: Not on file  Other Topics Concern   Not on file  Social History Narrative   Not on file   Social Drivers of Health  Financial Resource Strain: Not on file  Food Insecurity: Not on file  Transportation Needs: Not on file  Physical Activity: Not on file  Stress: Not on file  Social Connections: Not on file  Intimate Partner Violence: Not on file    Functional Status Survey:    Family History  Problem Relation Age of Onset   Heart disease Other     Health Maintenance  Topic Date Due   HEMOGLOBIN A1C  Never done   OPHTHALMOLOGY EXAM  Never done   Zoster Vaccines- Shingrix (1 of 2) 05/23/1953   DEXA SCAN  Never done   INFLUENZA VACCINE  05/28/2023   COVID-19 Vaccine (5 - 2024-25 season) 06/28/2023   Medicare Annual Wellness (AWV)  11/25/2023   FOOT EXAM  08/25/2024   DTaP/Tdap/Td (3 - Td or Tdap) 08/17/2029   Pneumonia  Vaccine 63+ Years old  Completed   HPV VACCINES  Aged Out    Allergies  Allergen Reactions   Arthro-Complex [Nutritional Supplements]    Empagliflozin Other (See Comments)    Other reaction(s): yeast infection   Levaquin [Levofloxacin In D5w]    Levofloxacin Other (See Comments)   Lipitor [Atorvastatin]    Metformin Hcl Other (See Comments)    Other reaction(s): stomach cramps   Penicillin G     Other reaction(s): throat closing up   Penicillins    Terbinafine Other (See Comments)    Other reaction(s): Unknown    Outpatient Encounter Medications as of 10/19/2023  Medication Sig   ACCU-CHEK GUIDE test strip chheck blood sugars ONCE daily, alternating morning AND evening (Patient not taking: Reported on 10/19/2023)   Accu-Chek Softclix Lancets lancets daily. (Patient not taking: Reported on 10/19/2023)   acetaminophen (TYLENOL) 500 MG tablet 2 tabs as needed   aspirin 81 MG tablet Take 81 mg by mouth daily. (Patient not taking: Reported on 10/19/2023)   Aspirin-Calcium Carbonate 81-777 MG TABS Take by mouth. (Patient not taking: Reported on 10/19/2023)   atorvastatin (LIPITOR) 20 MG tablet Take 20 mg by mouth daily.   atorvastatin (LIPITOR) 20 MG tablet  (Patient not taking: Reported on 10/19/2023)   betamethasone dipropionate 0.05 % cream  (Patient not taking: Reported on 10/19/2023)   Blood Glucose Monitoring Suppl (ACCU-CHEK GUIDE) w/Device KIT Check glucose levels ONCE a DAY (Patient not taking: Reported on 10/19/2023)   cholecalciferol (VITAMIN D) 1000 UNITS tablet Take 1,000 Units by mouth daily. (Patient not taking: Reported on 10/19/2023)   clotrimazole (LOTRIMIN) 1 % cream 1 application (Patient not taking: Reported on 10/19/2023)   clotrimazole (LOTRIMIN) 1 % cream  (Patient not taking: Reported on 10/19/2023)   clotrimazole-betamethasone (LOTRISONE) cream  (Patient not taking: Reported on 10/19/2023)   COVID-19 mRNA vaccine 2023-2024 (COMIRNATY) syringe Inject into the  muscle. (Patient not taking: Reported on 10/19/2023)   diclofenac (VOLTAREN) 75 MG EC tablet TAKE 1 TABLET BY MOUTH TWICE DAILY. (Patient not taking: Reported on 10/19/2023)   diclofenac (VOLTAREN) 75 MG EC tablet  (Patient not taking: Reported on 10/19/2023)   Difluprednate (DUREZOL) 0.05 % EMUL  (Patient not taking: Reported on 10/19/2023)   diltiazem (CARDIZEM CD) 180 MG 24 hr capsule Take 1 capsule (180 mg total) by mouth daily.   diltiazem (TIAZAC) 180 MG 24 hr capsule Take by mouth. (Patient not taking: Reported on 10/19/2023)   estradiol (ESTRACE) 0.1 MG/GM vaginal cream  (Patient not taking: Reported on 10/19/2023)   fluconazole (DIFLUCAN) 200 MG tablet  (Patient not taking: Reported on 10/19/2023)   furosemide (LASIX)  40 MG tablet Take by mouth. (Patient not taking: Reported on 10/19/2023)   glipiZIDE (GLUCOTROL XL) 2.5 MG 24 hr tablet Take 2.5 mg by mouth every morning.   influenza vaccine adjuvanted (FLUAD QUADRIVALENT) 0.5 ML injection Inject into the muscle. (Patient not taking: Reported on 10/19/2023)   JARDIANCE 10 MG TABS tablet Take 10 mg by mouth daily. (Patient not taking: Reported on 10/19/2023)   metFORMIN (GLUCOPHAGE) 1000 MG tablet Take by mouth. (Patient not taking: Reported on 10/19/2023)   metFORMIN (GLUCOPHAGE-XR) 500 MG 24 hr tablet Take 500 mg by mouth 2 (two) times daily. (Patient not taking: Reported on 10/19/2023)   Multiple Vitamin (MULTIVITAMIN ADULT PO) Take by mouth.  1 tablet; oral Once A Morning;   olmesartan-hydrochlorothiazide (BENICAR HCT) 40-25 MG per tablet Take 1 tablet by mouth daily.   OZEMPIC, 0.25 OR 0.5 MG/DOSE, 2 MG/3ML SOPN Inject into the skin. (Patient not taking: Reported on 10/19/2023)   potassium chloride SA (KLOR-CON) 20 MEQ tablet Take by mouth. (Patient not taking: Reported on 10/19/2023)   predniSONE (DELTASONE) 20 MG tablet Take 2 tablets (40 mg total) by mouth daily with breakfast for 5 days, THEN 1 tablet (20 mg total) daily with  breakfast for 5 days. (Patient not taking: Reported on 10/19/2023)   RYBELSUS 14 MG TABS Take 1 tablet by mouth every morning. (Patient not taking: Reported on 10/19/2023)   simvastatin (ZOCOR) 20 MG tablet Take 20 mg by mouth every evening. (Patient not taking: Reported on 10/19/2023)   No facility-administered encounter medications on file as of 10/19/2023.    Review of Systems  Constitutional:  Positive for activity change. Negative for appetite change.  HENT: Negative.    Respiratory:  Negative for cough and shortness of breath.   Cardiovascular:  Negative for leg swelling.  Gastrointestinal:  Negative for constipation.  Genitourinary: Negative.   Musculoskeletal:  Positive for arthralgias, gait problem and myalgias.  Skin: Negative.   Neurological:  Positive for weakness. Negative for dizziness.  Psychiatric/Behavioral:  Negative for confusion, dysphoric mood and sleep disturbance.     Vitals:   10/19/23 1102  BP: (!) 107/52  Pulse: 60  Resp: 20  Temp: 98.1 F (36.7 C)  TempSrc: Temporal  SpO2: 96%  Weight: 200 lb (90.7 kg)  Height: 5\' 5"  (1.651 m)   Body mass index is 33.28 kg/m. Physical Exam Vitals reviewed.  Constitutional:      Appearance: Normal appearance.  HENT:     Head: Normocephalic.     Nose: Nose normal.     Mouth/Throat:     Mouth: Mucous membranes are moist.     Pharynx: Oropharynx is clear.  Eyes:     Pupils: Pupils are equal, round, and reactive to light.  Cardiovascular:     Rate and Rhythm: Normal rate and regular rhythm.     Pulses: Normal pulses.     Heart sounds: Murmur heard.  Pulmonary:     Effort: Pulmonary effort is normal.     Breath sounds: Normal breath sounds.  Abdominal:     General: Abdomen is flat. Bowel sounds are normal.     Palpations: Abdomen is soft.  Musculoskeletal:        General: No swelling.     Cervical back: Neck supple.  Skin:    General: Skin is warm.  Neurological:     General: No focal deficit present.      Mental Status: She is alert and oriented to person, place, and time.  Comments: Good strength in all extremities Unable to stand up from the Wheelchair with her walker  Psychiatric:        Mood and Affect: Mood normal.        Thought Content: Thought content normal.     Labs reviewed: Basic Metabolic Panel: No results for input(s): "NA", "K", "CL", "CO2", "GLUCOSE", "BUN", "CREATININE", "CALCIUM", "MG", "PHOS" in the last 8760 hours. Liver Function Tests: No results for input(s): "AST", "ALT", "ALKPHOS", "BILITOT", "PROT", "ALBUMIN" in the last 8760 hours. No results for input(s): "LIPASE", "AMYLASE" in the last 8760 hours. No results for input(s): "AMMONIA" in the last 8760 hours. CBC: No results for input(s): "WBC", "NEUTROABS", "HGB", "HCT", "MCV", "PLT" in the last 8760 hours. Cardiac Enzymes: No results for input(s): "CKTOTAL", "CKMB", "CKMBINDEX", "TROPONINI" in the last 8760 hours. BNP: Invalid input(s): "POCBNP" No results found for: "HGBA1C" No results found for: "TSH" No results found for: "VITAMINB12" No results found for: "FOLATE" No results found for: "IRON", "TIBC", "FERRITIN"  Imaging and Procedures obtained prior to SNF admission: DG Lumbar Spine Complete Result Date: 10/13/2023 CLINICAL DATA:  Larey Seat 2 days ago inserts. EXAM: LUMBAR SPINE - COMPLETE 4+ VIEW COMPARISON:  CT abdomen and pelvis 01/24/2006 FINDINGS: There are 5 non-rib-bearing lumbar-type vertebral bodies. There is 5 mm grade 1 anterolisthesis of L4 on L5, similar to prior 2007 CT. On the prior CT that was degenerative in etiology without a pars defects seen. Minimal 1-2 mm grade 1 anterolisthesis of L3 on L4 is new from remote 01/24/2006 CT. Mild dextrocurvature centered at L1. Mild-to-moderate left L1-2 disc space narrowing. Mild posterior L2-3 disc space narrowing. Moderate to severe L5-S1 and moderate L4-5 facet joint sclerosis and hypertrophy. Moderate bilateral sacroiliac subchondral sclerosis.  Bilateral inferior sacroiliac vacuum phenomenon, similar to prior and degenerative. Aortic and splenic arterial calcifications. IMPRESSION: 1. Grade 1 anterolisthesis of L4 on L5, similar to prior 2007 CT. 2. Minimal grade 1 anterolisthesis of L3 on L4, new from remote 01/24/2006 CT. 3. Moderate to severe L5-S1 and moderate L4-5 facet joint osteoarthritis. 4. Mild-to-moderate left L1-2 mild posterior L2-3 disc space narrowing. Electronically Signed   By: Neita Garnet M.D.   On: 10/13/2023 16:10   DG Knee Complete 4 Views Right Result Date: 10/13/2023 CLINICAL DATA:  Larey Seat 2 days ago at church.  Bilateral knee pain. EXAM: RIGHT KNEE - COMPLETE 4+ VIEW COMPARISON:  None Available. FINDINGS: There is diffuse decreased bone mineralization. Severe lateral compartment joint space narrowing with bone-on-bone contact. There is also partial bone-on-bone contact of the mid aspect of the medial compartment. There is approximately 10 mm lateralization of the proximal tibia with respect of the distal femur. Severe patellofemoral joint space narrowing and peripheral osteophytosis. Moderate to large joint effusion. There is a 14 mm ossicles appear to the patellofemoral joint on lateral view. Small ossicles measuring up to 10 mm overlying the posterior knee joint on lateral view, possible loose bodies. No acute fracture or dislocation. IMPRESSION: 1. Severe lateral compartment and patellofemoral compartment osteoarthritis. 2. Moderate to large joint effusion. 3. Possible loose bodies. Electronically Signed   By: Neita Garnet M.D.   On: 10/13/2023 16:07   DG Knee Complete 4 Views Left Result Date: 10/13/2023 CLINICAL DATA:  Larey Seat 2 days ago at church.  Bilateral knee pain. EXAM: LEFT KNEE - COMPLETE 4+ VIEW COMPARISON:  None Available. FINDINGS: There is diffuse decreased bone mineralization. Severe lateral compartment joint space narrowing and bone-on-bone contact. Moderate peripheral lateral degenerative osteophytosis.  Moderate to severe  patellofemoral joint space narrowing and peripheral osteophytosis. Moderate posterior femoral condyle degenerative spurring. Multiple possible loose bodies overlying the posterior knee on lateral view, measuring up to 9 mm. Moderate medial compartment joint space narrowing. No acute fracture or dislocation. IMPRESSION: 1. Severe lateral compartment and moderate to severe patellofemoral compartment osteoarthritis. 2. Multiple possible loose bodies overlying the posterior knee on lateral view, measuring up to 9 mm. 3. No acute fracture is seen. Electronically Signed   By: Neita Garnet M.D.   On: 10/13/2023 16:05    Assessment/Plan 1. Arthritis of knee (Primary) Most likely cause for her not able to bear weight on her Legs She had good proximal strength in her Legs Got small course of Prednisone to which helped with pain   Chronic pain of both knees Will also write for PRN Meloxicam which =she was taking before  Discussed with Daughter in detail Patient's family does not want her to get surgery.  I did recommend to go and see orthopedics for Knee Injection  .  Patient has failed before and the daughter does not know does not feel comfortable at this point to take her to Ortho   We will wait for therapy to start.  If patient continues to have pain she will go and follow-up with Ortho.  The therapy will also evaluate her for power chair.  Her goal is to go back to her apartment  2. Anterolisthesis of lumbar spine Meloxicam PRN 3 HLD On statin 4. Essential hypertension On  Benicar and Cardizem  5. Type 2 diabetes mellitus with both eyes affected by mild nonproliferative retinopathy without macular edema, without long-term current use of insulin (HCC) Ozempic and Glipizide  6. Polyneuropathy due to type 2 diabetes mellitus (HCC)     Family/ staff Communication:   Labs/tests ordered:CMP,CBC

## 2023-10-19 NOTE — Telephone Encounter (Signed)
Spoke with patient's daughter this afternoon because she is aware that Dr.Gupta saw her mother today at [facility].  Patient's daughter states that she does have some concerns about her mother mobility because she had a fall two days ago at church and was seen in the [facility] room and to ask Dr. Chales Abrahams what is the next step on getting her mother back on her feet to become mobility.  Also she would like to speak to Dr. Chales Abrahams her is listed in the Note:  Mrs, Patricia Petersen [phone]  Message sent to Mahlon Gammon, MD

## 2023-10-20 DIAGNOSIS — Z9181 History of falling: Secondary | ICD-10-CM | POA: Diagnosis not present

## 2023-10-20 DIAGNOSIS — D649 Anemia, unspecified: Secondary | ICD-10-CM | POA: Diagnosis not present

## 2023-10-20 DIAGNOSIS — R2689 Other abnormalities of gait and mobility: Secondary | ICD-10-CM | POA: Diagnosis not present

## 2023-10-20 DIAGNOSIS — M6259 Muscle wasting and atrophy, not elsewhere classified, multiple sites: Secondary | ICD-10-CM | POA: Diagnosis not present

## 2023-10-20 MED ORDER — DILTIAZEM HCL ER COATED BEADS 180 MG PO CP24: 180.0000 mg | ORAL_CAPSULE | Freq: Every day | ORAL | Status: DC

## 2023-10-20 MED ORDER — OLMESARTAN MEDOXOMIL-HCTZ 40-25 MG PO TABS: 1.0000 | ORAL_TABLET | Freq: Every day | ORAL | Status: DC

## 2023-10-22 DIAGNOSIS — R2689 Other abnormalities of gait and mobility: Secondary | ICD-10-CM | POA: Diagnosis not present

## 2023-10-22 DIAGNOSIS — R278 Other lack of coordination: Secondary | ICD-10-CM | POA: Diagnosis not present

## 2023-10-22 DIAGNOSIS — M6259 Muscle wasting and atrophy, not elsewhere classified, multiple sites: Secondary | ICD-10-CM | POA: Diagnosis not present

## 2023-10-22 DIAGNOSIS — Z9181 History of falling: Secondary | ICD-10-CM | POA: Diagnosis not present

## 2023-10-23 ENCOUNTER — Encounter: Payer: Self-pay | Admitting: Adult Health

## 2023-10-23 ENCOUNTER — Non-Acute Institutional Stay (SKILLED_NURSING_FACILITY): Payer: Medicare Other | Admitting: Adult Health

## 2023-10-23 DIAGNOSIS — F064 Anxiety disorder due to known physiological condition: Secondary | ICD-10-CM

## 2023-10-23 DIAGNOSIS — R278 Other lack of coordination: Secondary | ICD-10-CM | POA: Diagnosis not present

## 2023-10-23 DIAGNOSIS — Z9181 History of falling: Secondary | ICD-10-CM | POA: Diagnosis not present

## 2023-10-23 DIAGNOSIS — R2689 Other abnormalities of gait and mobility: Secondary | ICD-10-CM | POA: Diagnosis not present

## 2023-10-23 DIAGNOSIS — M6259 Muscle wasting and atrophy, not elsewhere classified, multiple sites: Secondary | ICD-10-CM | POA: Diagnosis not present

## 2023-10-23 NOTE — Progress Notes (Unsigned)
Location:  [facility] Room Number: 160A Place of Service:  SNF [phone]) Provider:  Tamsen Meek, MD  Patient Care Team: Thana Ates, MD as PCP - General (Internal Medicine)  Extended Emergency Contact Information Primary Emergency Contact: trotter,[name] Mobile Phone: [phone] Relation: Daughter  Code Status:  DNR Goals of care: Advanced Directive information    10/23/2023   11:59 AM  Advanced Directives  Does Patient Have a Medical Advance Directive? Yes  Type of Advance Directive Living will;Out of facility DNR (pink MOST or yellow form)  Does patient want to make changes to medical advance directive? No - Patient declined  Pre-existing out of facility DNR order (yellow form or pink MOST form) Pink MOST form placed in chart (order not valid for inpatient use)     Chief Complaint  Patient presents with  . Acute Visit    Patient is being seen for acute anxiety  . Immunizations    Patient is due for flu and covid     HPI:  Pt is a 89 y.o. female seen today for an acute visit for    Past Medical History:  Diagnosis Date  . Diabetes (HCC)   . HLD (hyperlipidemia)   . HTN (hypertension)   . Hypercholesteremia   . Low back pain   . Obesities, morbid [facility])    Past Surgical History:  Procedure Laterality Date  . APPENDECTOMY    . TONSILLECTOMY      Allergies  Allergen Reactions  . Arthro-Complex [Nutritional Supplements]   . Empagliflozin Other (See Comments)    Other reaction(s): yeast infection  . Levaquin [Levofloxacin In D5w]   . Levofloxacin Other (See Comments)  . Lipitor [Atorvastatin]   . Metformin Hcl Other (See Comments)    Other reaction(s): stomach cramps  . Penicillin G     Other reaction(s): throat closing up  . Penicillins   . Terbinafine Other (See Comments)    Other reaction(s): Unknown    Outpatient Encounter Medications as of 10/23/2023  Medication Sig  . ACCU-CHEK GUIDE  test strip   . Accu-Chek Softclix Lancets lancets daily.  Marland Kitchen acetaminophen (TYLENOL) 500 MG tablet 2 tabs as needed  . atorvastatin (LIPITOR) 20 MG tablet Take 20 mg by mouth daily.  . Blood Glucose Monitoring Suppl (ACCU-CHEK GUIDE) w/Device KIT   . cholecalciferol (VITAMIN D) 1000 UNITS tablet Take 1,000 Units by mouth daily.  Marland Kitchen diltiazem (CARDIZEM CD) 180 MG 24 hr capsule Take 1 capsule (180 mg total) by mouth daily.  Marland Kitchen glipiZIDE (GLUCOTROL XL) 2.5 MG 24 hr tablet Take 2.5 mg by mouth every morning.  . meloxicam (MOBIC) 7.5 MG tablet Take 7.5 mg by mouth daily as needed for pain.  . Multiple Vitamin (MULTIVITAMIN ADULT PO) Take by mouth.  1 tablet; oral Once A Morning;  . olmesartan-hydrochlorothiazide (BENICAR HCT) 40-25 MG tablet Take 1 tablet by mouth daily.  Marland Kitchen OZEMPIC, 0.25 OR 0.5 MG/DOSE, 2 MG/3ML SOPN Inject into the skin.   No facility-administered encounter medications on file as of 10/23/2023.    Review of Systems  Immunization History  Administered Date(s) Administered  . Fluad Quad(high Dose 65+) 07/28/2022  . Influenza Split 07/26/2009, 07/31/2010, 08/13/2011, 09/02/2012, 09/20/2013, 08/04/2019, 08/24/2020  . Influenza,inj,Quad PF,6+ Mos 07/29/2017  . Moderna Sars-Covid-2 Vaccination 11/22/2019, 12/06/2019, 08/31/2020  . PNEUMOCOCCAL CONJUGATE-20 11/24/2022  . Pfizer(Comirnaty)Fall Seasonal Vaccine 12 years and older 08/12/2022  . Pneumococcal Conjugate-13 10/02/2014  . Pneumococcal Polysaccharide-23 03/21/2009, 04/22/2017  . Td  03/21/2009  . Tdap 08/18/2019  . Zoster, Live 03/15/2013, 09/13/2019, 03/06/2020   Pertinent  Health Maintenance Due  Topic Date Due  . HEMOGLOBIN A1C  Never done  . OPHTHALMOLOGY EXAM  Never done  . DEXA SCAN  Never done  . INFLUENZA VACCINE  05/28/2023  . FOOT EXAM  08/25/2024      10/19/2023   11:34 AM  Fall Risk  Falls in the past year? 0  Was there an injury with Fall? 0  Fall Risk Category Calculator 0  Patient at Risk for  Falls Due to No Fall Risks  Fall risk Follow up Falls evaluation completed   Functional Status Survey:    Vitals:   10/23/23 1133  BP: 113/77  Pulse: 74  Resp: 20  Temp: 98.1 F (36.7 C)  TempSrc: Temporal  SpO2: 96%  Weight: 198 lb 6.4 oz (90 kg)  Height: 5\' 5"  (1.651 m)   Body mass index is 33.02 kg/m. Physical Exam  Labs reviewed: No results for input(s): "NA", "K", "CL", "CO2", "GLUCOSE", "BUN", "CREATININE", "CALCIUM", "MG", "PHOS" in the last 8760 hours. No results for input(s): "AST", "ALT", "ALKPHOS", "BILITOT", "PROT", "ALBUMIN" in the last 8760 hours. No results for input(s): "WBC", "NEUTROABS", "HGB", "HCT", "MCV", "PLT" in the last 8760 hours. No results found for: "TSH" No results found for: "HGBA1C" No results found for: "CHOL", "HDL", "LDLCALC", "LDLDIRECT", "TRIG", "CHOLHDL"  Significant Diagnostic Results in last 30 days:  DG Lumbar Spine Complete Result Date: 10/13/2023 CLINICAL DATA:  Larey Seat 2 days ago inserts. EXAM: LUMBAR SPINE - COMPLETE 4+ VIEW COMPARISON:  CT abdomen and pelvis 01/24/2006 FINDINGS: There are 5 non-rib-bearing lumbar-type vertebral bodies. There is 5 mm grade 1 anterolisthesis of L4 on L5, similar to prior 2007 CT. On the prior CT that was degenerative in etiology without a pars defects seen. Minimal 1-2 mm grade 1 anterolisthesis of L3 on L4 is new from remote 01/24/2006 CT. Mild dextrocurvature centered at L1. Mild-to-moderate left L1-2 disc space narrowing. Mild posterior L2-3 disc space narrowing. Moderate to severe L5-S1 and moderate L4-5 facet joint sclerosis and hypertrophy. Moderate bilateral sacroiliac subchondral sclerosis. Bilateral inferior sacroiliac vacuum phenomenon, similar to prior and degenerative. Aortic and splenic arterial calcifications. IMPRESSION: 1. Grade 1 anterolisthesis of L4 on L5, similar to prior 2007 CT. 2. Minimal grade 1 anterolisthesis of L3 on L4, new from remote 01/24/2006 CT. 3. Moderate to severe L5-S1 and  moderate L4-5 facet joint osteoarthritis. 4. Mild-to-moderate left L1-2 mild posterior L2-3 disc space narrowing. Electronically Signed   By: Neita Garnet M.D.   On: 10/13/2023 16:10   DG Knee Complete 4 Views Right Result Date: 10/13/2023 CLINICAL DATA:  [place]arey Seat 2 days ago at church.  Bilateral knee pain. EXAM: RIGHT KNEE - COMPLETE 4+ VIEW COMPARISON:  None Available. FINDINGS: There is diffuse decreased bone mineralization. Severe lateral compartment joint space narrowing with bone-on-bone contact. There is also partial bone-on-bone contact of the mid aspect of the medial compartment. There is approximately 10 mm lateralization of the proximal tibia with respect of the distal femur. Severe patellofemoral joint space narrowing and peripheral osteophytosis. Moderate to large joint effusion. There is a 14 mm ossicles appear to the patellofemoral joint on lateral view. Small ossicles measuring up to 10 mm overlying the posterior knee joint on lateral view, possible loose bodies. No acute fracture or dislocation. IMPRESSION: 1. Severe lateral compartment and patellofemoral compartment osteoarthritis. 2. Moderate to large joint effusion. 3. Possible loose bodies. Electronically Signed   By:  Neita Garnet M.D.   On: 10/13/2023 16:07   DG Knee Complete 4 Views Left Result Date: 10/13/2023 CLINICAL DATA:  Larey Seat 2 days ago at church.  Bilateral knee pain. EXAM: LEFT KNEE - COMPLETE 4+ VIEW COMPARISON:  None Available. FINDINGS: There is diffuse decreased bone mineralization. Severe lateral compartment joint space narrowing and bone-on-bone contact. Moderate peripheral lateral degenerative osteophytosis. Moderate to severe patellofemoral joint space narrowing and peripheral osteophytosis. Moderate posterior femoral condyle degenerative spurring. Multiple possible loose bodies overlying the posterior knee on lateral view, measuring up to 9 mm. Moderate medial compartment joint space narrowing. No acute fracture or  dislocation. IMPRESSION: 1. Severe lateral compartment and moderate to severe patellofemoral compartment osteoarthritis. 2. Multiple possible loose bodies overlying the posterior knee on lateral view, measuring up to 9 mm. 3. No acute fracture is seen. Electronically Signed   By: Neita Garnet M.D.   On: 10/13/2023 16:05    Assessment/Plan There are no diagnoses linked to this encounter.   Family/ staff Communication: ***  Labs/tests ordered:  ***

## 2023-10-24 DIAGNOSIS — R2689 Other abnormalities of gait and mobility: Secondary | ICD-10-CM | POA: Diagnosis not present

## 2023-10-24 DIAGNOSIS — Z9181 History of falling: Secondary | ICD-10-CM | POA: Diagnosis not present

## 2023-10-24 DIAGNOSIS — M6259 Muscle wasting and atrophy, not elsewhere classified, multiple sites: Secondary | ICD-10-CM | POA: Diagnosis not present

## 2023-10-25 DIAGNOSIS — R278 Other lack of coordination: Secondary | ICD-10-CM | POA: Diagnosis not present

## 2023-10-25 DIAGNOSIS — Z9181 History of falling: Secondary | ICD-10-CM | POA: Diagnosis not present

## 2023-10-26 ENCOUNTER — Encounter: Payer: Self-pay | Admitting: Adult Health

## 2023-10-26 DIAGNOSIS — M6259 Muscle wasting and atrophy, not elsewhere classified, multiple sites: Secondary | ICD-10-CM | POA: Diagnosis not present

## 2023-10-26 DIAGNOSIS — R278 Other lack of coordination: Secondary | ICD-10-CM | POA: Diagnosis not present

## 2023-10-26 DIAGNOSIS — R2689 Other abnormalities of gait and mobility: Secondary | ICD-10-CM | POA: Diagnosis not present

## 2023-10-26 DIAGNOSIS — Z9181 History of falling: Secondary | ICD-10-CM | POA: Diagnosis not present

## 2023-10-27 DIAGNOSIS — R2689 Other abnormalities of gait and mobility: Secondary | ICD-10-CM | POA: Diagnosis not present

## 2023-10-27 DIAGNOSIS — D72829 Elevated white blood cell count, unspecified: Secondary | ICD-10-CM | POA: Diagnosis not present

## 2023-10-27 DIAGNOSIS — Z9181 History of falling: Secondary | ICD-10-CM | POA: Diagnosis not present

## 2023-10-27 DIAGNOSIS — R278 Other lack of coordination: Secondary | ICD-10-CM | POA: Diagnosis not present

## 2023-10-27 DIAGNOSIS — M6259 Muscle wasting and atrophy, not elsewhere classified, multiple sites: Secondary | ICD-10-CM | POA: Diagnosis not present

## 2023-10-29 DIAGNOSIS — Z9181 History of falling: Secondary | ICD-10-CM | POA: Diagnosis not present

## 2023-10-29 DIAGNOSIS — M6259 Muscle wasting and atrophy, not elsewhere classified, multiple sites: Secondary | ICD-10-CM | POA: Diagnosis not present

## 2023-10-29 DIAGNOSIS — R2689 Other abnormalities of gait and mobility: Secondary | ICD-10-CM | POA: Diagnosis not present

## 2023-10-29 DIAGNOSIS — R278 Other lack of coordination: Secondary | ICD-10-CM | POA: Diagnosis not present

## 2023-10-29 DIAGNOSIS — M6389 Disorders of muscle in diseases classified elsewhere, multiple sites: Secondary | ICD-10-CM | POA: Diagnosis not present

## 2023-10-30 DIAGNOSIS — M6389 Disorders of muscle in diseases classified elsewhere, multiple sites: Secondary | ICD-10-CM | POA: Diagnosis not present

## 2023-10-30 DIAGNOSIS — Z9181 History of falling: Secondary | ICD-10-CM | POA: Diagnosis not present

## 2023-10-30 DIAGNOSIS — R278 Other lack of coordination: Secondary | ICD-10-CM | POA: Diagnosis not present

## 2023-10-31 DIAGNOSIS — M6259 Muscle wasting and atrophy, not elsewhere classified, multiple sites: Secondary | ICD-10-CM | POA: Diagnosis not present

## 2023-10-31 DIAGNOSIS — R2689 Other abnormalities of gait and mobility: Secondary | ICD-10-CM | POA: Diagnosis not present

## 2023-10-31 DIAGNOSIS — Z9181 History of falling: Secondary | ICD-10-CM | POA: Diagnosis not present

## 2023-11-02 DIAGNOSIS — Z5181 Encounter for therapeutic drug level monitoring: Secondary | ICD-10-CM | POA: Diagnosis not present

## 2023-11-02 DIAGNOSIS — R2689 Other abnormalities of gait and mobility: Secondary | ICD-10-CM | POA: Diagnosis not present

## 2023-11-02 DIAGNOSIS — M6259 Muscle wasting and atrophy, not elsewhere classified, multiple sites: Secondary | ICD-10-CM | POA: Diagnosis not present

## 2023-11-02 DIAGNOSIS — Z9181 History of falling: Secondary | ICD-10-CM | POA: Diagnosis not present

## 2023-11-03 DIAGNOSIS — Z9181 History of falling: Secondary | ICD-10-CM | POA: Diagnosis not present

## 2023-11-03 DIAGNOSIS — M6259 Muscle wasting and atrophy, not elsewhere classified, multiple sites: Secondary | ICD-10-CM | POA: Diagnosis not present

## 2023-11-03 DIAGNOSIS — R2689 Other abnormalities of gait and mobility: Secondary | ICD-10-CM | POA: Diagnosis not present

## 2023-11-03 DIAGNOSIS — M6389 Disorders of muscle in diseases classified elsewhere, multiple sites: Secondary | ICD-10-CM | POA: Diagnosis not present

## 2023-11-03 DIAGNOSIS — R278 Other lack of coordination: Secondary | ICD-10-CM | POA: Diagnosis not present

## 2023-11-04 DIAGNOSIS — Z9181 History of falling: Secondary | ICD-10-CM | POA: Diagnosis not present

## 2023-11-04 DIAGNOSIS — R278 Other lack of coordination: Secondary | ICD-10-CM | POA: Diagnosis not present

## 2023-11-04 DIAGNOSIS — M6389 Disorders of muscle in diseases classified elsewhere, multiple sites: Secondary | ICD-10-CM | POA: Diagnosis not present

## 2023-11-04 DIAGNOSIS — M6259 Muscle wasting and atrophy, not elsewhere classified, multiple sites: Secondary | ICD-10-CM | POA: Diagnosis not present

## 2023-11-04 DIAGNOSIS — R2689 Other abnormalities of gait and mobility: Secondary | ICD-10-CM | POA: Diagnosis not present

## 2023-11-05 DIAGNOSIS — R278 Other lack of coordination: Secondary | ICD-10-CM | POA: Diagnosis not present

## 2023-11-05 DIAGNOSIS — R2689 Other abnormalities of gait and mobility: Secondary | ICD-10-CM | POA: Diagnosis not present

## 2023-11-05 DIAGNOSIS — Z9181 History of falling: Secondary | ICD-10-CM | POA: Diagnosis not present

## 2023-11-05 DIAGNOSIS — M6259 Muscle wasting and atrophy, not elsewhere classified, multiple sites: Secondary | ICD-10-CM | POA: Diagnosis not present

## 2023-11-05 DIAGNOSIS — M6389 Disorders of muscle in diseases classified elsewhere, multiple sites: Secondary | ICD-10-CM | POA: Diagnosis not present

## 2023-11-06 DIAGNOSIS — Z9181 History of falling: Secondary | ICD-10-CM | POA: Diagnosis not present

## 2023-11-06 DIAGNOSIS — M6389 Disorders of muscle in diseases classified elsewhere, multiple sites: Secondary | ICD-10-CM | POA: Diagnosis not present

## 2023-11-06 DIAGNOSIS — R2689 Other abnormalities of gait and mobility: Secondary | ICD-10-CM | POA: Diagnosis not present

## 2023-11-06 DIAGNOSIS — M6259 Muscle wasting and atrophy, not elsewhere classified, multiple sites: Secondary | ICD-10-CM | POA: Diagnosis not present

## 2023-11-06 DIAGNOSIS — R278 Other lack of coordination: Secondary | ICD-10-CM | POA: Diagnosis not present

## 2023-11-09 DIAGNOSIS — R278 Other lack of coordination: Secondary | ICD-10-CM | POA: Diagnosis not present

## 2023-11-09 DIAGNOSIS — R2689 Other abnormalities of gait and mobility: Secondary | ICD-10-CM | POA: Diagnosis not present

## 2023-11-09 DIAGNOSIS — Z9181 History of falling: Secondary | ICD-10-CM | POA: Diagnosis not present

## 2023-11-09 DIAGNOSIS — M6259 Muscle wasting and atrophy, not elsewhere classified, multiple sites: Secondary | ICD-10-CM | POA: Diagnosis not present

## 2023-11-09 DIAGNOSIS — M6389 Disorders of muscle in diseases classified elsewhere, multiple sites: Secondary | ICD-10-CM | POA: Diagnosis not present

## 2023-11-10 DIAGNOSIS — M6389 Disorders of muscle in diseases classified elsewhere, multiple sites: Secondary | ICD-10-CM | POA: Diagnosis not present

## 2023-11-10 DIAGNOSIS — R2689 Other abnormalities of gait and mobility: Secondary | ICD-10-CM | POA: Diagnosis not present

## 2023-11-10 DIAGNOSIS — Z9181 History of falling: Secondary | ICD-10-CM | POA: Diagnosis not present

## 2023-11-10 DIAGNOSIS — M6259 Muscle wasting and atrophy, not elsewhere classified, multiple sites: Secondary | ICD-10-CM | POA: Diagnosis not present

## 2023-11-10 DIAGNOSIS — R278 Other lack of coordination: Secondary | ICD-10-CM | POA: Diagnosis not present

## 2023-11-11 DIAGNOSIS — R278 Other lack of coordination: Secondary | ICD-10-CM | POA: Diagnosis not present

## 2023-11-11 DIAGNOSIS — M6389 Disorders of muscle in diseases classified elsewhere, multiple sites: Secondary | ICD-10-CM | POA: Diagnosis not present

## 2023-11-11 DIAGNOSIS — M170 Bilateral primary osteoarthritis of knee: Secondary | ICD-10-CM | POA: Diagnosis not present

## 2023-11-11 DIAGNOSIS — Z9181 History of falling: Secondary | ICD-10-CM | POA: Diagnosis not present

## 2023-11-12 DIAGNOSIS — R278 Other lack of coordination: Secondary | ICD-10-CM | POA: Diagnosis not present

## 2023-11-12 DIAGNOSIS — Z9181 History of falling: Secondary | ICD-10-CM | POA: Diagnosis not present

## 2023-11-12 DIAGNOSIS — M6389 Disorders of muscle in diseases classified elsewhere, multiple sites: Secondary | ICD-10-CM | POA: Diagnosis not present

## 2023-11-12 DIAGNOSIS — R2689 Other abnormalities of gait and mobility: Secondary | ICD-10-CM | POA: Diagnosis not present

## 2023-11-12 DIAGNOSIS — M6259 Muscle wasting and atrophy, not elsewhere classified, multiple sites: Secondary | ICD-10-CM | POA: Diagnosis not present

## 2023-11-13 DIAGNOSIS — Z9181 History of falling: Secondary | ICD-10-CM | POA: Diagnosis not present

## 2023-11-13 DIAGNOSIS — M6259 Muscle wasting and atrophy, not elsewhere classified, multiple sites: Secondary | ICD-10-CM | POA: Diagnosis not present

## 2023-11-13 DIAGNOSIS — R278 Other lack of coordination: Secondary | ICD-10-CM | POA: Diagnosis not present

## 2023-11-13 DIAGNOSIS — M6389 Disorders of muscle in diseases classified elsewhere, multiple sites: Secondary | ICD-10-CM | POA: Diagnosis not present

## 2023-11-13 DIAGNOSIS — R2689 Other abnormalities of gait and mobility: Secondary | ICD-10-CM | POA: Diagnosis not present

## 2023-11-16 DIAGNOSIS — M6389 Disorders of muscle in diseases classified elsewhere, multiple sites: Secondary | ICD-10-CM | POA: Diagnosis not present

## 2023-11-16 DIAGNOSIS — R2689 Other abnormalities of gait and mobility: Secondary | ICD-10-CM | POA: Diagnosis not present

## 2023-11-16 DIAGNOSIS — Z9181 History of falling: Secondary | ICD-10-CM | POA: Diagnosis not present

## 2023-11-16 DIAGNOSIS — R278 Other lack of coordination: Secondary | ICD-10-CM | POA: Diagnosis not present

## 2023-11-16 DIAGNOSIS — M6259 Muscle wasting and atrophy, not elsewhere classified, multiple sites: Secondary | ICD-10-CM | POA: Diagnosis not present

## 2023-11-16 DIAGNOSIS — M170 Bilateral primary osteoarthritis of knee: Secondary | ICD-10-CM | POA: Diagnosis not present

## 2023-11-17 DIAGNOSIS — M6389 Disorders of muscle in diseases classified elsewhere, multiple sites: Secondary | ICD-10-CM | POA: Diagnosis not present

## 2023-11-17 DIAGNOSIS — R2689 Other abnormalities of gait and mobility: Secondary | ICD-10-CM | POA: Diagnosis not present

## 2023-11-17 DIAGNOSIS — M6259 Muscle wasting and atrophy, not elsewhere classified, multiple sites: Secondary | ICD-10-CM | POA: Diagnosis not present

## 2023-11-17 DIAGNOSIS — R278 Other lack of coordination: Secondary | ICD-10-CM | POA: Diagnosis not present

## 2023-11-17 DIAGNOSIS — Z9181 History of falling: Secondary | ICD-10-CM | POA: Diagnosis not present

## 2023-11-18 DIAGNOSIS — Z9181 History of falling: Secondary | ICD-10-CM | POA: Diagnosis not present

## 2023-11-18 DIAGNOSIS — M6259 Muscle wasting and atrophy, not elsewhere classified, multiple sites: Secondary | ICD-10-CM | POA: Diagnosis not present

## 2023-11-18 DIAGNOSIS — R2689 Other abnormalities of gait and mobility: Secondary | ICD-10-CM | POA: Diagnosis not present

## 2023-11-18 DIAGNOSIS — R278 Other lack of coordination: Secondary | ICD-10-CM | POA: Diagnosis not present

## 2023-11-18 DIAGNOSIS — M6389 Disorders of muscle in diseases classified elsewhere, multiple sites: Secondary | ICD-10-CM | POA: Diagnosis not present

## 2023-11-19 DIAGNOSIS — R2689 Other abnormalities of gait and mobility: Secondary | ICD-10-CM | POA: Diagnosis not present

## 2023-11-19 DIAGNOSIS — M6389 Disorders of muscle in diseases classified elsewhere, multiple sites: Secondary | ICD-10-CM | POA: Diagnosis not present

## 2023-11-19 DIAGNOSIS — M6259 Muscle wasting and atrophy, not elsewhere classified, multiple sites: Secondary | ICD-10-CM | POA: Diagnosis not present

## 2023-11-19 DIAGNOSIS — R278 Other lack of coordination: Secondary | ICD-10-CM | POA: Diagnosis not present

## 2023-11-19 DIAGNOSIS — Z9181 History of falling: Secondary | ICD-10-CM | POA: Diagnosis not present

## 2023-11-20 DIAGNOSIS — R2689 Other abnormalities of gait and mobility: Secondary | ICD-10-CM | POA: Diagnosis not present

## 2023-11-20 DIAGNOSIS — M6259 Muscle wasting and atrophy, not elsewhere classified, multiple sites: Secondary | ICD-10-CM | POA: Diagnosis not present

## 2023-11-20 DIAGNOSIS — R278 Other lack of coordination: Secondary | ICD-10-CM | POA: Diagnosis not present

## 2023-11-20 DIAGNOSIS — Z9181 History of falling: Secondary | ICD-10-CM | POA: Diagnosis not present

## 2023-11-20 DIAGNOSIS — M6389 Disorders of muscle in diseases classified elsewhere, multiple sites: Secondary | ICD-10-CM | POA: Diagnosis not present

## 2023-11-23 DIAGNOSIS — R2689 Other abnormalities of gait and mobility: Secondary | ICD-10-CM | POA: Diagnosis not present

## 2023-11-23 DIAGNOSIS — R278 Other lack of coordination: Secondary | ICD-10-CM | POA: Diagnosis not present

## 2023-11-23 DIAGNOSIS — Z9181 History of falling: Secondary | ICD-10-CM | POA: Diagnosis not present

## 2023-11-23 DIAGNOSIS — M6259 Muscle wasting and atrophy, not elsewhere classified, multiple sites: Secondary | ICD-10-CM | POA: Diagnosis not present

## 2023-11-23 DIAGNOSIS — M6389 Disorders of muscle in diseases classified elsewhere, multiple sites: Secondary | ICD-10-CM | POA: Diagnosis not present

## 2023-11-24 DIAGNOSIS — Z9181 History of falling: Secondary | ICD-10-CM | POA: Diagnosis not present

## 2023-11-24 DIAGNOSIS — M170 Bilateral primary osteoarthritis of knee: Secondary | ICD-10-CM | POA: Diagnosis not present

## 2023-11-24 DIAGNOSIS — R278 Other lack of coordination: Secondary | ICD-10-CM | POA: Diagnosis not present

## 2023-11-24 DIAGNOSIS — M6389 Disorders of muscle in diseases classified elsewhere, multiple sites: Secondary | ICD-10-CM | POA: Diagnosis not present

## 2023-11-27 DIAGNOSIS — M6259 Muscle wasting and atrophy, not elsewhere classified, multiple sites: Secondary | ICD-10-CM | POA: Diagnosis not present

## 2023-11-27 DIAGNOSIS — R2689 Other abnormalities of gait and mobility: Secondary | ICD-10-CM | POA: Diagnosis not present

## 2023-11-27 DIAGNOSIS — Z9181 History of falling: Secondary | ICD-10-CM | POA: Diagnosis not present

## 2023-11-28 DIAGNOSIS — M6259 Muscle wasting and atrophy, not elsewhere classified, multiple sites: Secondary | ICD-10-CM | POA: Diagnosis not present

## 2023-11-28 DIAGNOSIS — Z9181 History of falling: Secondary | ICD-10-CM | POA: Diagnosis not present

## 2023-11-28 DIAGNOSIS — R2689 Other abnormalities of gait and mobility: Secondary | ICD-10-CM | POA: Diagnosis not present

## 2023-11-30 DIAGNOSIS — R278 Other lack of coordination: Secondary | ICD-10-CM | POA: Diagnosis not present

## 2023-11-30 DIAGNOSIS — Z9181 History of falling: Secondary | ICD-10-CM | POA: Diagnosis not present

## 2023-12-01 DIAGNOSIS — Z9181 History of falling: Secondary | ICD-10-CM | POA: Diagnosis not present

## 2023-12-01 DIAGNOSIS — M170 Bilateral primary osteoarthritis of knee: Secondary | ICD-10-CM | POA: Diagnosis not present

## 2023-12-01 DIAGNOSIS — R2689 Other abnormalities of gait and mobility: Secondary | ICD-10-CM | POA: Diagnosis not present

## 2023-12-01 DIAGNOSIS — R278 Other lack of coordination: Secondary | ICD-10-CM | POA: Diagnosis not present

## 2023-12-01 DIAGNOSIS — M6259 Muscle wasting and atrophy, not elsewhere classified, multiple sites: Secondary | ICD-10-CM | POA: Diagnosis not present

## 2023-12-02 DIAGNOSIS — R278 Other lack of coordination: Secondary | ICD-10-CM | POA: Diagnosis not present

## 2023-12-02 DIAGNOSIS — Z9181 History of falling: Secondary | ICD-10-CM | POA: Diagnosis not present

## 2023-12-02 DIAGNOSIS — R2689 Other abnormalities of gait and mobility: Secondary | ICD-10-CM | POA: Diagnosis not present

## 2023-12-02 DIAGNOSIS — M6259 Muscle wasting and atrophy, not elsewhere classified, multiple sites: Secondary | ICD-10-CM | POA: Diagnosis not present

## 2023-12-03 DIAGNOSIS — M6259 Muscle wasting and atrophy, not elsewhere classified, multiple sites: Secondary | ICD-10-CM | POA: Diagnosis not present

## 2023-12-03 DIAGNOSIS — Z9181 History of falling: Secondary | ICD-10-CM | POA: Diagnosis not present

## 2023-12-03 DIAGNOSIS — R278 Other lack of coordination: Secondary | ICD-10-CM | POA: Diagnosis not present

## 2023-12-03 DIAGNOSIS — R2689 Other abnormalities of gait and mobility: Secondary | ICD-10-CM | POA: Diagnosis not present

## 2023-12-04 DIAGNOSIS — M6259 Muscle wasting and atrophy, not elsewhere classified, multiple sites: Secondary | ICD-10-CM | POA: Diagnosis not present

## 2023-12-04 DIAGNOSIS — R278 Other lack of coordination: Secondary | ICD-10-CM | POA: Diagnosis not present

## 2023-12-04 DIAGNOSIS — R2689 Other abnormalities of gait and mobility: Secondary | ICD-10-CM | POA: Diagnosis not present

## 2023-12-04 DIAGNOSIS — Z9181 History of falling: Secondary | ICD-10-CM | POA: Diagnosis not present

## 2023-12-07 DIAGNOSIS — R278 Other lack of coordination: Secondary | ICD-10-CM | POA: Diagnosis not present

## 2023-12-07 DIAGNOSIS — Z9181 History of falling: Secondary | ICD-10-CM | POA: Diagnosis not present

## 2023-12-08 DIAGNOSIS — Z9181 History of falling: Secondary | ICD-10-CM | POA: Diagnosis not present

## 2023-12-08 DIAGNOSIS — R278 Other lack of coordination: Secondary | ICD-10-CM | POA: Diagnosis not present

## 2023-12-09 DIAGNOSIS — R278 Other lack of coordination: Secondary | ICD-10-CM | POA: Diagnosis not present

## 2023-12-09 DIAGNOSIS — Z9181 History of falling: Secondary | ICD-10-CM | POA: Diagnosis not present

## 2023-12-10 ENCOUNTER — Non-Acute Institutional Stay (SKILLED_NURSING_FACILITY): Payer: Medicare Other | Admitting: Adult Health

## 2023-12-10 ENCOUNTER — Encounter: Payer: Self-pay | Admitting: Adult Health

## 2023-12-10 DIAGNOSIS — I10 Essential (primary) hypertension: Secondary | ICD-10-CM | POA: Diagnosis not present

## 2023-12-10 DIAGNOSIS — E113293 Type 2 diabetes mellitus with mild nonproliferative diabetic retinopathy without macular edema, bilateral: Secondary | ICD-10-CM | POA: Diagnosis not present

## 2023-12-10 DIAGNOSIS — F064 Anxiety disorder due to known physiological condition: Secondary | ICD-10-CM

## 2023-12-10 DIAGNOSIS — I358 Other nonrheumatic aortic valve disorders: Secondary | ICD-10-CM

## 2023-12-10 DIAGNOSIS — E7800 Pure hypercholesterolemia, unspecified: Secondary | ICD-10-CM

## 2023-12-10 DIAGNOSIS — M170 Bilateral primary osteoarthritis of knee: Secondary | ICD-10-CM

## 2023-12-10 DIAGNOSIS — R2689 Other abnormalities of gait and mobility: Secondary | ICD-10-CM | POA: Diagnosis not present

## 2023-12-10 DIAGNOSIS — E669 Obesity, unspecified: Secondary | ICD-10-CM

## 2023-12-10 DIAGNOSIS — M6259 Muscle wasting and atrophy, not elsewhere classified, multiple sites: Secondary | ICD-10-CM | POA: Diagnosis not present

## 2023-12-10 DIAGNOSIS — Z9181 History of falling: Secondary | ICD-10-CM | POA: Diagnosis not present

## 2023-12-10 DIAGNOSIS — R278 Other lack of coordination: Secondary | ICD-10-CM | POA: Diagnosis not present

## 2023-12-10 MED ORDER — ESCITALOPRAM OXALATE 5 MG PO TABS: 5.0000 mg | ORAL_TABLET | Freq: Every day | ORAL | Status: DC

## 2023-12-10 MED ORDER — HYDROXYZINE HCL 10 MG PO TABS
10.0000 mg | ORAL_TABLET | Freq: Every day | ORAL | Status: DC | PRN
Start: 2023-12-10 — End: 2024-07-10

## 2023-12-10 NOTE — Progress Notes (Signed)
Location:  [facility] illustrator of Service:  SNF (31) Provider:   Peggye Ley, ANP [facility] [phone]   Thana Ates, MD  Patient Care Team: Thana Ates, MD as PCP - General (Internal Medicine)  Extended Emergency Contact Information Primary Emergency Contact: trotter,[name] Mobile Phone: [phone] Relation: Daughter  Code Status:  Full Goals of care: Advanced Directive information    10/23/2023   11:59 AM  Advanced Directives  Does Patient Have a Medical Advance Directive? Yes  Type of Advance Directive Living will;Out of facility DNR (pink MOST or yellow form)  Does patient want to make changes to medical advance directive? No - Patient declined  Pre-existing out of facility DNR order (yellow form or pink MOST form) Pink MOST form placed in chart (order not valid for inpatient use)     Chief Complaint  Patient presents with   Medical Management of Chronic Issues    HPI:  Pt is a 89 y.o. female seen today for medical management of chronic diseases.   She lives in [place] but is currently in skilled rehab due to decreased mobility due to bilateral severe knee arthritis. She is using a stand aid lift which causes some anxiety as well. She denies feeling depressed. Reports she has family members that are physicians. She is a widow and her husband was a physician.  The patient presents with concerns related to mobility and anxiety management.  She received cortisone knee injections on November 11, 2023, followed by bilateral gel injections on December 01, 2023. Despite these treatments, there has been no improvement in mobility, and she continues to require a lift for transfers.  She was started on Lexapro 12/27 to address anxiety related to her medical condition and fear of falling. Lexapro has provided slight relief, but she continues to experience a fear of falling, which limits her activities. She is also taking Vistaril to help  manage her anxiety and is concerned about taking additional medications due to potential side effects.  For diabetes management, she is on Glipizide and Ozempic. Her fasting blood sugar this morning was 134 mg/dL, and her daily readings typically range between 118 and 135 mg/dL. She does not have recent A1c or lipid panel results available, and these need to be obtained from Dr. Margaretann Loveless.  Her hypertension is well-controlled, with a recent blood pressure reading of 126/76 mmHg. She is also on Atorvastatin for hyperlipidemia. LABS Na: 141 (11/02/2023) K: 3.9 (11/02/2023) BUN: 17 (11/02/2023) Cr: 0.65 (11/02/2023) WBC: 12.2 (10/27/2023) Hb: 12.5 (10/27/2023) PLT: 230 (10/27/2023) MCV: 94.5 (10/27/2023) Past Medical History:  Diagnosis Date   Diabetes (HCC)    HLD (hyperlipidemia)    HTN (hypertension)    Hypercholesteremia    Low back pain    Obesities, morbid (HCC)    Past Surgical History:  Procedure Laterality Date   APPENDECTOMY     TONSILLECTOMY      Allergies  Allergen Reactions   Arthro-Complex [Nutritional Supplements]    Empagliflozin Other (See Comments)    Other reaction(s): yeast infection   Levaquin [Levofloxacin In D5w]    Levofloxacin Other (See Comments)   Lipitor [Atorvastatin]    Metformin Hcl Other (See Comments)    Other reaction(s): stomach cramps   Penicillin G     Other reaction(s): throat closing up   Penicillins    Terbinafine Other (See Comments)    Other reaction(s): Unknown    Outpatient Encounter Medications as of 12/10/2023  Medication Sig  ACCU-CHEK GUIDE test strip    Accu-Chek Softclix Lancets lancets daily.   acetaminophen (TYLENOL) 500 MG tablet 2 tabs as needed   atorvastatin (LIPITOR) 20 MG tablet Take 20 mg by mouth daily.   Blood Glucose Monitoring Suppl (ACCU-CHEK GUIDE) w/Device KIT    cholecalciferol (VITAMIN D) 1000 UNITS tablet Take 1,000 Units by mouth daily.   diltiazem (CARDIZEM CD) 180 MG 24 hr capsule Take 1 capsule (180  mg total) by mouth daily.   glipiZIDE (GLUCOTROL XL) 2.5 MG 24 hr tablet Take 2.5 mg by mouth every morning.   meloxicam (MOBIC) 7.5 MG tablet Take 7.5 mg by mouth daily as needed for pain.   Multiple Vitamin (MULTIVITAMIN ADULT PO) Take by mouth.  1 tablet; oral Once A Morning;   olmesartan-hydrochlorothiazide (BENICAR HCT) 40-25 MG tablet Take 1 tablet by mouth daily.   OZEMPIC, 0.25 OR 0.5 MG/DOSE, 2 MG/3ML SOPN Inject into the skin.   No facility-administered encounter medications on file as of 12/10/2023.    Review of Systems  Constitutional:  Negative for activity change, appetite change, chills, diaphoresis, fatigue, fever and unexpected weight change.  HENT:  Negative for congestion.   Respiratory:  Negative for cough, shortness of breath and wheezing.   Cardiovascular:  Positive for leg swelling. Negative for chest pain and palpitations.  Gastrointestinal:  Negative for abdominal distention, abdominal pain, constipation and diarrhea.  Genitourinary:  Negative for difficulty urinating and dysuria.  Musculoskeletal:  Positive for arthralgias and gait problem. Negative for back pain, joint swelling and myalgias.  Neurological:  Negative for dizziness, tremors, seizures, syncope, facial asymmetry, speech difficulty, weakness, light-headedness, numbness and headaches.  Psychiatric/Behavioral:  Negative for agitation, behavioral problems and confusion.     Immunization History  Administered Date(s) Administered   Fluad Quad(high Dose 65+) 07/28/2022   Influenza Split 07/26/2009, 07/31/2010, 08/13/2011, 09/02/2012, 09/20/2013, 08/04/2019, 08/24/2020   Influenza,inj,Quad PF,6+ Mos 07/29/2017   Moderna Sars-Covid-2 Vaccination 11/22/2019, 12/06/2019, 08/31/2020   PNEUMOCOCCAL CONJUGATE-20 11/24/2022   Pfizer(Comirnaty)Fall Seasonal Vaccine 12 years and older 08/12/2022   Pneumococcal Conjugate-13 10/02/2014   Pneumococcal Polysaccharide-23 03/21/2009, 04/22/2017   Td 03/21/2009   Tdap  08/18/2019   Zoster, Live 03/15/2013, 09/13/2019, 03/06/2020   Pertinent  Health Maintenance Due  Topic Date Due   HEMOGLOBIN A1C  Never done   OPHTHALMOLOGY EXAM  Never done   DEXA SCAN  Never done   INFLUENZA VACCINE  05/28/2023   FOOT EXAM  08/25/2024      10/19/2023   11:34 AM  Fall Risk  Falls in the past year? 0  Was there an injury with Fall? 0  Fall Risk Category Calculator 0  Patient at Risk for Falls Due to No Fall Risks  Fall risk Follow up Falls evaluation completed   Functional Status Survey:    Vitals:   12/10/23 1047  BP: 126/76  Pulse: 73  Resp: 16  Temp: 98.3 F (36.8 C)  SpO2: 95%   There is no height or weight on file to calculate BMI. Physical Exam Vitals and nursing note reviewed.  Constitutional:      General: She is not in acute distress.    Appearance: She is not diaphoretic.  HENT:     Head: Normocephalic and atraumatic.  Neck:     Vascular: No JVD.  Cardiovascular:     Rate and Rhythm: Normal rate and regular rhythm.     Heart sounds: Murmur heard.  Pulmonary:     Effort: Pulmonary effort is normal. No respiratory  distress.     Breath sounds: Normal breath sounds. No wheezing.  Musculoskeletal:        General: No swelling, tenderness, deformity or signs of injury.     Comments: BLE edema  Skin:    General: Skin is warm and dry.  Neurological:     Mental Status: She is alert and oriented to person, place, and time.  Psychiatric:        Mood and Affect: Mood normal.     Labs reviewed: No results for input(s): "NA", "K", "CL", "CO2", "GLUCOSE", "BUN", "CREATININE", "CALCIUM", "MG", "PHOS" in the last 8760 hours. No results for input(s): "AST", "ALT", "ALKPHOS", "BILITOT", "PROT", "ALBUMIN" in the last 8760 hours. No results for input(s): "WBC", "NEUTROABS", "HGB", "HCT", "MCV", "PLT" in the last 8760 hours. No results found for: "TSH" No results found for: "HGBA1C" No results found for: "CHOL", "HDL", "LDLCALC", "LDLDIRECT",  "TRIG", "CHOLHDL"  Significant Diagnostic Results in last 30 days:  No results found.  Assessment/Plan  Osteoarthritis of the knees No improvement in mobility despite cortisone and gel injections. -Continue current management and monitor for changes. -continue therapy.  -may need skilled care if she is not able to progress off a lift for transfers  Anxiety Initiated on Lexapro and Vistaril due to anxiety related to medical condition and fear of falling. Some improvement noted but fear of falling persists. Patient hesitant to increase medication due to side effect concerns. -Continue current regimen and monitor for changes.  Type 2 Diabetes Mellitus On Glipizide and Ozempic. Fasting blood sugars between 118-135. -Continue current regimen. -Obtain recent A1C from Dr. Margaretann Loveless.  Hypertension Well controlled. -Continue current management.  Hyperlipidemia On Atorvastatin. -Continue Atorvastatin. -Obtain recent lipid panel from Dr. Margaretann Loveless.  Obesity BMI 34, above goal. -on ozempic  Aortic sclerosis Murmur on exam Prior echo 2016 indicated EF 60-65% grade 2 diastolic dysfunction aortic sclerosis without stenosis.  No current symptoms.   Family/ staff Communication: resident and nurse  Labs/tests ordered:  needs A1C and lipid panel if not done at PCP, discussed with the nurse to obtain records.    Also recommend flu vaccine if not done

## 2023-12-11 DIAGNOSIS — Z9181 History of falling: Secondary | ICD-10-CM | POA: Diagnosis not present

## 2023-12-11 DIAGNOSIS — E1142 Type 2 diabetes mellitus with diabetic polyneuropathy: Secondary | ICD-10-CM | POA: Diagnosis not present

## 2023-12-11 DIAGNOSIS — M6259 Muscle wasting and atrophy, not elsewhere classified, multiple sites: Secondary | ICD-10-CM | POA: Diagnosis not present

## 2023-12-11 DIAGNOSIS — R2689 Other abnormalities of gait and mobility: Secondary | ICD-10-CM | POA: Diagnosis not present

## 2023-12-11 DIAGNOSIS — R278 Other lack of coordination: Secondary | ICD-10-CM | POA: Diagnosis not present

## 2023-12-11 LAB — LIPID PANEL
Cholesterol: 157 (ref 0–200)
HDL: 72 — AB (ref 35–70)
LDL Cholesterol: 72
LDl/HDL Ratio: 2.2
Triglycerides: 66 (ref 40–160)

## 2023-12-11 LAB — HEMOGLOBIN A1C: Hemoglobin A1C: 7.3

## 2023-12-14 ENCOUNTER — Non-Acute Institutional Stay (SKILLED_NURSING_FACILITY): Payer: Medicare Other | Admitting: Internal Medicine

## 2023-12-14 ENCOUNTER — Encounter: Payer: Self-pay | Admitting: Internal Medicine

## 2023-12-14 DIAGNOSIS — I358 Other nonrheumatic aortic valve disorders: Secondary | ICD-10-CM | POA: Diagnosis not present

## 2023-12-14 DIAGNOSIS — R600 Localized edema: Secondary | ICD-10-CM

## 2023-12-14 DIAGNOSIS — E113293 Type 2 diabetes mellitus with mild nonproliferative diabetic retinopathy without macular edema, bilateral: Secondary | ICD-10-CM | POA: Diagnosis not present

## 2023-12-14 DIAGNOSIS — R278 Other lack of coordination: Secondary | ICD-10-CM | POA: Diagnosis not present

## 2023-12-14 DIAGNOSIS — I10 Essential (primary) hypertension: Secondary | ICD-10-CM | POA: Diagnosis not present

## 2023-12-14 DIAGNOSIS — M6259 Muscle wasting and atrophy, not elsewhere classified, multiple sites: Secondary | ICD-10-CM | POA: Diagnosis not present

## 2023-12-14 DIAGNOSIS — E7800 Pure hypercholesterolemia, unspecified: Secondary | ICD-10-CM

## 2023-12-14 DIAGNOSIS — R2689 Other abnormalities of gait and mobility: Secondary | ICD-10-CM | POA: Diagnosis not present

## 2023-12-14 DIAGNOSIS — Z9181 History of falling: Secondary | ICD-10-CM | POA: Diagnosis not present

## 2023-12-14 DIAGNOSIS — M170 Bilateral primary osteoarthritis of knee: Secondary | ICD-10-CM | POA: Diagnosis not present

## 2023-12-14 DIAGNOSIS — F064 Anxiety disorder due to known physiological condition: Secondary | ICD-10-CM

## 2023-12-14 NOTE — Progress Notes (Signed)
Location: [facility]:  SNF (31)  Provider:   Code Status: Full Code Goals of Care:     10/23/2023   11:59 AM  Advanced Directives  Does Patient Have a Medical Advance Directive? Yes  Type of Advance Directive Living will;Out of facility DNR (pink MOST or yellow form)  Does patient want to make changes to medical advance directive? No - Patient declined  Pre-existing out of facility DNR order (yellow form or pink MOST form) Pink MOST form placed in chart (order not valid for inpatient use)     Chief Complaint  Patient presents with   Acute Visit    HPI: Patient is a 89 y.o. female seen today for an acute visit for LE edema  Admitted in Rehab as she is unable to walk and do her transfers due to Knee Arthritis  Patient has a history of hypertension, HLD, type 2 diabetes with neuropathy   Patient also has a history of bilateral knee arthritis.  Seen today for LE edema Both legs Right more then left No SOB or Cough  Wt Readings from Last 3 Encounters:  12/14/23 205 lb (93 kg)  10/23/23 198 lb 6.4 oz (90 kg)  10/19/23 200 lb (90.7 kg)    Not able to do her transfers Uses Lifts for transfers No Pain in the leg Getting trained with Power chair per OT  Past Medical History:  Diagnosis Date   Diabetes (HCC)    HLD (hyperlipidemia)    HTN (hypertension)    Hypercholesteremia    Low back pain    Obesities, morbid (HCC)     Past Surgical History:  Procedure Laterality Date   APPENDECTOMY     TONSILLECTOMY      Allergies  Allergen Reactions   Arthro-Complex [Nutritional Supplements]    Empagliflozin Other (See Comments)    Other reaction(s): yeast infection   Levaquin [Levofloxacin In D5w]    Levofloxacin Other (See Comments)   Lipitor [Atorvastatin]    Metformin Hcl Other (See Comments)    Other reaction(s): stomach cramps   Penicillin G     Other reaction(s): throat closing up   Penicillins    Terbinafine Other  (See Comments)    Other reaction(s): Unknown    Outpatient Encounter Medications as of 12/14/2023  Medication Sig   ACCU-CHEK GUIDE test strip    Accu-Chek Softclix Lancets lancets daily.   acetaminophen (TYLENOL) 500 MG tablet 2 tabs as needed   atorvastatin (LIPITOR) 20 MG tablet Take 20 mg by mouth daily.   Blood Glucose Monitoring Suppl (ACCU-CHEK GUIDE) w/Device KIT    cholecalciferol (VITAMIN D) 1000 UNITS tablet Take 1,000 Units by mouth daily.   diltiazem (CARDIZEM CD) 180 MG 24 hr capsule Take 1 capsule (180 mg total) by mouth daily.   escitalopram (LEXAPRO) 5 MG tablet Take 1 tablet (5 mg total) by mouth daily.   glipiZIDE (GLUCOTROL XL) 2.5 MG 24 hr tablet Take 2.5 mg by mouth every morning.   hydrOXYzine (ATARAX) 10 MG tablet Take 1 tablet (10 mg total) by mouth daily as needed.   meloxicam (MOBIC) 7.5 MG tablet Take 7.5 mg by mouth daily as needed for pain.   Multiple Vitamin (MULTIVITAMIN ADULT PO) Take by mouth.  1 tablet; oral Once A Morning;   olmesartan-hydrochlorothiazide (BENICAR HCT) 40-25 MG tablet Take 1 tablet by mouth daily.   OZEMPIC, 0.25 OR 0.5 MG/DOSE, 2 MG/3ML SOPN Inject into the skin.   No facility-administered  encounter medications on file as of 12/14/2023.    Review of Systems:  Review of Systems  Constitutional:  Negative for activity change and appetite change.  HENT: Negative.    Respiratory:  Negative for cough and shortness of breath.   Cardiovascular:  Positive for leg swelling.  Gastrointestinal:  Negative for constipation.  Genitourinary: Negative.   Musculoskeletal:  Positive for arthralgias and gait problem. Negative for myalgias.  Skin: Negative.   Neurological:  Negative for dizziness and weakness.  Psychiatric/Behavioral:  Negative for confusion, dysphoric mood and sleep disturbance.     Health Maintenance  Topic Date Due   HEMOGLOBIN A1C  Never done   OPHTHALMOLOGY EXAM  Never done   DEXA SCAN  Never done   INFLUENZA VACCINE   05/28/2023   COVID-19 Vaccine (5 - 2024-25 season) 06/28/2023   Zoster Vaccines- Shingrix (1 of 2) 01/21/2024 (Originally 05/23/1953)   FOOT EXAM  08/25/2024   Medicare Annual Wellness (AWV)  11/26/2024   DTaP/Tdap/Td (3 - Td or Tdap) 08/17/2029   Pneumonia Vaccine 7+ Years old  Completed   HPV VACCINES  Aged Out    Physical Exam: Vitals:   12/14/23 1934  BP: 117/64  Pulse: 70  Resp: 12  Temp: 98.3 F (36.8 C)  Weight: 205 lb (93 kg)   Body mass index is 34.11 kg/m. Physical Exam Vitals reviewed.  Constitutional:      Appearance: Normal appearance.  HENT:     Head: Normocephalic.     Nose: Nose normal.     Mouth/Throat:     Mouth: Mucous membranes are moist.     Pharynx: Oropharynx is clear.  Eyes:     Pupils: Pupils are equal, round, and reactive to light.  Cardiovascular:     Rate and Rhythm: Normal rate and regular rhythm.     Pulses: Normal pulses.     Heart sounds: Murmur heard.  Pulmonary:     Effort: Pulmonary effort is normal.     Breath sounds: Normal breath sounds.  Abdominal:     General: Abdomen is flat. Bowel sounds are normal.     Palpations: Abdomen is soft.  Musculoskeletal:        General: Swelling present.     Cervical back: Neck supple.  Skin:    General: Skin is warm.  Neurological:     General: No focal deficit present.     Mental Status: She is alert and oriented to person, place, and time.  Psychiatric:        Mood and Affect: Mood normal.        Thought Content: Thought content normal.     Labs reviewed: Basic Metabolic Panel: No results for input(s): "NA", "K", "CL", "CO2", "GLUCOSE", "BUN", "CREATININE", "CALCIUM", "MG", "PHOS", "TSH" in the last 8760 hours. Liver Function Tests: No results for input(s): "AST", "ALT", "ALKPHOS", "BILITOT", "PROT", "ALBUMIN" in the last 8760 hours. No results for input(s): "LIPASE", "AMYLASE" in the last 8760 hours. No results for input(s): "AMMONIA" in the last 8760 hours. CBC: No results for  input(s): "WBC", "NEUTROABS", "HGB", "HCT", "MCV", "PLT" in the last 8760 hours. Lipid Panel: No results for input(s): "CHOL", "HDL", "LDLCALC", "TRIG", "CHOLHDL", "LDLDIRECT" in the last 8760 hours. No results found for: "HGBA1C"  Procedures since last visit: No results found.  Assessment/Plan 1. Bilateral leg edema (Primary) She was on Lasix but had stopped it Will restart her on Lasix 40 mg every day with 20 meq of Potassium BMP and BNP in Am Also  Ordered Echo for Heart Eval due to her Murmur  2. Aortic valve sclerosis Ordered Echo    3. Essential hypertension BP controlled on Cardizem and Benicar  4. Primary osteoarthritis of both knees Now using Power chair Not able to do her transfers Plan to move to SNF  5. Type 2 diabetes mellitus with both eyes affected by mild nonproliferative retinopathy without macular edema, without long-term current use of insulin (HCC) A1C was 7.3 CBGS running 145-180 No Change in Ozempic and Glipizide  6. Hypercholesterolemia LDL 72 on statin  7. Anxiety disorder due to medical condition Lexapro and Vistaril PRN    Labs/tests ordered:  BP and BNP Next appt:  Visit date not found

## 2023-12-14 NOTE — Progress Notes (Signed)
 A user error has taken place.

## 2023-12-15 ENCOUNTER — Ambulatory Visit: Payer: Medicare Other | Admitting: Podiatry

## 2023-12-15 DIAGNOSIS — Z9181 History of falling: Secondary | ICD-10-CM | POA: Diagnosis not present

## 2023-12-15 DIAGNOSIS — R2689 Other abnormalities of gait and mobility: Secondary | ICD-10-CM | POA: Diagnosis not present

## 2023-12-15 DIAGNOSIS — I110 Hypertensive heart disease with heart failure: Secondary | ICD-10-CM | POA: Diagnosis not present

## 2023-12-15 DIAGNOSIS — M6259 Muscle wasting and atrophy, not elsewhere classified, multiple sites: Secondary | ICD-10-CM | POA: Diagnosis not present

## 2023-12-15 DIAGNOSIS — R278 Other lack of coordination: Secondary | ICD-10-CM | POA: Diagnosis not present

## 2023-12-16 DIAGNOSIS — R2689 Other abnormalities of gait and mobility: Secondary | ICD-10-CM | POA: Diagnosis not present

## 2023-12-16 DIAGNOSIS — M6259 Muscle wasting and atrophy, not elsewhere classified, multiple sites: Secondary | ICD-10-CM | POA: Diagnosis not present

## 2023-12-16 DIAGNOSIS — Z9181 History of falling: Secondary | ICD-10-CM | POA: Diagnosis not present

## 2023-12-17 DIAGNOSIS — M6259 Muscle wasting and atrophy, not elsewhere classified, multiple sites: Secondary | ICD-10-CM | POA: Diagnosis not present

## 2023-12-17 DIAGNOSIS — R2689 Other abnormalities of gait and mobility: Secondary | ICD-10-CM | POA: Diagnosis not present

## 2023-12-17 DIAGNOSIS — Z9181 History of falling: Secondary | ICD-10-CM | POA: Diagnosis not present

## 2023-12-21 DIAGNOSIS — E1142 Type 2 diabetes mellitus with diabetic polyneuropathy: Secondary | ICD-10-CM | POA: Diagnosis not present

## 2023-12-21 LAB — BASIC METABOLIC PANEL
BUN: 26 — AB (ref 4–21)
CO2: 29 — AB (ref 13–22)
Chloride: 103 (ref 99–108)
Creatinine: 0.7 (ref 0.5–1.1)
Glucose: 150
Potassium: 3.4 meq/L — AB (ref 3.5–5.1)
Sodium: 142 (ref 137–147)

## 2023-12-21 LAB — COMPREHENSIVE METABOLIC PANEL
Calcium: 8.9 (ref 8.7–10.7)
eGFR: 85

## 2023-12-22 DIAGNOSIS — Z9181 History of falling: Secondary | ICD-10-CM | POA: Diagnosis not present

## 2023-12-22 DIAGNOSIS — R278 Other lack of coordination: Secondary | ICD-10-CM | POA: Diagnosis not present

## 2023-12-23 DIAGNOSIS — R2689 Other abnormalities of gait and mobility: Secondary | ICD-10-CM | POA: Diagnosis not present

## 2023-12-23 DIAGNOSIS — M6259 Muscle wasting and atrophy, not elsewhere classified, multiple sites: Secondary | ICD-10-CM | POA: Diagnosis not present

## 2023-12-23 DIAGNOSIS — Z9181 History of falling: Secondary | ICD-10-CM | POA: Diagnosis not present

## 2023-12-25 DIAGNOSIS — Z9181 History of falling: Secondary | ICD-10-CM | POA: Diagnosis not present

## 2023-12-25 DIAGNOSIS — M6259 Muscle wasting and atrophy, not elsewhere classified, multiple sites: Secondary | ICD-10-CM | POA: Diagnosis not present

## 2023-12-25 DIAGNOSIS — R2689 Other abnormalities of gait and mobility: Secondary | ICD-10-CM | POA: Diagnosis not present

## 2023-12-28 ENCOUNTER — Encounter: Payer: Self-pay | Admitting: Internal Medicine

## 2023-12-28 ENCOUNTER — Non-Acute Institutional Stay (SKILLED_NURSING_FACILITY): Payer: Self-pay | Admitting: Internal Medicine

## 2023-12-28 DIAGNOSIS — I10 Essential (primary) hypertension: Secondary | ICD-10-CM

## 2023-12-28 DIAGNOSIS — M6259 Muscle wasting and atrophy, not elsewhere classified, multiple sites: Secondary | ICD-10-CM | POA: Diagnosis not present

## 2023-12-28 DIAGNOSIS — R600 Localized edema: Secondary | ICD-10-CM | POA: Diagnosis not present

## 2023-12-28 DIAGNOSIS — M170 Bilateral primary osteoarthritis of knee: Secondary | ICD-10-CM | POA: Diagnosis not present

## 2023-12-28 DIAGNOSIS — R2689 Other abnormalities of gait and mobility: Secondary | ICD-10-CM | POA: Diagnosis not present

## 2023-12-28 DIAGNOSIS — Z9181 History of falling: Secondary | ICD-10-CM | POA: Diagnosis not present

## 2023-12-28 NOTE — Progress Notes (Signed)
 Location:  [facility] Room Number: 160 A Place of Service:  SNF [phone]) Provider:  Mahlon Gammon, MD   Mahlon Gammon, MD  Patient Care Team: Mahlon Gammon, MD as PCP - General (Internal Medicine)  Extended Emergency Contact Information Primary Emergency Contact: trotter,[name] Mobile Phone: [phone] Relation: Daughter  Code Status:  Full Code Goals of care: Advanced Directive information    12/28/2023   12:43 PM  Advanced Directives  Does Patient Have a Medical Advance Directive? Yes  Type of Advance Directive Living will  Does patient want to make changes to medical advance directive? No - Patient declined     Chief Complaint  Patient presents with   Edema    Leg edema. Discuss the need for eye exam and DEXA scan.    HPI:  Pt is a 89 y.o. female seen today for an acute visit for having For Increased Urinary Frequency  Patient is in Rehab in [place] Patient has a history of hypertension, HLD, type 2 diabetes with neuropathy   Patient also has a history of bilateral knee arthritis  Was seen Last week for LE edema Was started on Lasix Now c/o Having Urinary Incontinence and Frequency Wt Readings from Last 3 Encounters:  12/28/23 202 lb 12.8 oz (92 kg)  12/14/23 205 lb (93 kg)  10/23/23 198 lb 6.4 oz (90 kg)    Has lost some weight Edema is better But this is effecting her Quality She will eventual going to go to SNF as she [facility] for transfers due to weakness and arthritis in her Knees No SOB Echo done here is Normal with Normal EF PRO BNP was 251    Past Medical History:  Diagnosis Date   Diabetes (HCC)    HLD (hyperlipidemia)    HTN (hypertension)    Hypercholesteremia    Low back pain    Obesities, morbid (HCC)    Past Surgical History:  Procedure Laterality Date   APPENDECTOMY     TONSILLECTOMY      Allergies  Allergen Reactions   Arthro-Complex [Nutritional Supplements]    Empagliflozin Other (See  Comments)    Other reaction(s): yeast infection   Levaquin [Levofloxacin In D5w]    Levofloxacin Other (See Comments)   Lipitor [Atorvastatin]    Metformin Hcl Other (See Comments)    Other reaction(s): stomach cramps   Penicillin G     Other reaction(s): throat closing up   Penicillins    Terbinafine Other (See Comments)    Other reaction(s): Unknown    Outpatient Encounter Medications as of 12/28/2023  Medication Sig   acetaminophen (TYLENOL) 500 MG tablet 2 tabs as needed   atorvastatin (LIPITOR) 20 MG tablet Take 20 mg by mouth daily.   cholecalciferol (VITAMIN D) 1000 UNITS tablet Take 1,000 Units by mouth daily.   diltiazem (CARDIZEM CD) 180 MG 24 hr capsule Take 1 capsule (180 mg total) by mouth daily.   escitalopram (LEXAPRO) 10 MG tablet Take 10 mg by mouth daily.   furosemide (LASIX) 40 MG tablet Take 40 mg by mouth.   glipiZIDE (GLUCOTROL XL) 2.5 MG 24 hr tablet Take 2.5 mg by mouth every morning.   hydrOXYzine (ATARAX) 10 MG tablet Take 1 tablet (10 mg total) by mouth daily as needed.   meloxicam (MOBIC) 7.5 MG tablet Take 7.5 mg by mouth daily as needed for pain.   Multiple Vitamin (MULTIVITAMIN ADULT PO) Take by mouth.  1 tablet; oral Once A Morning;  olmesartan-hydrochlorothiazide (BENICAR HCT) 40-25 MG tablet Take 1 tablet by mouth daily.   OZEMPIC, 0.25 OR 0.5 MG/DOSE, 2 MG/3ML SOPN Inject 0.5 mg into the skin once a week.   potassium chloride SA (KLOR-CON M) 20 MEQ tablet Take 20 mEq by mouth once.   ACCU-CHEK GUIDE test strip  (Patient not taking: Reported on 12/28/2023)   Accu-Chek Softclix Lancets lancets daily. (Patient not taking: Reported on 12/28/2023)   Blood Glucose Monitoring Suppl (ACCU-CHEK GUIDE) w/Device KIT  (Patient not taking: Reported on 12/28/2023)   No facility-administered encounter medications on file as of 12/28/2023.    Review of Systems  Constitutional:  Negative for activity change and appetite change.  HENT: Negative.    Respiratory:   Negative for cough and shortness of breath.   Cardiovascular:  Negative for leg swelling.  Gastrointestinal:  Negative for constipation.  Genitourinary:  Positive for frequency.  Musculoskeletal:  Positive for arthralgias, gait problem and myalgias.  Skin: Negative.   Neurological:  Negative for dizziness and weakness.  Psychiatric/Behavioral:  Negative for confusion, dysphoric mood and sleep disturbance.     Immunization History  Administered Date(s) Administered   Fluad Quad(high Dose 65+) 07/28/2022   Fluad Trivalent(High Dose 65+) 12/19/2023   Influenza Split 07/26/2009, 07/31/2010, 08/13/2011, 09/02/2012, 09/20/2013, 08/04/2019, 08/24/2020   Influenza,inj,Quad PF,6+ Mos 07/29/2017   Moderna Sars-Covid-2 Vaccination 11/22/2019, 12/06/2019, 08/31/2020   PNEUMOCOCCAL CONJUGATE-20 11/24/2022   Pfizer(Comirnaty)Fall Seasonal Vaccine 12 years and older 08/12/2022   Pneumococcal Conjugate-13 10/02/2014   Pneumococcal Polysaccharide-23 03/21/2009, 04/22/2017   Td 03/21/2009   Tdap 08/18/2019   Zoster, Live 03/15/2013, 09/13/2019, 03/06/2020   Pertinent  Health Maintenance Due  Topic Date Due   OPHTHALMOLOGY EXAM  Never done   DEXA SCAN  Never done   HEMOGLOBIN A1C  06/09/2024   FOOT EXAM  08/25/2024   INFLUENZA VACCINE  Completed      10/19/2023   11:34 AM  Fall Risk  Falls in the past year? 0  Was there an injury with Fall? 0  Fall Risk Category Calculator 0  Patient at Risk for Falls Due to No Fall Risks  Fall risk Follow up Falls evaluation completed   Functional Status Survey:    Vitals:   12/28/23 1115  BP: 111/70  Pulse: 61  Resp: 17  Temp: 98 F (36.7 C)  SpO2: 94%  Weight: 202 lb 12.8 oz (92 kg)  Height: 5\' 5"  (1.651 m)   Body mass index is 33.75 kg/m. Physical Exam Vitals reviewed.  Constitutional:      Appearance: Normal appearance.  HENT:     Head: Normocephalic.     Nose: Nose normal.     Mouth/Throat:     Mouth: Mucous membranes are moist.      Pharynx: Oropharynx is clear.  Eyes:     Pupils: Pupils are equal, round, and reactive to light.  Cardiovascular:     Rate and Rhythm: Normal rate and regular rhythm.     Pulses: Normal pulses.     Heart sounds: Normal heart sounds. No murmur heard. Pulmonary:     Effort: Pulmonary effort is normal.     Breath sounds: Normal breath sounds.  Abdominal:     General: Abdomen is flat. Bowel sounds are normal.     Palpations: Abdomen is soft.  Musculoskeletal:        General: Swelling present.     Cervical back: Neck supple.  Skin:    General: Skin is warm.  Neurological:  General: No focal deficit present.     Mental Status: She is alert and oriented to person, place, and time.  Psychiatric:        Mood and Affect: Mood normal.        Thought Content: Thought content normal.     Labs reviewed: Recent Labs    12/21/23 0000  NA 142  K 3.4*  CL 103  CO2 29*  BUN 26*  CREATININE 0.7  CALCIUM 8.9   No results for input(s): "AST", "ALT", "ALKPHOS", "BILITOT", "PROT", "ALBUMIN" in the last 8760 hours. No results for input(s): "WBC", "NEUTROABS", "HGB", "HCT", "MCV", "PLT" in the last 8760 hours. No results found for: "TSH" Lab Results  Component Value Date   HGBA1C 7.3 12/11/2023   Lab Results  Component Value Date   CHOL 157 12/11/2023   HDL 72 (A) 12/11/2023   LDLCALC 72 12/11/2023   TRIG 66 12/11/2023    Significant Diagnostic Results in last 30 days:  No results found.  Assessment/Plan 1. Bilateral leg edema (Primary) Change lasix to 20 mg 3/week with extra as needed Compression Stockings Will repeat BMP 2. Essential hypertension Benicar HCTZ  3. Primary osteoarthritis of both knees Tylenol Cannot walk Plan to go to SNF    Family/ staff Communication:   Labs/tests ordered:  BMP

## 2023-12-29 DIAGNOSIS — Z9181 History of falling: Secondary | ICD-10-CM | POA: Diagnosis not present

## 2023-12-29 DIAGNOSIS — M6259 Muscle wasting and atrophy, not elsewhere classified, multiple sites: Secondary | ICD-10-CM | POA: Diagnosis not present

## 2023-12-29 DIAGNOSIS — R2689 Other abnormalities of gait and mobility: Secondary | ICD-10-CM | POA: Diagnosis not present

## 2024-01-01 DIAGNOSIS — M6259 Muscle wasting and atrophy, not elsewhere classified, multiple sites: Secondary | ICD-10-CM | POA: Diagnosis not present

## 2024-01-01 DIAGNOSIS — Z9181 History of falling: Secondary | ICD-10-CM | POA: Diagnosis not present

## 2024-01-01 DIAGNOSIS — R2689 Other abnormalities of gait and mobility: Secondary | ICD-10-CM | POA: Diagnosis not present

## 2024-01-04 DIAGNOSIS — Z9181 History of falling: Secondary | ICD-10-CM | POA: Diagnosis not present

## 2024-01-04 DIAGNOSIS — R278 Other lack of coordination: Secondary | ICD-10-CM | POA: Diagnosis not present

## 2024-01-05 ENCOUNTER — Ambulatory Visit: Payer: Medicare Other | Admitting: Podiatry

## 2024-01-06 DIAGNOSIS — Z9181 History of falling: Secondary | ICD-10-CM | POA: Diagnosis not present

## 2024-01-06 DIAGNOSIS — R278 Other lack of coordination: Secondary | ICD-10-CM | POA: Diagnosis not present

## 2024-01-08 DIAGNOSIS — R278 Other lack of coordination: Secondary | ICD-10-CM | POA: Diagnosis not present

## 2024-01-08 DIAGNOSIS — Z9181 History of falling: Secondary | ICD-10-CM | POA: Diagnosis not present

## 2024-01-11 DIAGNOSIS — E1142 Type 2 diabetes mellitus with diabetic polyneuropathy: Secondary | ICD-10-CM | POA: Diagnosis not present

## 2024-01-20 ENCOUNTER — Ambulatory Visit: Admitting: Podiatry

## 2024-01-21 ENCOUNTER — Encounter: Payer: Self-pay | Admitting: Adult Health

## 2024-01-21 ENCOUNTER — Non-Acute Institutional Stay (SKILLED_NURSING_FACILITY): Admitting: Adult Health

## 2024-01-21 DIAGNOSIS — R0989 Other specified symptoms and signs involving the circulatory and respiratory systems: Secondary | ICD-10-CM

## 2024-01-21 DIAGNOSIS — R600 Localized edema: Secondary | ICD-10-CM

## 2024-01-21 DIAGNOSIS — E113293 Type 2 diabetes mellitus with mild nonproliferative diabetic retinopathy without macular edema, bilateral: Secondary | ICD-10-CM

## 2024-01-21 NOTE — Progress Notes (Signed)
 Location:  [facility] Service:  SNF (31) Provider:   Peggye Ley, ANP [facility] [phone]   Mahlon Gammon, MD  Patient Care Team: Mahlon Gammon, MD as PCP - General (Internal Medicine)  Extended Emergency Contact Information Primary Emergency Contact: trotter,[name] Mobile Phone: [phone] Relation: Daughter  Code Status:  Full  Goals of care: Advanced Directive information    12/28/2023   12:43 PM  Advanced Directives  Does Patient Have a Medical Advance Directive? Yes  Type of Advance Directive Living will  Does patient want to make changes to medical advance directive? No - Patient declined     Chief Complaint  Patient presents with   Acute Visit    F/u edema bp issues.     HPI:  Pt is a 89 y.o. female seen today for an acute visit for bp issues.   BP 150-169 range systolic past two days. Lasix held per resident request due to going out with a friend and wanting to avoid frequency and/or incontinence.  BP recheck this am 111/70  Her edema is improved. Wearing compression hose.  No sob or exertion of PND Weight was not taken. Trending upward in matrix by 2 lbs.  Pro BNP 251 2 D echo 12/31/23 mild mitral insuff, aortic valve sclerosis, very small pericardial effusion, EF 60-65%, left wall thickness normal, abnormal relaxation was noted.   Type 2 DM: on ozempic and glipizide. A1C 7.3 12/11/23 CBG 130-160 Has apt with podiatry Last eye exam. Nov 2024 per Note from Dr Margaretann Loveless   Past Medical History:  Diagnosis Date   Diabetes (HCC)    HLD (hyperlipidemia)    HTN (hypertension)    Hypercholesteremia    Low back pain    Obesities, morbid (HCC)    Past Surgical History:  Procedure Laterality Date   APPENDECTOMY     TONSILLECTOMY      Allergies  Allergen Reactions   Arthro-Complex [Nutritional Supplements]    Empagliflozin Other (See Comments)    Other reaction(s): yeast infection   Levaquin  [Levofloxacin In D5w]    Levofloxacin Other (See Comments)   Lipitor [Atorvastatin]    Metformin Hcl Other (See Comments)    Other reaction(s): stomach cramps   Penicillin G     Other reaction(s): throat closing up   Penicillins    Terbinafine Other (See Comments)    Other reaction(s): Unknown    Outpatient Encounter Medications as of 01/21/2024  Medication Sig   acetaminophen (TYLENOL) 500 MG tablet 2 tabs as needed   atorvastatin (LIPITOR) 20 MG tablet Take 20 mg by mouth daily.   Blood Glucose Monitoring Suppl (ACCU-CHEK GUIDE) w/Device KIT    cholecalciferol (VITAMIN D) 1000 UNITS tablet Take 1,000 Units by mouth daily.   diltiazem (CARDIZEM CD) 180 MG 24 hr capsule Take 1 capsule (180 mg total) by mouth daily.   escitalopram (LEXAPRO) 10 MG tablet Take 10 mg by mouth daily.   furosemide (LASIX) 40 MG tablet Take 40 mg by mouth 3 (three) times a week. 3 times   glipiZIDE (GLUCOTROL XL) 2.5 MG 24 hr tablet Take 2.5 mg by mouth every morning.   hydrOXYzine (ATARAX) 10 MG tablet Take 1 tablet (10 mg total) by mouth daily as needed.   meloxicam (MOBIC) 7.5 MG tablet Take 7.5 mg by mouth daily as needed for pain.   Multiple Vitamin (MULTIVITAMIN ADULT PO) Take by mouth.  1 tablet; oral Once A Morning;   olmesartan-hydrochlorothiazide (  BENICAR HCT) 40-25 MG tablet Take 1 tablet by mouth daily.   OZEMPIC, 0.25 OR 0.5 MG/DOSE, 2 MG/3ML SOPN Inject 0.5 mg into the skin once a week.   potassium chloride SA (KLOR-CON M) 20 MEQ tablet Take 20 mEq by mouth 3 (three) times a week.   No facility-administered encounter medications on file as of 01/21/2024.    Review of Systems  Constitutional:  Negative for activity change, appetite change, chills, diaphoresis, fatigue, fever and unexpected weight change.  HENT:  Negative for congestion.   Respiratory:  Negative for cough, shortness of breath and wheezing.   Cardiovascular:  Positive for leg swelling. Negative for chest pain and palpitations.   Gastrointestinal:  Negative for abdominal distention, abdominal pain, constipation and diarrhea.  Genitourinary:  Negative for difficulty urinating and dysuria.  Musculoskeletal:  Positive for gait problem. Negative for arthralgias, back pain, joint swelling and myalgias.  Neurological:  Negative for dizziness, tremors, seizures, syncope, facial asymmetry, speech difficulty, weakness, light-headedness, numbness and headaches.  Psychiatric/Behavioral:  Negative for agitation, behavioral problems and confusion.     Immunization History  Administered Date(s) Administered   Fluad Quad(high Dose 65+) 07/28/2022   Fluad Trivalent(High Dose 65+) 12/19/2023   Influenza Split 07/26/2009, 07/31/2010, 08/13/2011, 09/02/2012, 09/20/2013, 08/04/2019, 08/24/2020   Influenza,inj,Quad PF,6+ Mos 07/29/2017   Moderna Sars-Covid-2 Vaccination 11/22/2019, 12/06/2019, 08/31/2020   PNEUMOCOCCAL CONJUGATE-20 11/24/2022   Pfizer(Comirnaty)Fall Seasonal Vaccine 12 years and older 08/12/2022   Pneumococcal Conjugate-13 10/02/2014   Pneumococcal Polysaccharide-23 03/21/2009, 04/22/2017   Td 03/21/2009   Tdap 08/18/2019   Zoster, Live 03/15/2013, 09/13/2019, 03/06/2020   Pertinent  Health Maintenance Due  Topic Date Due   OPHTHALMOLOGY EXAM  Never done   DEXA SCAN  Never done   HEMOGLOBIN A1C  06/09/2024   FOOT EXAM  08/25/2024   INFLUENZA VACCINE  Completed      10/19/2023   11:34 AM  Fall Risk  Falls in the past year? 0  Was there an injury with Fall? 0  Fall Risk Category Calculator 0  Patient at Risk for Falls Due to No Fall Risks  Fall risk Follow up Falls evaluation completed   Functional Status Survey:    Vitals:   01/21/24 1527  BP: 110/70  Pulse: 80  Resp: 17  Temp: 97.7 F (36.5 C)  SpO2: 93%   There is no height or weight on file to calculate BMI. Physical Exam Vitals and nursing note reviewed.  Constitutional:      General: She is not in acute distress.    Appearance:  Normal appearance. She is not diaphoretic.  HENT:     Head: Normocephalic and atraumatic.  Neck:     Thyroid: No thyromegaly.     Vascular: No carotid bruit or JVD.  Cardiovascular:     Rate and Rhythm: Normal rate and regular rhythm.     Heart sounds: Murmur heard.  Pulmonary:     Effort: Pulmonary effort is normal. No respiratory distress.     Breath sounds: Normal breath sounds. No stridor.  Abdominal:     General: Bowel sounds are normal. There is no distension.     Palpations: Abdomen is soft.     Tenderness: There is no abdominal tenderness.  Musculoskeletal:     Cervical back: No rigidity. No muscular tenderness.     Right lower leg: Edema present.     Left lower leg: Edema present.  Lymphadenopathy:     Cervical: No cervical adenopathy.  Skin:    General:  Skin is warm and dry.  Neurological:     General: No focal deficit present.     Mental Status: She is alert and oriented to person, place, and time. Mental status is at baseline.     Cranial Nerves: No cranial nerve deficit.  Psychiatric:        Mood and Affect: Mood normal.     Labs reviewed: Recent Labs    12/21/23 0000  NA 142  K 3.4*  CL 103  CO2 29*  BUN 26*  CREATININE 0.7  CALCIUM 8.9   No results for input(s): "AST", "ALT", "ALKPHOS", "BILITOT", "PROT", "ALBUMIN" in the last 8760 hours. No results for input(s): "WBC", "NEUTROABS", "HGB", "HCT", "MCV", "PLT" in the last 8760 hours. No results found for: "TSH" Lab Results  Component Value Date   HGBA1C 7.3 12/11/2023   Lab Results  Component Value Date   CHOL 157 12/11/2023   HDL 72 (A) 12/11/2023   LDLCALC 72 12/11/2023   TRIG 66 12/11/2023    Significant Diagnostic Results in last 30 days:  No results found.  Assessment/Plan  1. Bilateral leg edema (Primary) Abnormal relaxation on echo diastolic dysfunction.  Change lasix to Monday, Tuesday Thursday as she has something she does on Friday and wants to avoid incontinence. If she  misses or holds a dose ok to take the next day.  Monitor weights weekly  Continue compression hose.   2. Labile blood pressure Off benicar due to low bp Now having an occasional high but this may be related to holding lasix.  See above.   3. Type 2 diabetes mellitus with both eyes affected by mild nonproliferative retinopathy without macular edema, without long-term current use of insulin (HCC) Controlled on ozempic and glipizide.    Family/ staff Communication: nurse and resident.   Labs/tests ordered:  BMP done in March BUN 11 Cr 0.45 needs to be abstracted.

## 2024-01-22 ENCOUNTER — Non-Acute Institutional Stay (SKILLED_NURSING_FACILITY): Payer: Self-pay | Admitting: Adult Health

## 2024-01-22 ENCOUNTER — Encounter: Payer: Self-pay | Admitting: Adult Health

## 2024-01-22 DIAGNOSIS — R0602 Shortness of breath: Secondary | ICD-10-CM | POA: Diagnosis not present

## 2024-01-22 DIAGNOSIS — M25511 Pain in right shoulder: Secondary | ICD-10-CM | POA: Diagnosis not present

## 2024-01-22 DIAGNOSIS — I517 Cardiomegaly: Secondary | ICD-10-CM | POA: Diagnosis not present

## 2024-01-22 DIAGNOSIS — I5033 Acute on chronic diastolic (congestive) heart failure: Secondary | ICD-10-CM

## 2024-01-22 DIAGNOSIS — Z9181 History of falling: Secondary | ICD-10-CM | POA: Diagnosis not present

## 2024-01-22 DIAGNOSIS — R278 Other lack of coordination: Secondary | ICD-10-CM | POA: Diagnosis not present

## 2024-01-22 DIAGNOSIS — E113293 Type 2 diabetes mellitus with mild nonproliferative diabetic retinopathy without macular edema, bilateral: Secondary | ICD-10-CM | POA: Diagnosis not present

## 2024-01-22 DIAGNOSIS — M19011 Primary osteoarthritis, right shoulder: Secondary | ICD-10-CM | POA: Diagnosis not present

## 2024-01-22 DIAGNOSIS — J9809 Other diseases of bronchus, not elsewhere classified: Secondary | ICD-10-CM | POA: Diagnosis not present

## 2024-01-22 MED ORDER — DICLOFENAC SODIUM 1 % EX GEL: 2.0000 g | Freq: Three times a day (TID) | CUTANEOUS | Status: AC

## 2024-01-22 MED ORDER — HYDROCODONE-ACETAMINOPHEN 5-325 MG PO TABS: 1.0000 | ORAL_TABLET | Freq: Two times a day (BID) | ORAL | 0 refills | Status: DC | PRN

## 2024-01-22 NOTE — Progress Notes (Signed)
 Location:  Medical illustrator of Service:  SNF (31) Provider:   Peggye Ley, ANP [facility] [phone]   Mahlon Gammon, MD  Patient Care Team: Mahlon Gammon, MD as PCP - General (Internal Medicine)  Extended Emergency Contact Information Primary Emergency Contact: trotter,[name] Mobile Phone: [phone] Relation: Daughter  Code Status:  Full  Goals of care: Advanced Directive information    12/28/2023   12:43 PM  Advanced Directives  Does Patient Have a Medical Advance Directive? Yes  Type of Advance Directive Living will  Does patient want to make changes to medical advance directive? No - Patient declined     Chief Complaint  Patient presents with   Acute Visit    Sob and right shoulder pain    HPI:  Pt is a 89 y.o. female seen today for an acute visit for sob and shoulder pain  This am she woke up feeling short of breath. Sats were 88% but improved when upright. Oxygen was applied for comfort. Sob resolved when upright. She took lasix on Monday but skipped Wednesday due to an outing. BP is 129/66. Temp 99.7 HR 80. No chest pain Regular heart rate. Has chronic murmur. Has not had a weight.  When she sat up quickly after she felt sharp pain in the right shoulder. She did not fall. Reports pain with Range of motion. She just took a dose of mobic and waiting on the effect. She is using a stand lift for transfers. Concern for proper positioning when standing in the lift. Has some chronic numbness in her fingers but nothing new.   Pro BNP 251 2 D echo 12/31/23 mild mitral insuff, aortic valve sclerosis, very small pericardial effusion, EF 60-65%, left wall thickness normal, abnormal relaxation was noted.   Type 2 DM: on ozempic and glipizide. A1C 7.3 12/11/23 CBG 130-160 Has apt with podiatry Last eye exam. Nov 2024 per Note from Dr Margaretann Loveless     Past Medical History:  Diagnosis Date   Diabetes (HCC)    HLD (hyperlipidemia)     HTN (hypertension)    Hypercholesteremia    Low back pain    Obesities, morbid (HCC)    Past Surgical History:  Procedure Laterality Date   APPENDECTOMY     TONSILLECTOMY      Allergies  Allergen Reactions   Arthro-Complex [Nutritional Supplements]    Empagliflozin Other (See Comments)    Other reaction(s): yeast infection   Levaquin [Levofloxacin In D5w]    Levofloxacin Other (See Comments)   Lipitor [Atorvastatin]    Metformin Hcl Other (See Comments)    Other reaction(s): stomach cramps   Penicillin G     Other reaction(s): throat closing up   Penicillins    Terbinafine Other (See Comments)    Other reaction(s): Unknown    Outpatient Encounter Medications as of 01/22/2024  Medication Sig   acetaminophen (TYLENOL) 500 MG tablet 2 tabs as needed   atorvastatin (LIPITOR) 20 MG tablet Take 20 mg by mouth daily.   Blood Glucose Monitoring Suppl (ACCU-CHEK GUIDE) w/Device KIT    cholecalciferol (VITAMIN D) 1000 UNITS tablet Take 1,000 Units by mouth daily.   diltiazem (CARDIZEM CD) 180 MG 24 hr capsule Take 1 capsule (180 mg total) by mouth daily.   escitalopram (LEXAPRO) 10 MG tablet Take 10 mg by mouth daily.   furosemide (LASIX) 40 MG tablet Take 40 mg by mouth 3 (three) times a week. 3 times   glipiZIDE (GLUCOTROL  XL) 2.5 MG 24 hr tablet Take 2.5 mg by mouth every morning.   hydrOXYzine (ATARAX) 10 MG tablet Take 1 tablet (10 mg total) by mouth daily as needed.   meloxicam (MOBIC) 7.5 MG tablet Take 7.5 mg by mouth daily as needed for pain.   Multiple Vitamin (MULTIVITAMIN ADULT PO) Take by mouth.  1 tablet; oral Once A Morning;   OZEMPIC, 0.25 OR 0.5 MG/DOSE, 2 MG/3ML SOPN Inject 0.5 mg into the skin once a week.   potassium chloride SA (KLOR-CON M) 20 MEQ tablet Take 20 mEq by mouth 3 (three) times a week.   No facility-administered encounter medications on file as of 01/22/2024.    Review of Systems  Constitutional:  Positive for activity change and fever (low  grade). Negative for appetite change, chills, diaphoresis, fatigue and unexpected weight change.  HENT:  Negative for congestion.   Respiratory:  Positive for shortness of breath (resolved). Negative for cough and wheezing.   Cardiovascular:  Positive for leg swelling. Negative for chest pain and palpitations.  Gastrointestinal:  Negative for abdominal distention, abdominal pain, constipation and diarrhea.  Genitourinary:  Negative for difficulty urinating and dysuria.  Musculoskeletal:  Positive for arthralgias and gait problem. Negative for back pain, joint swelling and myalgias.       Right shoulder pain acute Chronic knee pain  Neurological:  Negative for dizziness, tremors, seizures, syncope, facial asymmetry, speech difficulty, weakness, light-headedness, numbness and headaches.  Psychiatric/Behavioral:  Negative for agitation, behavioral problems and confusion.        Short term memory loss    Immunization History  Administered Date(s) Administered   Fluad Quad(high Dose 65+) 07/28/2022   Fluad Trivalent(High Dose 65+) 12/19/2023   Influenza Split 07/26/2009, 07/31/2010, 08/13/2011, 09/02/2012, 09/20/2013, 08/04/2019, 08/24/2020   Influenza,inj,Quad PF,6+ Mos 07/29/2017   Moderna Sars-Covid-2 Vaccination 11/22/2019, 12/06/2019, 08/31/2020   PNEUMOCOCCAL CONJUGATE-20 11/24/2022   Pfizer(Comirnaty)Fall Seasonal Vaccine 12 years and older 08/12/2022   Pneumococcal Conjugate-13 10/02/2014   Pneumococcal Polysaccharide-23 03/21/2009, 04/22/2017   Td 03/21/2009   Tdap 08/18/2019   Zoster, Live 03/15/2013, 09/13/2019, 03/06/2020   Pertinent  Health Maintenance Due  Topic Date Due   OPHTHALMOLOGY EXAM  Never done   HEMOGLOBIN A1C  06/09/2024   FOOT EXAM  08/25/2024   INFLUENZA VACCINE  Completed   DEXA SCAN  Discontinued      10/19/2023   11:34 AM  Fall Risk  Falls in the past year? 0  Was there an injury with Fall? 0  Fall Risk Category Calculator 0  Patient at Risk for  Falls Due to No Fall Risks  Fall risk Follow up Falls evaluation completed   Functional Status Survey:    Vitals:   01/22/24 1332  BP: 129/66  Pulse: 80  Resp: (!) 21  Temp: 99.7 F (37.6 C)  SpO2: 94%   There is no height or weight on file to calculate BMI. Physical Exam Vitals and nursing note reviewed.  Constitutional:      General: She is not in acute distress.    Appearance: Normal appearance. She is not diaphoretic.  HENT:     Head: Normocephalic and atraumatic.  Neck:     Thyroid: No thyromegaly.     Vascular: No carotid bruit or JVD.  Cardiovascular:     Rate and Rhythm: Normal rate and regular rhythm.     Heart sounds: Murmur heard.  Pulmonary:     Effort: Pulmonary effort is normal. No respiratory distress.     Breath  sounds: No stridor. Rales (very faint right and left base) present.  Abdominal:     General: Bowel sounds are normal. There is no distension.     Palpations: Abdomen is soft.     Tenderness: There is no abdominal tenderness.  Musculoskeletal:     Right shoulder: Tenderness present. No swelling, deformity, effusion, laceration or crepitus. Decreased range of motion. Normal pulse.     Left shoulder: Normal.     Cervical back: No rigidity. No muscular tenderness.     Right lower leg: Edema present.     Left lower leg: Edema present.     Comments: No redness or significant tenderness in the calf area.  Right shoulder with pos empty can test. Decreased range of motion  Lymphadenopathy:     Cervical: No cervical adenopathy.  Skin:    General: Skin is warm and dry.  Neurological:     Mental Status: She is alert and oriented to person, place, and time. Mental status is at baseline.  Psychiatric:        Mood and Affect: Mood normal.     Labs reviewed: Recent Labs    12/21/23 0000  NA 142  K 3.4*  CL 103  CO2 29*  BUN 26*  CREATININE 0.7  CALCIUM 8.9   No results for input(s): "AST", "ALT", "ALKPHOS", "BILITOT", "PROT", "ALBUMIN" in the  last 8760 hours. No results for input(s): "WBC", "NEUTROABS", "HGB", "HCT", "MCV", "PLT" in the last 8760 hours. No results found for: "TSH" Lab Results  Component Value Date   HGBA1C 7.3 12/11/2023   Lab Results  Component Value Date   CHOL 157 12/11/2023   HDL 72 (A) 12/11/2023   LDLCALC 72 12/11/2023   TRIG 66 12/11/2023    Significant Diagnostic Results in last 30 days:  No results found.  Assessment/Plan  1. Shortness of breath (Primary) Resolved with upright positioning No chest pain EKG 12 lead showed poor R wave progression. No ST abnormalities.  If worsening would send to the ED for possible CT of the chest Given lasix and monitor for improvement. Seems close to baseline right now.   2. Acute on chronic diastolic congestive heart failure (HCC) Give one dose of lasix 40 mg  CXR as above Consider cardiology eval if not improving.  3. Acute pain of right shoulder Pain with range of motion quite sharp Xray right shoulder Recommend voltaren gel and tylenol May use mobic as already ordered (monitor bp and fluid status, Cr) Add hydrocodone for severe pain for 5 days Consider ortho eval if not improving.   4. Type 2 diabetes mellitus with both eyes affected by mild nonproliferative retinopathy without macular edema, without long-term current use of insulin (HCC) Discussed with her daughter that the ozempic is expensive and has led to some fatigue and weakness. Her diabetes however is controlled and he ozempic has other benefits.  She has tried jardiance in the past but had a reaction. Also could not tolerate metformin per epic. Also she was on Rybelsus but this was costly. Currently on glipizide. Will consider risk vs benefit of continuing the Ozempic.       Labs/tests ordered:  BMP done in March BUN 11 Cr 0.45 needs to be abstracted.

## 2024-01-23 ENCOUNTER — Observation Stay (HOSPITAL_COMMUNITY)
Admission: EM | Admit: 2024-01-23 | Discharge: 2024-01-24 | Disposition: A | Discharge status: Skilled Nursing Facility | Attending: Emergency Medicine | Admitting: Emergency Medicine

## 2024-01-23 ENCOUNTER — Emergency Department (HOSPITAL_COMMUNITY)

## 2024-01-23 ENCOUNTER — Other Ambulatory Visit: Payer: Self-pay

## 2024-01-23 ENCOUNTER — Encounter (HOSPITAL_COMMUNITY): Payer: Self-pay | Admitting: Emergency Medicine

## 2024-01-23 DIAGNOSIS — J90 Pleural effusion, not elsewhere classified: Secondary | ICD-10-CM | POA: Diagnosis not present

## 2024-01-23 DIAGNOSIS — I5031 Acute diastolic (congestive) heart failure: Secondary | ICD-10-CM | POA: Diagnosis present

## 2024-01-23 DIAGNOSIS — E785 Hyperlipidemia, unspecified: Secondary | ICD-10-CM | POA: Diagnosis not present

## 2024-01-23 DIAGNOSIS — I110 Hypertensive heart disease with heart failure: Secondary | ICD-10-CM | POA: Diagnosis not present

## 2024-01-23 DIAGNOSIS — M25511 Pain in right shoulder: Secondary | ICD-10-CM | POA: Insufficient documentation

## 2024-01-23 DIAGNOSIS — I5033 Acute on chronic diastolic (congestive) heart failure: Secondary | ICD-10-CM | POA: Diagnosis not present

## 2024-01-23 DIAGNOSIS — Z0389 Encounter for observation for other suspected diseases and conditions ruled out: Secondary | ICD-10-CM | POA: Diagnosis not present

## 2024-01-23 DIAGNOSIS — E119 Type 2 diabetes mellitus without complications: Secondary | ICD-10-CM | POA: Insufficient documentation

## 2024-01-23 DIAGNOSIS — Z79899 Other long term (current) drug therapy: Secondary | ICD-10-CM | POA: Diagnosis not present

## 2024-01-23 DIAGNOSIS — J9601 Acute respiratory failure with hypoxia: Secondary | ICD-10-CM

## 2024-01-23 DIAGNOSIS — M79601 Pain in right arm: Secondary | ICD-10-CM | POA: Diagnosis not present

## 2024-01-23 DIAGNOSIS — F32A Depression, unspecified: Secondary | ICD-10-CM | POA: Insufficient documentation

## 2024-01-23 DIAGNOSIS — R6889 Other general symptoms and signs: Secondary | ICD-10-CM | POA: Diagnosis not present

## 2024-01-23 DIAGNOSIS — Z743 Need for continuous supervision: Secondary | ICD-10-CM | POA: Diagnosis not present

## 2024-01-23 DIAGNOSIS — E876 Hypokalemia: Secondary | ICD-10-CM | POA: Insufficient documentation

## 2024-01-23 DIAGNOSIS — R0602 Shortness of breath: Secondary | ICD-10-CM | POA: Diagnosis present

## 2024-01-23 DIAGNOSIS — J810 Acute pulmonary edema: Principal | ICD-10-CM

## 2024-01-23 DIAGNOSIS — Z1152 Encounter for screening for COVID-19: Secondary | ICD-10-CM | POA: Insufficient documentation

## 2024-01-23 LAB — COMPREHENSIVE METABOLIC PANEL WITH GFR
ALT: 15 U/L (ref 0–44)
AST: 30 U/L (ref 15–41)
Albumin: 2.7 g/dL — ABNORMAL LOW (ref 3.5–5.0)
Alkaline Phosphatase: 105 U/L (ref 38–126)
Anion gap: 6 (ref 5–15)
BUN: 13 mg/dL (ref 8–23)
CO2: 27 mmol/L (ref 22–32)
Calcium: 8.8 mg/dL — ABNORMAL LOW (ref 8.9–10.3)
Chloride: 107 mmol/L (ref 98–111)
Creatinine, Ser: 0.43 mg/dL — ABNORMAL LOW (ref 0.44–1.00)
GFR, Estimated: >60 mL/min (ref >60)
Glucose, Bld: 129 mg/dL — ABNORMAL HIGH (ref 70–99)
Potassium: 4 mmol/L (ref 3.5–5.1)
Sodium: 140 mmol/L (ref 135–145)
Total Bilirubin: 1.7 mg/dL — ABNORMAL HIGH (ref 0.0–1.2)
Total Protein: 5.8 g/dL — ABNORMAL LOW (ref 6.5–8.1)

## 2024-01-23 LAB — BLOOD GAS, VENOUS
Acid-Base Excess: 6.8 mmol/L — ABNORMAL HIGH (ref 0.0–2.0)
Bicarbonate: 31.3 mmol/L — ABNORMAL HIGH (ref 20.0–28.0)
O2 Saturation: 78.4 %
Patient temperature: 37
pCO2, Ven: 43 mmHg — ABNORMAL LOW (ref 44–60)
pH, Ven: 7.47 — ABNORMAL HIGH (ref 7.25–7.43)
pO2, Ven: 43 mmHg (ref 32–45)

## 2024-01-23 LAB — CBC WITH DIFFERENTIAL/PLATELET
Abs Immature Granulocytes: 0.04 10*3/uL (ref 0.00–0.07)
Basophils Absolute: 0.1 10*3/uL (ref 0.0–0.1)
Basophils Relative: 1 %
Eosinophils Absolute: 0.5 10*3/uL (ref 0.0–0.5)
Eosinophils Relative: 5 %
HCT: 35.5 % — ABNORMAL LOW (ref 36.0–46.0)
Hemoglobin: 11.7 g/dL — ABNORMAL LOW (ref 12.0–15.0)
Immature Granulocytes: 0 %
Lymphocytes Relative: 23 %
Lymphs Abs: 2.2 10*3/uL (ref 0.7–4.0)
MCH: 30.5 pg (ref 26.0–34.0)
MCHC: 33 g/dL (ref 30.0–36.0)
MCV: 92.7 fL (ref 80.0–100.0)
Monocytes Absolute: 1.3 10*3/uL — ABNORMAL HIGH (ref 0.1–1.0)
Monocytes Relative: 13 %
Neutro Abs: 5.6 10*3/uL (ref 1.7–7.7)
Neutrophils Relative %: 58 %
Platelets: 291 10*3/uL (ref 150–400)
RBC: 3.83 MIL/uL — ABNORMAL LOW (ref 3.87–5.11)
RDW: 13.6 % (ref 11.5–15.5)
WBC: 9.7 10*3/uL (ref 4.0–10.5)
nRBC: 0 % (ref 0.0–0.2)

## 2024-01-23 LAB — BRAIN NATRIURETIC PEPTIDE: B Natriuretic Peptide: 134.5 pg/mL — ABNORMAL HIGH (ref 0.0–100.0)

## 2024-01-23 LAB — GLUCOSE, CAPILLARY: Glucose-Capillary: 155 mg/dL — ABNORMAL HIGH (ref 70–99)

## 2024-01-23 LAB — URINALYSIS, W/ REFLEX TO CULTURE (INFECTION SUSPECTED)
Bilirubin Urine: NEGATIVE
Glucose, UA: NEGATIVE mg/dL
Hgb urine dipstick: NEGATIVE
Ketones, ur: NEGATIVE mg/dL
Leukocytes,Ua: NEGATIVE
Nitrite: NEGATIVE
Protein, ur: NEGATIVE mg/dL
Specific Gravity, Urine: 1.019 (ref 1.005–1.030)
pH: 5 (ref 5.0–8.0)

## 2024-01-23 LAB — TROPONIN I (HIGH SENSITIVITY)
Troponin I (High Sensitivity): 11 ng/L (ref <18)
Troponin I (High Sensitivity): 13 ng/L (ref <18)

## 2024-01-23 LAB — I-STAT CG4 LACTIC ACID, ED: Lactic Acid, Venous: 1.1 mmol/L (ref 0.5–1.9)

## 2024-01-23 LAB — CBG MONITORING, ED: Glucose-Capillary: 132 mg/dL — ABNORMAL HIGH (ref 70–99)

## 2024-01-23 MED ORDER — ACETAMINOPHEN 650 MG RE SUPP: 650.0000 mg | Freq: Four times a day (QID) | RECTAL | Status: DC | PRN

## 2024-01-23 MED ORDER — ALBUTEROL SULFATE (2.5 MG/3ML) 0.083% IN NEBU: 2.5000 mg | INHALATION_SOLUTION | RESPIRATORY_TRACT | Status: DC | PRN

## 2024-01-23 MED ORDER — ONDANSETRON HCL 4 MG PO TABS: 4.0000 mg | ORAL_TABLET | Freq: Four times a day (QID) | ORAL | Status: DC | PRN

## 2024-01-23 MED ORDER — POTASSIUM CHLORIDE CRYS ER 20 MEQ PO TBCR: 20.0000 meq | EXTENDED_RELEASE_TABLET | Freq: Every day | ORAL | Status: DC

## 2024-01-23 MED ORDER — ADULT MULTIVITAMIN W/MINERALS CH
1.0000 | ORAL_TABLET | Freq: Every day | ORAL | Status: DC
  Administered 2024-01-24: 1 via ORAL
  Filled 2024-01-23: qty 1

## 2024-01-23 MED ORDER — INSULIN ASPART 100 UNIT/ML IJ SOLN
0.0000 [IU] | Freq: Three times a day (TID) | INTRAMUSCULAR | Status: DC
  Administered 2024-01-24: 2 [IU] via SUBCUTANEOUS
  Administered 2024-01-24: 3 [IU] via SUBCUTANEOUS
  Filled 2024-01-23: qty 0.15

## 2024-01-23 MED ORDER — DILTIAZEM HCL ER COATED BEADS 180 MG PO CP24
180.0000 mg | ORAL_CAPSULE | Freq: Every day | ORAL | Status: DC
  Administered 2024-01-24: 180 mg via ORAL
  Filled 2024-01-23: qty 1

## 2024-01-23 MED ORDER — ONDANSETRON HCL 4 MG/2ML IJ SOLN: 4.0000 mg | Freq: Four times a day (QID) | INTRAMUSCULAR | Status: DC | PRN

## 2024-01-23 MED ORDER — FUROSEMIDE 10 MG/ML IJ SOLN
40.0000 mg | Freq: Once | INTRAMUSCULAR | Status: AC
  Administered 2024-01-23: 40 mg via INTRAVENOUS
  Filled 2024-01-23: qty 4

## 2024-01-23 MED ORDER — HYDROCODONE-ACETAMINOPHEN 5-325 MG PO TABS: 1.0000 | ORAL_TABLET | Freq: Two times a day (BID) | ORAL | Status: DC | PRN

## 2024-01-23 MED ORDER — ESCITALOPRAM OXALATE 10 MG PO TABS
10.0000 mg | ORAL_TABLET | Freq: Every day | ORAL | Status: DC
  Administered 2024-01-24: 10 mg via ORAL
  Filled 2024-01-23: qty 1

## 2024-01-23 MED ORDER — INSULIN ASPART 100 UNIT/ML IJ SOLN
0.0000 [IU] | Freq: Every day | INTRAMUSCULAR | Status: DC
  Filled 2024-01-23: qty 0.05

## 2024-01-23 MED ORDER — HYDROXYZINE HCL 10 MG PO TABS: 10.0000 mg | ORAL_TABLET | ORAL | Status: DC | PRN

## 2024-01-23 MED ORDER — MULTIVITAMIN ADULT PO TABS: ORAL_TABLET | Freq: Every day | ORAL | Status: DC

## 2024-01-23 MED ORDER — ACETAMINOPHEN 325 MG PO TABS
650.0000 mg | ORAL_TABLET | Freq: Four times a day (QID) | ORAL | Status: DC | PRN
  Administered 2024-01-24: 650 mg via ORAL
  Filled 2024-01-23: qty 2

## 2024-01-23 MED ORDER — ATORVASTATIN CALCIUM 20 MG PO TABS
20.0000 mg | ORAL_TABLET | Freq: Every day | ORAL | Status: DC
  Administered 2024-01-24: 20 mg via ORAL
  Filled 2024-01-23: qty 1

## 2024-01-23 MED ORDER — ENOXAPARIN SODIUM 40 MG/0.4ML IJ SOSY
40.0000 mg | PREFILLED_SYRINGE | INTRAMUSCULAR | Status: DC
  Administered 2024-01-23 – 2024-01-24 (×2): 40 mg via SUBCUTANEOUS
  Filled 2024-01-23 (×2): qty 0.4

## 2024-01-23 MED ORDER — FUROSEMIDE 10 MG/ML IJ SOLN
40.0000 mg | Freq: Every day | INTRAMUSCULAR | Status: DC
  Administered 2024-01-24: 40 mg via INTRAVENOUS
  Filled 2024-01-23: qty 4

## 2024-01-23 NOTE — ED Triage Notes (Signed)
 Pt bib ems from [facility]. Pt was being moved by staff yesterday afternoon and they said right shoulder visibly dislocated. Xrays and reduction performed afterwards. Per EMS staff was moving pt due to low oxygen sats. Denies N/V/D. Pain only with movement.

## 2024-01-23 NOTE — H&P (Incomplete)
 History and Physical  Patricia Petersen ZOX:[id number] DOB: 30-Oct-1933 DOA: 01/23/2024  PCP: Mahlon Gammon, MD   Chief Complaint: ***   HPI: Patricia Petersen is a 89 y.o. female with medical history significant for ***   Review of Systems: Please see HPI for pertinent positives and negatives. A complete 10 system review of systems are otherwise negative.  Past Medical History:  Diagnosis Date   Diabetes (HCC)    HLD (hyperlipidemia)    HTN (hypertension)    Hypercholesteremia    Low back pain    Obesities, morbid (HCC)    Past Surgical History:  Procedure Laterality Date   APPENDECTOMY     TONSILLECTOMY     Social History:  reports that she has never smoked. She has never used smokeless tobacco. No history on file for alcohol use and drug use.  Allergies  Allergen Reactions   Penicillin G Anaphylaxis    Throat closing up   Arthro-Complex [Nutritional Supplements] Other (See Comments)    Allergy not listed on MAR    Empagliflozin Other (See Comments)    Yeast infection   Levaquin [Levofloxacin In D5w] Other (See Comments)    Reaction not listed on MAR    Levofloxacin Other (See Comments)    Reaction not listed on MAR    Lipitor [Atorvastatin] Other (See Comments)    Allergy not listed on MAR    Metformin Hcl Other (See Comments)    Stomach cramps   Terbinafine Other (See Comments)    Reaction not listed on MAR    Family History  Problem Relation Age of Onset   Heart disease Other      Prior to Admission medications   Medication Sig Start Date End Date Taking? Authorizing Provider  acetaminophen (TYLENOL) 500 MG tablet Take 1,000 mg by mouth 3 (three) times daily as needed (reason not listed on MAR).   Yes [provider]  atorvastatin (LIPITOR) 20 MG tablet Take 20 mg by mouth daily. 10/20/19  Yes [provider]  cholecalciferol (VITAMIN D) 1000 UNITS tablet Take 1,000 Units by mouth daily.   Yes [provider]  diclofenac Sodium  (VOLTAREN ARTHRITIS PAIN) 1 % GEL Apply 2 g topically in the morning, at noon, and at bedtime for 14 days. Patient taking differently: Apply 2 g topically in the morning, at noon, and at bedtime. Course 01/22/24-02/05/24 01/22/24 02/05/24 Yes Fletcher Anon, NP  diltiazem (CARDIZEM CD) 180 MG 24 hr capsule Take 1 capsule (180 mg total) by mouth daily. Patient taking differently: Take 180 mg by mouth daily. Hold if SBP is <120 10/20/23 10/19/24 Yes Mahlon Gammon, MD  escitalopram (LEXAPRO) 10 MG tablet Take 10 mg by mouth daily.   Yes [provider]  glipiZIDE (GLUCOTROL XL) 2.5 MG 24 hr tablet Take 2.5 mg by mouth every morning. 07/09/21  Yes [provider]  HYDROcodone-acetaminophen (NORCO/VICODIN) 5-325 MG tablet Take 1 tablet by mouth 2 (two) times daily as needed for up to 5 days for moderate pain (pain score 4-6). Patient taking differently: Take 1 tablet by mouth in the morning and at bedtime. For 5 days starting 03/38/25 01/22/24 01/27/24 Yes Fletcher Anon, NP  hydrOXYzine (ATARAX) 10 MG tablet Take 1 tablet (10 mg total) by mouth daily as needed. Patient taking differently: Take 10 mg by mouth as needed for anxiety. 12/10/23  Yes Wert, Trula Ore, NP  meloxicam (MOBIC) 7.5 MG tablet Take 7.5 mg by mouth daily as needed for pain.  Yes [provider]  Multiple Vitamin (MULTIVITAMIN ADULT PO) Take 1 tablet by mouth daily.   Yes [provider]  OZEMPIC, 0.25 OR 0.5 MG/DOSE, 2 MG/3ML SOPN Inject 0.5 mg into the skin once a week. 03/05/22  Yes [provider]  potassium chloride SA (KLOR-CON M) 20 MEQ tablet Take 20 mEq by mouth every Monday, Wednesday, and Friday.   Yes [provider]  furosemide (LASIX) 40 MG tablet Take 40 mg by mouth 3 (three) times a week. Monday, Tuesday, and Thursday Patient not taking: Reported on 01/23/2024    [provider]    Physical Exam: BP (!) 132/101 (BP Location: Right Arm)   Pulse 93   Temp 98.1  F (36.7 C) (Oral)   Resp 20   Ht 5\' 5"  (1.651 m)   Wt 91.6 kg   SpO2 100%   BMI 33.61 kg/m  ***         Labs on Admission:  Basic Metabolic Panel: Recent Labs  Lab 01/23/24 1247  NA 140  K 4.0  CL 107  CO2 27  GLUCOSE 129*  BUN 13  CREATININE 0.43*  CALCIUM 8.8*   Liver Function Tests: Recent Labs  Lab 01/23/24 1247  AST 30  ALT 15  ALKPHOS 105  BILITOT 1.7*  PROT 5.8*  ALBUMIN 2.7*   No results for input(s): "LIPASE", "AMYLASE" in the last 168 hours. No results for input(s): "AMMONIA" in the last 168 hours. CBC: Recent Labs  Lab 01/23/24 1247  WBC 9.7  NEUTROABS 5.6  HGB 11.7*  HCT 35.5*  MCV 92.7  PLT 291   Cardiac Enzymes: No results for input(s): "CKTOTAL", "CKMB", "CKMBINDEX", "TROPONINI" in the last 168 hours. BNP (last 3 results) Recent Labs    01/23/24 1247  BNP 134.5*    ProBNP (last 3 results) No results for input(s): "PROBNP" in the last 8760 hours.  CBG: No results for input(s): "GLUCAP" in the last 168 hours.  Radiological Exams on Admission: DG Chest Portable 1 View Result Date: 01/23/2024 CLINICAL DATA:  low oxygen EXAM: PORTABLE CHEST 1 VIEW COMPARISON:  January 24, 2006 FINDINGS: The cardiomediastinal silhouette is enlarged in contour. Small bilateral pleural effusions. No pneumothorax. Peribronchial cuffing with diffuse interstitial prominence. Several calcific densities project along the contours of the LEFT humerus and glenoid, incompletely assessed IMPRESSION: 1. Constellation of findings are favored to reflect pulmonary edema with small bilateral pleural effusions. 2. Several calcific densities project along the contours of the LEFT humerus and glenoid, incompletely assessed. Recommend dedicated radiographs of the LEFT shoulder. Electronically Signed   By: Meda Klinefelter M.D.   On: 01/23/2024 13:35   DG Shoulder Right Result Date: 01/23/2024 CLINICAL DATA:  Possible right shoulder dislocation. EXAM: RIGHT SHOULDER - 2+ VIEW  COMPARISON:  None Available. FINDINGS: There is no evidence of fracture or dislocation. Moderate narrowing of subacromial space is noted. Soft tissues are unremarkable. IMPRESSION: No fracture or dislocation is noted. Moderate narrowing of subacromial space is noted suggesting chronic rotator cuff injury. Electronically Signed   By: Lupita Raider M.D.   On: 01/23/2024 11:53   Assessment/Plan ***  DVT prophylaxis: Lovenox     Code Status: Do not attempt resuscitation (DNR) PRE-ARREST INTERVENTIONS DESIRED  Consults called: None  Admission status: ***   Time spent: *** minutes  Bernisha Verma Sharlette Dense MD [facility] Pager [phone]  If 7PM-7AM, please contact night-coverage [email or web address] Password [email or web address]  01/23/2024, 4:40 PM

## 2024-01-23 NOTE — ED Provider Notes (Signed)
 [facility] EMERGENCY DEPARTMENT AT [facility] Provider Note   CSN: [id number] Arrival date & time: 01/23/24  1050     History {Add pertinent medical, surgical, social history, OB history to HPI:1} Chief Complaint  Patient presents with  . Shoulder Injury    Patricia Petersen is a 89 y.o. female with a past medical history of aortic stenosis, heart failure, chronic knee pain, immobility who presents for evaluation of right shoulder pain.  According to the patient and her daughter who is at bedside the patient was noted by staff at [facility] to be short of breath yesterday.  Patient was pulled up in the bed and had significant pain in the right shoulder without history of shoulder pain or injury.  Patient was noted to have low oxygen saturation.  She had both a chest x-ray and a right shoulder x-ray ordered.  She was started on Lasix due to the thought that she may need increased diuresis.  She denies fevers chills body aches or cough.  Patient has remained on oxygen and was on 1 L via nasal cannula prior to coming to the emergency department.  Patient was sent because apparently the outpatient x-ray showed a subluxation of the right shoulder.  She complains of pain in the anterior part of her right shoulder.  She was given a single hydrocodone prior to arrival.  She denies nausea or vomiting.  She has chronic peripheral edema and stasis dermatitis without complaint of severe pain.  Does not walk at baseline after a fall in December that led to significant deconditioning.  Patient states she has never had shortness of breath like this in the past.  She denies a history of blood clots.   Shoulder Injury       Home Medications Prior to Admission medications   Medication Sig Start Date End Date Taking? Authorizing Provider  acetaminophen (TYLENOL) 500 MG tablet 2 tabs as needed    [provider]  atorvastatin (LIPITOR) 20 MG tablet Take 20 mg by mouth daily. 10/20/19    [provider]  Blood Glucose Monitoring Suppl (ACCU-CHEK GUIDE) w/Device KIT  06/11/21   [provider]  cholecalciferol (VITAMIN D) 1000 UNITS tablet Take 1,000 Units by mouth daily.    [provider]  diclofenac Sodium (VOLTAREN ARTHRITIS PAIN) 1 % GEL Apply 2 g topically in the morning, at noon, and at bedtime for 14 days. 01/22/24 02/05/24  Fletcher Anon, NP  diltiazem (CARDIZEM CD) 180 MG 24 hr capsule Take 1 capsule (180 mg total) by mouth daily. 10/20/23 10/19/24  Mahlon Gammon, MD  escitalopram (LEXAPRO) 10 MG tablet Take 10 mg by mouth daily.    [provider]  furosemide (LASIX) 40 MG tablet Take 40 mg by mouth 3 (three) times a week. 3 times    [provider]  glipiZIDE (GLUCOTROL XL) 2.5 MG 24 hr tablet Take 2.5 mg by mouth every morning. 07/09/21   [provider]  HYDROcodone-acetaminophen (NORCO/VICODIN) 5-325 MG tablet Take 1 tablet by mouth 2 (two) times daily as needed for up to 5 days for moderate pain (pain score 4-6). 01/22/24 01/27/24  Fletcher Anon, NP  hydrOXYzine (ATARAX) 10 MG tablet Take 1 tablet (10 mg total) by mouth daily as needed. 12/10/23   Fletcher Anon, NP  meloxicam (MOBIC) 7.5 MG tablet Take 7.5 mg by mouth daily as needed for pain.    [provider]  Multiple Vitamin (MULTIVITAMIN ADULT PO) Take by mouth.  1  tablet; oral Once A Morning;    [provider]  OZEMPIC, 0.25 OR 0.5 MG/DOSE, 2 MG/3ML SOPN Inject 0.5 mg into the skin once a week. 03/05/22   [provider]  potassium chloride SA (KLOR-CON M) 20 MEQ tablet Take 20 mEq by mouth 3 (three) times a week.    [provider]      Allergies    Arthro-complex [nutritional supplements], Empagliflozin, Levaquin [levofloxacin in d5w], Levofloxacin, Lipitor [atorvastatin], Metformin hcl, Penicillin g, Penicillins, and Terbinafine    Review of Systems   Review of Systems  Physical Exam Updated Vital Signs BP (!)  132/101 (BP Location: Right Arm)   Pulse 93   Temp 98.1 F (36.7 C) (Oral)   Resp 20   Ht 5\' 5"  (1.651 m)   Wt 91.6 kg   SpO2 100%   BMI 33.61 kg/m  Physical Exam Vitals and nursing note reviewed.  Constitutional:      General: She is not in acute distress.    Appearance: She is well-developed. She is not diaphoretic.     Comments: Patient feels hot to the touch.  HENT:     Head: Normocephalic and atraumatic.     Right Ear: External ear normal.     Left Ear: External ear normal.     Nose: Nose normal.     Mouth/Throat:     Mouth: Mucous membranes are moist.  Eyes:     General: No scleral icterus.    Conjunctiva/sclera: Conjunctivae normal.  Cardiovascular:     Rate and Rhythm: Normal rate and regular rhythm.     Heart sounds: Murmur heard.     No friction rub. No gallop.     Comments: Loud systolic murmur heard best in the aortic position consistent with aortic stenosis. Woody , chronic appearing edema Pulmonary:     Effort: Pulmonary effort is normal. No respiratory distress.     Breath sounds: Normal breath sounds.  Abdominal:     General: Bowel sounds are normal. There is no distension.     Palpations: Abdomen is soft. There is no mass.     Tenderness: There is no abdominal tenderness. There is no guarding.  Musculoskeletal:     Cervical back: Normal range of motion.     Right lower leg: 2+ Pitting Edema present.     Left lower leg: 2+ Pitting Edema present.     Comments: Right shoulder exam shows full range of motion with pain.  Tenderness along the anterior portion of the deltoid muscle.  Positive crossarm test with pain.   Skin:    General: Skin is warm and dry.  Neurological:     Mental Status: She is alert and oriented to person, place, and time.  Psychiatric:        Behavior: Behavior normal.     ED Results / Procedures / Treatments   Labs (all labs ordered are listed, but only abnormal results are displayed) Labs Reviewed  RESP PANEL BY RT-PCR (RSV,  FLU A&B, COVID)  [id number]  BRAIN NATRIURETIC PEPTIDE  COMPREHENSIVE METABOLIC PANEL WITH GFR  CBC WITH DIFFERENTIAL/PLATELET  BLOOD GAS, VENOUS  I-STAT CG4 LACTIC ACID, ED  TROPONIN I (HIGH SENSITIVITY)    EKG None  Radiology DG Shoulder Right Result Date: 01/23/2024 CLINICAL DATA:  Possible right shoulder dislocation. EXAM: RIGHT SHOULDER - 2+ VIEW COMPARISON:  None Available. FINDINGS: There is no evidence of fracture or dislocation. Moderate narrowing of subacromial space is noted. Soft tissues are unremarkable. IMPRESSION:  No fracture or dislocation is noted. Moderate narrowing of subacromial space is noted suggesting chronic rotator cuff injury. Electronically Signed   By: Lupita Raider M.D.   On: 01/23/2024 11:53    Procedures Procedures  {Document cardiac monitor, telemetry assessment procedure when appropriate:1}  Medications Ordered in ED Medications - No data to display  ED Course/ Medical Decision Making/ A&P Clinical Course as of 01/23/24 1503  Sat Jan 23, 2024  1427 Lactic Acid, Venous: 1.1 [AH]  1427 B Natriuretic Peptide(!): 134.5 [AH]  1433 BP(!): 132/101 [AH]  1502 Review of outpatient documentation sent in paper format shows that the patient requested not to take her Lasix on Wednesday because she is going out with a friend.  She had a 2 pound weight gain.  Although she was given doses of Lasix at her facility she has had progressively worsening hypoxia now requiring 2 L to maintain her oxygen saturation.  Blood pressure is elevated. [AH]  1503 DG Chest Portable 1 View Visualized and interpreted a 1 view chest x-ray which shows bilateral pulmonary edema.  Patient is not on 40 mEq of Lasix p.o. every other day.  I have ordered this is along with strict intake and output.  Will need admission for diuresis. [AH]    Clinical Course User Index [AH] Arthor Captain, PA-C   {   Click here for ABCD2, HEART and other calculatorsREFRESH Note before signing :1}                               Medical Decision Making Amount and/or Complexity of Data Reviewed Labs: ordered. Decision-making details documented in ED Course. Radiology: ordered.  Risk Prescription drug management. Decision regarding hospitalization.   ***  {Document critical care time when appropriate:1} {Document review of labs and clinical decision tools ie heart score, Chads2Vasc2 etc:1}  {Document your independent review of radiology images, and any outside records:1} {Document your discussion with family members, caretakers, and with consultants:1} {Document social determinants of health affecting pt's care:1} {Document your decision making why or why not admission, treatments were needed:1} Final Clinical Impression(s) / ED Diagnoses Final diagnoses:  None    Rx / DC Orders ED Discharge Orders     None

## 2024-01-23 NOTE — H&P (Signed)
 History and Physical  Patricia Petersen:[id number] DOB: April 30, 1934 DOA: 01/23/2024  PCP: Mahlon Gammon, MD   Chief Complaint: Shortness of breath  HPI: LAURALIE BLACKSHER is a 89 y.o. female with medical history significant for aortic valve sclerosis, diabetes, hypertension, hyperlipidemia, heart failure with preserved EF being admitted to the hospital with acute heart failure exacerbation.  History provided by the patient as well as her daughter who is at the bedside.  She lives at [facility] assisted [facility], she is bedbound at baseline.  She takes Lasix 3 times a week, skipped her dose this past Wednesday as she was meeting a friend.  Since that day, she has had a little bit of left lower extremity edema, and her daughter noticed that when she spoke to her on the phone she sounded a little bit dyspneic and had a bit of a wet cough.  Yesterday when she was being moved with the lift at the facility, she hurt her right shoulder and an x-ray was done.  As far as the patient is concerned, she was brought to the ER this morning after x-ray showed a right shoulder dislocation.  However based on notes, it seems that the shoulder was reduced at her facility.  Here in the emergency department as detailed below, there is evidence of acute hypoxic respiratory failure as the patient is requiring 2 L nasal cannula oxygen which she typically does not.  She also has elevated BNP.  She was given a dose of IV Lasix, and hospitalist was asked to admit.  Review of Systems: Please see HPI for pertinent positives and negatives. A complete 10 system review of systems are otherwise negative.  Past Medical History:  Diagnosis Date   Diabetes (HCC)    HLD (hyperlipidemia)    HTN (hypertension)    Hypercholesteremia    Low back pain    Obesities, morbid (HCC)    Past Surgical History:  Procedure Laterality Date   APPENDECTOMY     TONSILLECTOMY     Social History:  reports that she has never smoked. She has never  used smokeless tobacco. No history on file for alcohol use and drug use.  Allergies  Allergen Reactions   Penicillin G Anaphylaxis    Throat closing up   Arthro-Complex [Nutritional Supplements] Other (See Comments)    Allergy not listed on MAR    Empagliflozin Other (See Comments)    Yeast infection   Levaquin [Levofloxacin In D5w] Other (See Comments)    Reaction not listed on MAR    Levofloxacin Other (See Comments)    Reaction not listed on MAR    Lipitor [Atorvastatin] Other (See Comments)    Allergy not listed on MAR    Metformin Hcl Other (See Comments)    Stomach cramps   Terbinafine Other (See Comments)    Reaction not listed on MAR    Family History  Problem Relation Age of Onset   Heart disease Other      Prior to Admission medications   Medication Sig Start Date End Date Taking? Authorizing Provider  acetaminophen (TYLENOL) 500 MG tablet Take 1,000 mg by mouth 3 (three) times daily as needed (reason not listed on MAR).   Yes [provider]  atorvastatin (LIPITOR) 20 MG tablet Take 20 mg by mouth daily. 10/20/19  Yes [provider]  cholecalciferol (VITAMIN D) 1000 UNITS tablet Take 1,000 Units by mouth daily.   Yes [provider]  diclofenac Sodium (VOLTAREN ARTHRITIS PAIN) 1 %  GEL Apply 2 g topically in the morning, at noon, and at bedtime for 14 days. Patient taking differently: Apply 2 g topically in the morning, at noon, and at bedtime. Course 01/22/24-02/05/24 01/22/24 02/05/24 Yes Fletcher Anon, NP  diltiazem (CARDIZEM CD) 180 MG 24 hr capsule Take 1 capsule (180 mg total) by mouth daily. Patient taking differently: Take 180 mg by mouth daily. Hold if SBP is <120 10/20/23 10/19/24 Yes Mahlon Gammon, MD  escitalopram (LEXAPRO) 10 MG tablet Take 10 mg by mouth daily.   Yes [provider]  glipiZIDE (GLUCOTROL XL) 2.5 MG 24 hr tablet Take 2.5 mg by mouth every morning. 07/09/21  Yes [provider]   HYDROcodone-acetaminophen (NORCO/VICODIN) 5-325 MG tablet Take 1 tablet by mouth 2 (two) times daily as needed for up to 5 days for moderate pain (pain score 4-6). Patient taking differently: Take 1 tablet by mouth in the morning and at bedtime. For 5 days starting 03/38/25 01/22/24 01/27/24 Yes Fletcher Anon, NP  hydrOXYzine (ATARAX) 10 MG tablet Take 1 tablet (10 mg total) by mouth daily as needed. Patient taking differently: Take 10 mg by mouth as needed for anxiety. 12/10/23  Yes Wert, Trula Ore, NP  meloxicam (MOBIC) 7.5 MG tablet Take 7.5 mg by mouth daily as needed for pain.   Yes [provider]  Multiple Vitamin (MULTIVITAMIN ADULT PO) Take 1 tablet by mouth daily.   Yes [provider]  OZEMPIC, 0.25 OR 0.5 MG/DOSE, 2 MG/3ML SOPN Inject 0.5 mg into the skin once a week. 03/05/22  Yes [provider]  potassium chloride SA (KLOR-CON M) 20 MEQ tablet Take 20 mEq by mouth every Monday, Wednesday, and Friday.   Yes [provider]  furosemide (LASIX) 40 MG tablet Take 40 mg by mouth 3 (three) times a week. Monday, Tuesday, and Thursday Patient not taking: Reported on 01/23/2024    [provider]    Physical Exam: BP (!) 132/101 (BP Location: Right Arm)   Pulse 93   Temp 98.7 F (37.1 C) (Axillary)   Resp 20   Ht 5\' 5"  (1.651 m)   Wt 91.6 kg   SpO2 100%   BMI 33.61 kg/m  General:  Alert, oriented, calm, in no acute distress, she looks quite comfortable and is wearing 2 L nasal cannula oxygen.  Saturating 98%.  Able to speak in full sentences.  She does look moderately fluid overloaded. Cardiovascular: RRR, no murmurs or rubs, 1+ pitting bilateral lower extremity edema  Respiratory: clear to auscultation bilaterally, no wheezes, no crackles  Abdomen: soft, nontender, nondistended, normal bowel tones heard  Skin: dry, no rashes  Musculoskeletal: no joint effusions, normal range of motion  Psychiatric: appropriate affect, normal speech   Neurologic: extraocular muscles intact, clear speech, moving all extremities with intact sensorium         Labs on Admission:  Basic Metabolic Panel: Recent Labs  Lab 01/23/24 1247  NA 140  K 4.0  CL 107  CO2 27  GLUCOSE 129*  BUN 13  CREATININE 0.43*  CALCIUM 8.8*   Liver Function Tests: Recent Labs  Lab 01/23/24 1247  AST 30  ALT 15  ALKPHOS 105  BILITOT 1.7*  PROT 5.8*  ALBUMIN 2.7*   No results for input(s): "LIPASE", "AMYLASE" in the last 168 hours. No results for input(s): "AMMONIA" in the last 168 hours. CBC: Recent Labs  Lab 01/23/24 1247  WBC 9.7  NEUTROABS 5.6  HGB 11.7*  HCT 35.5*  MCV 92.7  PLT 291   Cardiac Enzymes: No results for input(s): "CKTOTAL", "CKMB", "CKMBINDEX", "TROPONINI" in the last 168 hours. BNP (last 3 results) Recent Labs    01/23/24 1247  BNP 134.5*    ProBNP (last 3 results) No results for input(s): "PROBNP" in the last 8760 hours.  CBG: Recent Labs  Lab 01/23/24 1643  GLUCAP 132*    Radiological Exams on Admission: DG Chest Portable 1 View Result Date: 01/23/2024 CLINICAL DATA:  low oxygen EXAM: PORTABLE CHEST 1 VIEW COMPARISON:  January 24, 2006 FINDINGS: The cardiomediastinal silhouette is enlarged in contour. Small bilateral pleural effusions. No pneumothorax. Peribronchial cuffing with diffuse interstitial prominence. Several calcific densities project along the contours of the LEFT humerus and glenoid, incompletely assessed IMPRESSION: 1. Constellation of findings are favored to reflect pulmonary edema with small bilateral pleural effusions. 2. Several calcific densities project along the contours of the LEFT humerus and glenoid, incompletely assessed. Recommend dedicated radiographs of the LEFT shoulder. Electronically Signed   By: Meda Klinefelter M.D.   On: 01/23/2024 13:35   DG Shoulder Right Result Date: 01/23/2024 CLINICAL DATA:  Possible right shoulder dislocation. EXAM: RIGHT SHOULDER - 2+ VIEW  COMPARISON:  None Available. FINDINGS: There is no evidence of fracture or dislocation. Moderate narrowing of subacromial space is noted. Soft tissues are unremarkable. IMPRESSION: No fracture or dislocation is noted. Moderate narrowing of subacromial space is noted suggesting chronic rotator cuff injury. Electronically Signed   By: Lupita Raider M.D.   On: 01/23/2024 11:53   2D echo: Report is not available for me to review, but patient did have an echo done at her facility at the beginning of this month.  Per outpatient notes, demonstrates mild mitral insuff, aortic valve sclerosis, very small pericardial effusion, EF 60-65%, left wall thickness normal, abnormal relaxation   Assessment/Plan CARNITA GOLOB is a 89 y.o. female with medical history significant for aortic valve sclerosis, diabetes, hypertension, hyperlipidemia, heart failure with preserved EF being admitted to the hospital with acute heart failure exacerbation.  Acute on chronic congestive heart failure with preserved EF-with elevated BNP, mild hypoxia, and mild fluid overload on examination.  Likely due to skipping a dose of Lasix, though I suspect she was already starting to go into heart failure earlier in the week. -Observation admission -Continuous telemetry -IV Lasix daily, with daily potassium supplementation  Acute hypoxic respiratory failure-most likely due to acute exacerbation of heart failure, being treated as above.  As heart failure improves, will wean oxygen as tolerated.  Depression-Lexapro  Hyperlipidemia-Lipitor  Hypertension-continue home Cardizem  Type 2 diabetes-carb modified heart healthy diet, hold glipizide and treat with moderate dose sliding scale in the meantime  DVT prophylaxis: Lovenox     Code Status: Do not attempt resuscitation (DNR) PRE-ARREST INTERVENTIONS DESIRED, this was confirmed with the patient and her daughter at the bedside today at the time of admission.  Consults called:  None  Admission status: Observation  Time spent: 49 minutes  Lamis Behrmann Sharlette Dense MD [facility] Pager [phone]  If 7PM-7AM, please contact night-coverage [email or web address] Password [email or web address]  01/23/2024, 5:00 PM

## 2024-01-24 ENCOUNTER — Observation Stay (HOSPITAL_BASED_OUTPATIENT_CLINIC_OR_DEPARTMENT_OTHER)

## 2024-01-24 DIAGNOSIS — I428 Other cardiomyopathies: Secondary | ICD-10-CM

## 2024-01-24 DIAGNOSIS — S43004A Unspecified dislocation of right shoulder joint, initial encounter: Secondary | ICD-10-CM | POA: Diagnosis not present

## 2024-01-24 DIAGNOSIS — I5031 Acute diastolic (congestive) heart failure: Secondary | ICD-10-CM | POA: Diagnosis not present

## 2024-01-24 DIAGNOSIS — Z743 Need for continuous supervision: Secondary | ICD-10-CM | POA: Diagnosis not present

## 2024-01-24 DIAGNOSIS — Z7401 Bed confinement status: Secondary | ICD-10-CM | POA: Diagnosis not present

## 2024-01-24 DIAGNOSIS — R5383 Other fatigue: Secondary | ICD-10-CM | POA: Diagnosis not present

## 2024-01-24 DIAGNOSIS — M25511 Pain in right shoulder: Secondary | ICD-10-CM | POA: Diagnosis not present

## 2024-01-24 LAB — BASIC METABOLIC PANEL WITH GFR
Anion gap: 7 (ref 5–15)
BUN: 12 mg/dL (ref 8–23)
CO2: 28 mmol/L (ref 22–32)
Calcium: 8.3 mg/dL — ABNORMAL LOW (ref 8.9–10.3)
Chloride: 105 mmol/L (ref 98–111)
Creatinine, Ser: 0.52 mg/dL (ref 0.44–1.00)
GFR, Estimated: >60 mL/min (ref >60)
Glucose, Bld: 115 mg/dL — ABNORMAL HIGH (ref 70–99)
Potassium: 3.1 mmol/L — ABNORMAL LOW (ref 3.5–5.1)
Sodium: 140 mmol/L (ref 135–145)

## 2024-01-24 LAB — ECHOCARDIOGRAM COMPLETE
AR max vel: 0.96 cm2
AV Area VTI: 0.92 cm2
AV Area mean vel: 0.93 cm2
AV Mean grad: 42 mmHg
AV Peak grad: 68.2 mmHg
Ao pk vel: 4.13 m/s
Area-P 1/2: 3.37 cm2
Calc EF: 57.2 %
Height: 65 in
MV VTI: 2.21 cm2
S' Lateral: 3.5 cm
Single Plane A2C EF: 59 %
Single Plane A4C EF: 59.6 %
Weight: 3232 [oz_av]

## 2024-01-24 LAB — CBC
HCT: 33.2 % — ABNORMAL LOW (ref 36.0–46.0)
Hemoglobin: 11 g/dL — ABNORMAL LOW (ref 12.0–15.0)
MCH: 30.7 pg (ref 26.0–34.0)
MCHC: 33.1 g/dL (ref 30.0–36.0)
MCV: 92.7 fL (ref 80.0–100.0)
Platelets: 291 10*3/uL (ref 150–400)
RBC: 3.58 MIL/uL — ABNORMAL LOW (ref 3.87–5.11)
RDW: 13.4 % (ref 11.5–15.5)
WBC: 9.9 10*3/uL (ref 4.0–10.5)
nRBC: 0 % (ref 0.0–0.2)

## 2024-01-24 LAB — GLUCOSE, CAPILLARY
Glucose-Capillary: 100 mg/dL — ABNORMAL HIGH (ref 70–99)
Glucose-Capillary: 140 mg/dL — ABNORMAL HIGH (ref 70–99)
Glucose-Capillary: 167 mg/dL — ABNORMAL HIGH (ref 70–99)
Glucose-Capillary: 96 mg/dL (ref 70–99)

## 2024-01-24 LAB — POTASSIUM: Potassium: 3.4 mmol/L — ABNORMAL LOW (ref 3.5–5.1)

## 2024-01-24 MED ORDER — GUAIFENESIN 100 MG/5ML PO LIQD: 5.0000 mL | ORAL | Status: DC | PRN

## 2024-01-24 MED ORDER — VITAMIN D 25 MCG (1000 UNIT) PO TABS
1000.0000 [IU] | ORAL_TABLET | Freq: Every day | ORAL | Status: DC
  Administered 2024-01-24: 1000 [IU] via ORAL
  Filled 2024-01-24: qty 1

## 2024-01-24 MED ORDER — SENNOSIDES-DOCUSATE SODIUM 8.6-50 MG PO TABS: 1.0000 | ORAL_TABLET | Freq: Every evening | ORAL | Status: DC | PRN

## 2024-01-24 MED ORDER — POTASSIUM CHLORIDE CRYS ER 20 MEQ PO TBCR
40.0000 meq | EXTENDED_RELEASE_TABLET | ORAL | Status: AC
  Administered 2024-01-24 (×2): 40 meq via ORAL
  Filled 2024-01-24 (×2): qty 2

## 2024-01-24 MED ORDER — HYDROCODONE-ACETAMINOPHEN 5-325 MG PO TABS: 1.0000 | ORAL_TABLET | Freq: Two times a day (BID) | ORAL | 0 refills | Status: AC | PRN

## 2024-01-24 MED ORDER — METOPROLOL TARTRATE 5 MG/5ML IV SOLN: 5.0000 mg | INTRAVENOUS | Status: DC | PRN

## 2024-01-24 MED ORDER — HYDRALAZINE HCL 20 MG/ML IJ SOLN: 10.0000 mg | INTRAMUSCULAR | Status: DC | PRN

## 2024-01-24 MED ORDER — TRAZODONE HCL 50 MG PO TABS: 50.0000 mg | ORAL_TABLET | Freq: Every evening | ORAL | Status: DC | PRN

## 2024-01-24 MED ORDER — POTASSIUM CHLORIDE CRYS ER 20 MEQ PO TBCR
40.0000 meq | EXTENDED_RELEASE_TABLET | Freq: Once | ORAL | Status: AC
  Administered 2024-01-24: 40 meq via ORAL
  Filled 2024-01-24: qty 2

## 2024-01-24 NOTE — Plan of Care (Signed)
  Problem: Coping: Goal: Ability to adjust to condition or change in health will improve Outcome: Progressing   Problem: Skin Integrity: Goal: Risk for impaired skin integrity will decrease Outcome: Progressing   Problem: Safety: Goal: Ability to remain free from injury will improve Outcome: Progressing

## 2024-01-24 NOTE — TOC Transition Note (Signed)
 Transition of Care [facility]) - Discharge Note   Patient Details  Name: Patricia Petersen MRN: [id number] Date of Birth: 05/01/1934  Transition of Care [facility]) CM/SW Contact:  Diona Browner, LCSW Phone Number: 01/24/2024, 4:17 PM   Clinical Narrative:    Pt medically ready to d/c back to [facility]. PTAR called at 4:14pm. D/c packet w/ signed DNR left at nurses station.. No further TOC needs.   Final next level of care: Skilled Nursing Facility Barriers to Discharge: Barriers Resolved   Patient Goals and CMS Choice Patient states their goals for this hospitalization and ongoing recovery are:: return to [facility] CMS Medicare.gov Compare Post Acute Care list provided to::  (NA) Choice offered to / list presented to : NA [place] ownership interest in [facility]provided to::  (NA)    Discharge Placement                Patient to be transferred to facility by: PTAR Name of family member notified: trotter,[name] (Daughter)  [phone] (Mobile) Patient and family notified of of transfer: 01/24/24  Discharge Plan and Services Additional resources added to the After Visit Summary for                  DME Arranged: N/A DME Agency: NA       HH Arranged: NA HH Agency: NA        Social Drivers of Health (SDOH) Interventions SDOH Screenings   Food Insecurity: No Food Insecurity (01/23/2024)  Housing: Low Risk  (01/23/2024)  Transportation Needs: No Transportation Needs (01/23/2024)  Utilities: Not At Risk (01/23/2024)  Social Connections: Moderately Integrated (01/23/2024)  Tobacco Use: Low Risk  (01/23/2024)     Readmission Risk Interventions     No data to display

## 2024-01-24 NOTE — Hospital Course (Addendum)
 Brief Narrative:  89 year old with history of aortic valve sclerosis, DM2, HTN, HLD, CHF with preserved EF admitted to the hospital for acute CHF exacerbation.  Patient lives at assisted living [facility] and is bedbound at baseline.  Apparently she missed a dose of Lasix and also during moving care she had a left shoulder therefore came to the ER.  Based on information provided, shoulder was reduced at her facility.  X-ray here did not reveal any fractures or dislocation. Upon admission started on diuretics and also requiring supplements for electrolyte repletion. Echo showed preserved ef, she wa feeling better tha following day.   Stable for dc today   Assessment & Plan:  Principal Problem:   Acute diastolic (congestive) heart failure (HCC)     Acute on chronic congestive heart failure with preserved EF Acute mild hypoxia -Echocardiogram showing preserved EF of 65% 2016.  Some signs of volume overload which has resolved with IV lasix, Repeat echo shows preserved ef.  -Chest x-ray showing mild pleural effusion and pulmonary edema.  Concerns of calcifications of the left humerus/glenoid.  This can be followed up outpatient  Hypokalemia - As needed repletion.   Right shoulder pain -No fracture or dislocation noted on the x-ray.  Probable chronic rotator cuff injury   Depression -Lexapro   Hyperlipidemia -Lipitor   Hypertension -continue home Cardizem   Type 2 diabetes - Home p.o. medication glipizide is on hold - Sliding scale and Accu-Chek.    DVT prophylaxis: enoxaparin (LOVENOX) injection 40 mg Start: 01/23/24 2200    Code Status: Do not attempt resuscitation (DNR) PRE-ARREST INTERVENTIONS DESIRED Family Communication:  Called daughter DC today    Subjective:  Doing ok, no complaints.  Wants to return back, spoke with her daughter as well  Examination:  General exam: Appears calm and comfortable  Respiratory system: Clear to auscultation. Respiratory effort  normal. Cardiovascular system: S1 & S2 heard, RRR. No JVD, murmurs, rubs, gallops or clicks. No pedal edema. Gastrointestinal system: Abdomen is nondistended, soft and nontender. No organomegaly or masses felt. Normal bowel sounds heard. Central nervous system: Alert and oriented. No focal neurological deficits. Extremities: Symmetric 5 x 5 power. Skin: No rashes, lesions or ulcers Psychiatry: Judgement and insight appear normal. Mood & affect appropriate.

## 2024-01-24 NOTE — Progress Notes (Signed)
 PROGRESS NOTE    Patricia Petersen  QQV:[id number] DOB: 04-15-1934 DOA: 01/23/2024 PCP: Mahlon Gammon, MD    Brief Narrative:  89 year old with history of aortic valve sclerosis, DM2, HTN, HLD, CHF with preserved EF admitted to the hospital for acute CHF exacerbation.  Patient lives at [facility] and is bedbound at baseline.  Apparently she missed a dose of Lasix and also during moving care she had a left shoulder therefore came to the ER.  Based on information provided, shoulder was reduced at her facility.  X-ray here did not reveal any fractures or dislocation. Upon admission started on diuretics and also requiring supplements for electrolyte repletion.   Assessment & Plan:  Principal Problem:   Acute diastolic (congestive) heart failure (HCC)     Acute on chronic congestive heart failure with preserved EF Acute mild hypoxia -Echocardiogram showing preserved EF of 65% 2016.  Some signs of volume overload therefore received IV Lasix along with supplements.  Wean off oxygen as appropriate -Update echocardiogram -Chest x-ray showing mild pleural effusion and pulmonary edema.  Concerns of calcifications of the left humerus/glenoid.  This can be followed up outpatient  Hypokalemia - As needed repletion.  Check magnesium and phosphorus as well  Right shoulder pain -No fracture or dislocation noted on the x-ray.  Probable chronic rotator cuff injury   Depression -Lexapro   Hyperlipidemia -Lipitor   Hypertension -continue home Cardizem   Type 2 diabetes - Home p.o. medication glipizide is on hold - Sliding scale and Accu-Chek.    DVT prophylaxis: enoxaparin (LOVENOX) injection 40 mg Start: 01/23/24 2200    Code Status: Do not attempt resuscitation (DNR) PRE-ARREST INTERVENTIONS DESIRED Family Communication:  Called daughter On going management for chf.     Subjective:  Doing ok, no complaints.  Reports being bound.   Examination:  General exam:  Appears calm and comfortable  Respiratory system: Clear to auscultation. Respiratory effort normal. Cardiovascular system: S1 & S2 heard, RRR. No JVD, murmurs, rubs, gallops or clicks. No pedal edema. Gastrointestinal system: Abdomen is nondistended, soft and nontender. No organomegaly or masses felt. Normal bowel sounds heard. Central nervous system: Alert and oriented. No focal neurological deficits. Extremities: Symmetric 5 x 5 power. Skin: No rashes, lesions or ulcers Psychiatry: Judgement and insight appear normal. Mood & affect appropriate.                Diet Orders (From admission, onward)     Start     Ordered   01/23/24 1640  Diet heart healthy/carb modified Room service appropriate? Yes; Fluid consistency: Thin  Diet effective now       Question Answer Comment  Diet-HS Snack? Nothing   Room service appropriate? Yes   Fluid consistency: Thin      01/23/24 1640            Objective: Vitals:   01/23/24 1830 01/23/24 1957 01/23/24 2357 01/24/24 0405  BP: 121/63 138/82 137/60 133/63  Pulse: 75 75 84 74  Resp: (!) 21 20 20 16   Temp:  98.3 F (36.8 C) 98.6 F (37 C) 98.4 F (36.9 C)  TempSrc:  Oral Oral Oral  SpO2: 94% 92% 92% 97%  Weight:      Height:        Intake/Output Summary (Last 24 hours) at 01/24/2024 1051 Last data filed at 01/24/2024 0900 Gross per 24 hour  Intake 600 ml  Output 2200 ml  Net -1600 ml   Filed Weights   01/23/24  1102  Weight: 91.6 kg    Scheduled Meds:  atorvastatin  20 mg Oral Daily   cholecalciferol  1,000 Units Oral Daily   diltiazem  180 mg Oral Daily   enoxaparin (LOVENOX) injection  40 mg Subcutaneous Q24H   escitalopram  10 mg Oral Daily   furosemide  40 mg Intravenous Daily   insulin aspart  0-15 Units Subcutaneous TID WC   insulin aspart  0-5 Units Subcutaneous QHS   multivitamin with minerals  1 tablet Oral Daily   potassium chloride  40 mEq Oral Q4H   Continuous Infusions:  Nutritional status      Body mass index is 33.61 kg/m.  Data Reviewed:   CBC: Recent Labs  Lab 01/23/24 1247 01/24/24 0333  WBC 9.7 9.9  NEUTROABS 5.6  --   HGB 11.7* 11.0*  HCT 35.5* 33.2*  MCV 92.7 92.7  PLT 291 291   Basic Metabolic Panel: Recent Labs  Lab 01/23/24 1247 01/24/24 0333  NA 140 140  K 4.0 3.1*  CL 107 105  CO2 27 28  GLUCOSE 129* 115*  BUN 13 12  CREATININE 0.43* 0.52  CALCIUM 8.8* 8.3*   GFR: Estimated Creatinine Clearance: 55.4 mL/min (by C-G formula based on SCr of 0.52 mg/dL). Liver Function Tests: Recent Labs  Lab 01/23/24 1247  AST 30  ALT 15  ALKPHOS 105  BILITOT 1.7*  PROT 5.8*  ALBUMIN 2.7*   No results for input(s): "LIPASE", "AMYLASE" in the last 168 hours. No results for input(s): "AMMONIA" in the last 168 hours. Coagulation Profile: No results for input(s): "INR", "PROTIME" in the last 168 hours. Cardiac Enzymes: No results for input(s): "CKTOTAL", "CKMB", "CKMBINDEX", "TROPONINI" in the last 168 hours. BNP (last 3 results) No results for input(s): "PROBNP" in the last 8760 hours. HbA1C: No results for input(s): "HGBA1C" in the last 72 hours. CBG: Recent Labs  Lab 01/23/24 1643 01/23/24 2039 01/24/24 0740  GLUCAP 132* 155* 100*   Lipid Profile: No results for input(s): "CHOL", "HDL", "LDLCALC", "TRIG", "CHOLHDL", "LDLDIRECT" in the last 72 hours. Thyroid Function Tests: No results for input(s): "TSH", "T4TOTAL", "FREET4", "T3FREE", "THYROIDAB" in the last 72 hours. Anemia Panel: No results for input(s): "VITAMINB12", "FOLATE", "FERRITIN", "TIBC", "IRON", "RETICCTPCT" in the last 72 hours. Sepsis Labs: Recent Labs  Lab 01/23/24 1305  LATICACIDVEN 1.1    No results found for this or any previous visit (from the past 240 hours).       Radiology Studies: DG Chest Portable 1 View Result Date: 01/23/2024 CLINICAL DATA:  low oxygen EXAM: PORTABLE CHEST 1 VIEW COMPARISON:  January 24, 2006 FINDINGS: The cardiomediastinal silhouette is  enlarged in contour. Small bilateral pleural effusions. No pneumothorax. Peribronchial cuffing with diffuse interstitial prominence. Several calcific densities project along the contours of the LEFT humerus and glenoid, incompletely assessed IMPRESSION: 1. Constellation of findings are favored to reflect pulmonary edema with small bilateral pleural effusions. 2. Several calcific densities project along the contours of the LEFT humerus and glenoid, incompletely assessed. Recommend dedicated radiographs of the LEFT shoulder. Electronically Signed   By: Meda Klinefelter M.D.   On: 01/23/2024 13:35   DG Shoulder Right Result Date: 01/23/2024 CLINICAL DATA:  Possible right shoulder dislocation. EXAM: RIGHT SHOULDER - 2+ VIEW COMPARISON:  None Available. FINDINGS: There is no evidence of fracture or dislocation. Moderate narrowing of subacromial space is noted. Soft tissues are unremarkable. IMPRESSION: No fracture or dislocation is noted. Moderate narrowing of subacromial space is noted suggesting chronic rotator cuff  injury. Electronically Signed   By: Lupita Raider M.D.   On: 01/23/2024 11:53           LOS: 0 days   Time spent= 35 mins    Miguel Rota, MD [facility]  If 7PM-7AM, please contact night-coverage  01/24/2024, 10:51 AM

## 2024-01-24 NOTE — [place] FL2 (Signed)
 [place] MEDICAID FL2 LEVEL OF CARE FORM     IDENTIFICATION  Patient Name: Patricia Petersen Birthdate: 1933/12/30 Sex: female Admission Date (Current Location): 01/23/2024  [facility] and [place] Number:  Producer, television/film/video and Address:  [facility],  [address], [place] [address]      Provider Number: [id number]  Attending Physician Name and Address:  Miguel Rota, MD  Relative Name and Phone Number:  trotter,[name] (Daughter)  [phone] [facility])    Current Level of Care: Hospital Recommended Level of Care: Skilled Nursing Facility Prior Approval Number:    Date Approved/Denied:   PASRR Number:    Discharge Plan: SNF    Current Diagnoses: Patient Active Problem List   Diagnosis Date Noted   Acute diastolic (congestive) heart failure (HCC) 01/23/2024   Aortic valve sclerosis 12/09/2021   Hyperglycemia due to diabetes mellitus (HCC) 12/09/2021   Type 2 diabetes mellitus with mild nonproliferative diabetic retinopathy without macular edema, bilateral (HCC) 12/09/2021   Body mass index (BMI) 37.0-37.9, adult 03/20/2021   Essential hypertension 03/20/2021   Hypercholesterolemia 03/20/2021   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 with type 2 diabetes mellitus (HCC) 03/20/2021   Morbid obesity (HCC) 03/20/2021   Peripheral venous insufficiency 03/20/2021   Polyneuropathy due to type 2 diabetes mellitus (HCC) 03/20/2021   Postmenopausal bleeding 03/20/2021    Orientation RESPIRATION BLADDER Height & Weight     Self, Time, Situation, Place  Normal Continent Weight: 202 lb (91.6 kg) Height:  5\' 5"  (165.1 cm)  BEHAVIORAL SYMPTOMS/MOOD NEUROLOGICAL BOWEL NUTRITION STATUS      Continent Diet (Heart healthy)  AMBULATORY STATUS COMMUNICATION OF NEEDS Skin   Extensive Assist Verbally Normal                       Personal Care Assistance Level of Assistance  Bathing, Feeding, Dressing Bathing  Assistance: Limited assistance Feeding assistance: Limited assistance Dressing Assistance: Limited assistance     Functional Limitations Info  Sight, Hearing, Speech Sight Info: Impaired Hearing Info: Adequate Speech Info: Adequate    SPECIAL CARE FACTORS FREQUENCY                       Contractures Contractures Info: Not present    Additional Factors Info  Code Status, Allergies Code Status Info: DNR Allergies Info: Penicillin G  Arthro-complex (Nutritional Supplements)  Empagliflozin  Levaquin (Levofloxacin In D5w)  Levofloxacin  Lipitor (Atorvastatin)  Metformin Hcl  Terbinafine           Current Medications (01/24/2024):  This is the current hospital active medication list Current Facility-Administered Medications  Medication Dose Route Frequency Provider Last Rate Last Admin   acetaminophen (TYLENOL) tablet 650 mg  650 mg Oral Q6H PRN Kirby Crigler, Mir M, MD       Or   acetaminophen (TYLENOL) suppository 650 mg  650 mg Rectal Q6H PRN Kirby Crigler, Mir M, MD       albuterol (PROVENTIL) (2.5 MG/3ML) 0.083% nebulizer solution 2.5 mg  2.5 mg Nebulization Q2H PRN Kirby Crigler, Mir M, MD       atorvastatin (LIPITOR) tablet 20 mg  20 mg Oral Daily Kirby Crigler, Mir M, MD   20 mg at 01/24/24 1205   cholecalciferol (VITAMIN D3) 25 MCG (1000 UNIT) tablet 1,000 Units  1,000 Units Oral Daily Amin, Ankit C, MD   1,000 Units at 01/24/24 1205   diltiazem (CARDIZEM CD) 24 hr capsule 180 mg  180 mg Oral Daily Kirby Crigler, Mir M, MD   180 mg at 01/24/24 1204   enoxaparin (LOVENOX) injection 40 mg  40 mg Subcutaneous Q24H Kirby Crigler, Mir M, MD   40 mg at 01/23/24 2208   escitalopram (LEXAPRO) tablet 10 mg  10 mg Oral Daily Kirby Crigler, Mir M, MD   10 mg at 01/24/24 1206   furosemide (LASIX) injection 40 mg  40 mg Intravenous Daily Kirby Crigler, Mir M, MD   40 mg at 01/24/24 1208   guaiFENesin (ROBITUSSIN) 100 MG/5ML liquid 5 mL  5 mL Oral Q4H PRN Amin, Ankit C, MD       hydrALAZINE (APRESOLINE)  injection 10 mg  10 mg Intravenous Q4H PRN Amin, Ankit C, MD       HYDROcodone-acetaminophen (NORCO/VICODIN) 5-325 MG per tablet 1 tablet  1 tablet Oral BID PRN Kirby Crigler, Mir M, MD       hydrOXYzine (ATARAX) tablet 10 mg  10 mg Oral PRN Kirby Crigler, Mir M, MD       insulin aspart (novoLOG) injection 0-15 Units  0-15 Units Subcutaneous TID WC Kirby Crigler, Mir M, MD   2 Units at 01/24/24 1230   insulin aspart (novoLOG) injection 0-5 Units  0-5 Units Subcutaneous QHS Kirby Crigler, Mir M, MD       metoprolol tartrate (LOPRESSOR) injection 5 mg  5 mg Intravenous Q4H PRN Amin, Ankit C, MD       multivitamin with minerals tablet 1 tablet  1 tablet Oral Daily Kirby Crigler, Mir M, MD   1 tablet at 01/24/24 1204   ondansetron (ZOFRAN) tablet 4 mg  4 mg Oral Q6H PRN Kirby Crigler, Mir M, MD       Or   ondansetron [facility]) injection 4 mg  4 mg Intravenous Q6H PRN Kirby Crigler, Mir M, MD       potassium chloride SA (KLOR-CON M) CR tablet 40 mEq  40 mEq Oral Q4H Amin, Ankit C, MD   40 mEq at 01/24/24 1207   senna-docusate (Senokot-S) tablet 1 tablet  1 tablet Oral QHS PRN Amin, Ankit C, MD       traZODone (DESYREL) tablet 50 mg  50 mg Oral QHS PRN Amin, Ankit C, MD         Discharge Medications: Please see discharge summary for a list of discharge medications.  Relevant Imaging Results:  Relevant Lab Results:   Additional Information SSN [id number]  Diona Browner, LCSW

## 2024-01-24 NOTE — Care Management Obs Status (Signed)
 MEDICARE OBSERVATION STATUS NOTIFICATION   Patient Details  Name: Patricia Petersen MRN: [id number] Date of Birth: Oct 08, 1934   Medicare Observation Status Notification Given:  Yes    Diona Browner, LCSW 01/24/2024, 2:03 PM

## 2024-01-24 NOTE — Discharge Summary (Signed)
 Physician Discharge Summary  Patricia Petersen:[id number] DOB: 20-Apr-1934 DOA: 01/23/2024  PCP: Mahlon Gammon, MD  Admit date: 01/23/2024 Discharge date: 01/24/2024  Admitted From: ALF Disposition:  ALF  Recommendations for Outpatient Follow-up:  Follow up with PCP in 1-2 weeks Please obtain BMP/CBC in one week your next doctors visit.     Discharge Condition: Stable CODE STATUS: DNR Diet recommendation: Diabetic  Brief/Interim Summary: Brief Narrative:  89 year old with history of aortic valve sclerosis, DM2, HTN, HLD, CHF with preserved EF admitted to the hospital for acute CHF exacerbation.  Patient lives at [facility] and is bedbound at baseline.  Apparently she missed a dose of Lasix and also during moving care she had a left shoulder therefore came to the ER.  Based on information provided, shoulder was reduced at her facility.  X-ray here did not reveal any fractures or dislocation. Upon admission started on diuretics and also requiring supplements for electrolyte repletion. Echo showed preserved ef, she wa feeling better tha following day.   Stable for dc today   Assessment & Plan:  Principal Problem:   Acute diastolic (congestive) heart failure (HCC)     Acute on chronic congestive heart failure with preserved EF Acute mild hypoxia -Echocardiogram showing preserved EF of 65% 2016.  Some signs of volume overload which has resolved with IV lasix, Repeat echo shows preserved ef.  -Chest x-ray showing mild pleural effusion and pulmonary edema.  Concerns of calcifications of the left humerus/glenoid.  This can be followed up outpatient  Hypokalemia - As needed repletion.   Right shoulder pain -No fracture or dislocation noted on the x-ray.  Probable chronic rotator cuff injury   Depression -Lexapro   Hyperlipidemia -Lipitor   Hypertension -continue home Cardizem   Type 2 diabetes - Home p.o. medication glipizide is on hold - Sliding scale  and Accu-Chek.    DVT prophylaxis: enoxaparin (LOVENOX) injection 40 mg Start: 01/23/24 2200    Code Status: Do not attempt resuscitation (DNR) PRE-ARREST INTERVENTIONS DESIRED Family Communication:  Called daughter DC today    Subjective:  Doing ok, no complaints.  Wants to return back, spoke with her daughter as well  Examination:  General exam: Appears calm and comfortable  Respiratory system: Clear to auscultation. Respiratory effort normal. Cardiovascular system: S1 & S2 heard, RRR. No JVD, murmurs, rubs, gallops or clicks. No pedal edema. Gastrointestinal system: Abdomen is nondistended, soft and nontender. No organomegaly or masses felt. Normal bowel sounds heard. Central nervous system: Alert and oriented. No focal neurological deficits. Extremities: Symmetric 5 x 5 power. Skin: No rashes, lesions or ulcers Psychiatry: Judgement and insight appear normal. Mood & affect appropriate.    Discharge Diagnoses:  Principal Problem:   Acute diastolic (congestive) heart failure [facility])      Discharge Exam: Vitals:   01/24/24 1351 01/24/24 1508  BP:    Pulse:  77  Resp:    Temp:    SpO2: 96% 93%   Vitals:   01/24/24 0405 01/24/24 1319 01/24/24 1351 01/24/24 1508  BP: 133/63 (!) 125/58    Pulse: 74 79  77  Resp: 16     Temp: 98.4 F (36.9 C) 98.5 F (36.9 C)    TempSrc: Oral Oral    SpO2: 97% 96% 96% 93%  Weight:      Height:          Discharge Instructions   Allergies as of 01/24/2024       Reactions   Penicillin G Anaphylaxis  Throat closing up   Arthro-complex [nutritional Supplements] Other (See Comments)   Allergy not listed on MAR    Empagliflozin Other (See Comments)   Yeast infection   Levaquin [levofloxacin In D5w] Other (See Comments)   Reaction not listed on MAR    Levofloxacin Other (See Comments)   Reaction not listed on MAR    Lipitor [atorvastatin] Other (See Comments)   Allergy not listed on MAR    Metformin Hcl Other (See  Comments)   Stomach cramps   Terbinafine Other (See Comments)   Reaction not listed on MAR        Medication List     TAKE these medications    acetaminophen 500 MG tablet Commonly known as: TYLENOL Take 1,000 mg by mouth 3 (three) times daily as needed (reason not listed on MAR).   atorvastatin 20 MG tablet Commonly known as: LIPITOR Take 20 mg by mouth daily.   cholecalciferol 1000 units tablet Commonly known as: VITAMIN D Take 1,000 Units by mouth daily.   diclofenac Sodium 1 % Gel Commonly known as: Voltaren Arthritis Pain Apply 2 g topically in the morning, at noon, and at bedtime for 14 days. What changed: additional instructions   diltiazem 180 MG 24 hr capsule Commonly known as: Cardizem CD Take 1 capsule (180 mg total) by mouth daily. What changed: additional instructions   escitalopram 10 MG tablet Commonly known as: LEXAPRO Take 10 mg by mouth daily.   furosemide 40 MG tablet Commonly known as: LASIX Take 40 mg by mouth 3 (three) times a week. Monday, Tuesday, and Thursday   glipiZIDE 2.5 MG 24 hr tablet Commonly known as: GLUCOTROL XL Take 2.5 mg by mouth every morning.   HYDROcodone-acetaminophen 5-325 MG tablet Commonly known as: NORCO/VICODIN Take 1 tablet by mouth 2 (two) times daily as needed for up to 5 days for moderate pain (pain score 4-6) or severe pain (pain score 7-10). What changed: reasons to take this   hydrOXYzine 10 MG tablet Commonly known as: ATARAX Take 1 tablet (10 mg total) by mouth daily as needed. What changed:  when to take this reasons to take this   meloxicam 7.5 MG tablet Commonly known as: MOBIC Take 7.5 mg by mouth daily as needed for pain.   MULTIVITAMIN ADULT PO Take 1 tablet by mouth daily.   Ozempic (0.25 or 0.5 MG/DOSE) 2 MG/3ML Sopn Generic drug: Semaglutide(0.25 or 0.5MG /DOS) Inject 0.5 mg into the skin once a week.   potassium chloride SA 20 MEQ tablet Commonly known as: KLOR-CON M Take 20 mEq by  mouth every Monday, Wednesday, and Friday.        Follow-up Information     Mahlon Gammon, MD Follow up in 1 week(s).   Specialty: Internal Medicine Contact information: [address] [place] [address] [phone]                Allergies  Allergen Reactions   Penicillin G Anaphylaxis    Throat closing up   Arthro-Complex [Nutritional Supplements] Other (See Comments)    Allergy not listed on MAR    Empagliflozin Other (See Comments)    Yeast infection   Levaquin [Levofloxacin In D5w] Other (See Comments)    Reaction not listed on MAR    Levofloxacin Other (See Comments)    Reaction not listed on MAR    Lipitor [Atorvastatin] Other (See Comments)    Allergy not listed on MAR    Metformin Hcl Other (See Comments)  Stomach cramps   Terbinafine Other (See Comments)    Reaction not listed on MAR    You were cared for by a hospitalist during your hospital stay. If you have any questions about your discharge medications or the care you received while you were in the hospital after you are discharged, you can call the unit and asked to speak with the hospitalist on call if the hospitalist that took care of you is not available. Once you are discharged, your primary care physician will handle any further medical issues. Please note that no refills for any discharge medications will be authorized once you are discharged, as it is imperative that you return to your primary care physician (or establish a relationship with a primary care physician if you do not have one) for your aftercare needs so that they can reassess your need for medications and monitor your lab values.  You were cared for by a hospitalist during your hospital stay. If you have any questions about your discharge medications or the care you received while you were in the hospital after you are discharged, you can call the unit and asked to speak with the hospitalist on call if the hospitalist that  took care of you is not available. Once you are discharged, your primary care physician will handle any further medical issues. Please note that NO REFILLS for any discharge medications will be authorized once you are discharged, as it is imperative that you return to your primary care physician (or establish a relationship with a primary care physician if you do not have one) for your aftercare needs so that they can reassess your need for medications and monitor your lab values.  Please request your Prim.MD to go over all Hospital Tests and Procedure/Radiological results at the follow up, please get all Hospital records sent to your Prim MD by signing hospital release before you go home.  Get CBC, CMP, 2 view Chest X ray checked  by Primary MD during your next visit or SNF MD in 5-7 days ( we routinely change or add medications that can affect your baseline labs and fluid status, therefore we recommend that you get the mentioned basic workup next visit with your PCP, your PCP may decide not to get them or add new tests based on their clinical decision)  On your next visit with your primary care physician please Get Medicines reviewed and adjusted.  If you experience worsening of your admission symptoms, develop shortness of breath, life threatening emergency, suicidal or homicidal thoughts you must seek medical attention immediately by calling 911 or calling your MD immediately  if symptoms less severe.  You Must read complete instructions/literature along with all the possible adverse reactions/side effects for all the Medicines you take and that have been prescribed to you. Take any new Medicines after you have completely understood and accpet all the possible adverse reactions/side effects.   Do not drive, operate heavy machinery, perform activities at heights, swimming or participation in water activities or provide baby sitting services if your were admitted for syncope or siezures until you have  seen by Primary MD or a Neurologist and advised to do so again.  Do not drive when taking Pain medications.   Procedures/Studies: ECHOCARDIOGRAM COMPLETE Result Date: 01/24/2024    ECHOCARDIOGRAM REPORT   Patient Name:   Patricia Petersen Date of Exam: 01/24/2024 Medical Rec #:  [id number]       Height:       65.0 in  Accession #:    [id number]      Weight:       202.0 lb Date of Birth:  Jul 31, 1934       BSA:          1.987 m Patient Age:    89 years        BP:           133/63 mmHg Patient Gender: F               HR:           68 bpm. Exam Location:  Inpatient Procedure: 2D Echo, Cardiac Doppler and Color Doppler (Both Spectral and Color            Flow Doppler were utilized during procedure). Indications:    Cardiomyopathy  History:        Patient has prior history of Echocardiogram examinations, most                 recent 09/05/2015. CHF, PAD; Risk Factors:Hypertension and                 Diabetes.  Sonographer:    Amy Chionchio Referring Phys: [id number] Doreen Salvage Deshauna Cayson IMPRESSIONS  1. Left ventricular ejection fraction, by estimation, is 60 to 65%. The left ventricle has normal function. The left ventricle has no regional wall motion abnormalities. There is mild concentric left ventricular hypertrophy. Left ventricular diastolic parameters are consistent with Grade II diastolic dysfunction (pseudonormalization).  2. Right ventricular systolic function is hyperdynamic. The right ventricular size is normal. There is moderately elevated pulmonary artery systolic pressure.  3. Left atrial size was moderately dilated.  4. The mitral valve is grossly normal. Trivial mitral valve regurgitation. The mean mitral valve gradient is 3.0 mmHg.  5. Aortic valve calcification . The aortic valve is calcified. Aortic valve regurgitation is not visualized. Severe aortic valve stenosis. Aortic valve area, by VTI measures 0.92 cm. Aortic valve mean gradient measures 42.0 mmHg. Aortic valve Vmax measures 4.13 m/s. Aortic valve  acceleration time measures 120 msec.  6. The inferior vena cava is dilated in size with <50% respiratory variability, suggesting right atrial pressure of 15 mmHg. Comparison(s): Prior images unable to be directly viewed, comparison made by report only. In comparison from 2016 report- aortic sclerosis has progressed to severe aortic stenosis. FINDINGS  Left Ventricle: No ventricular strain or 3D. Left ventricular ejection fraction, by estimation, is 60 to 65%. The left ventricle has normal function. The left ventricle has no regional wall motion abnormalities. Strain was performed and the global longitudinal strain is indeterminate. The left ventricular internal cavity size was normal in size. There is mild concentric left ventricular hypertrophy. Left ventricular diastolic parameters are consistent with Grade II diastolic dysfunction (pseudonormalization). Right Ventricle: The right ventricular size is normal. No increase in right ventricular wall thickness. Right ventricular systolic function is hyperdynamic. There is moderately elevated pulmonary artery systolic pressure. The tricuspid regurgitant velocity is 2.93 m/s, and with an assumed right atrial pressure of 15 mmHg, the estimated right ventricular systolic pressure is 49.3 mmHg. Left Atrium: Left atrial size was moderately dilated. Right Atrium: Right atrial size was normal in size. Pericardium: Trivial pericardial effusion is present. The pericardial effusion is anterior to the right ventricle. Mitral Valve: The mitral valve is grossly normal. Trivial mitral valve regurgitation. MV peak gradient, 6.7 mmHg. The mean mitral valve gradient is 3.0 mmHg. Tricuspid Valve: The tricuspid valve is normal in structure. Tricuspid valve regurgitation is  not demonstrated. No evidence of tricuspid stenosis. Aortic Valve: Aortic valve calcification. The aortic valve is calcified. Aortic valve regurgitation is not visualized. Severe aortic stenosis is present. Aortic valve  mean gradient measures 42.0 mmHg. Aortic valve peak gradient measures 68.2 mmHg. Aortic  valve area, by VTI measures 0.92 cm. Pulmonic Valve: The pulmonic valve was grossly normal. Pulmonic valve regurgitation is not visualized. No evidence of pulmonic stenosis. Aorta: The aortic root and ascending aorta are structurally normal, with no evidence of dilitation. Venous: The inferior vena cava is dilated in size with less than 50% respiratory variability, suggesting right atrial pressure of 15 mmHg. IAS/Shunts: The atrial septum is grossly normal. Additional Comments: 3D was performed not requiring image post processing on an independent workstation and was indeterminate.  LEFT VENTRICLE PLAX 2D LVIDd:         4.40 cm      Diastology LVIDs:         3.50 cm      LV e' lateral:   7.18 cm/s LV PW:         1.10 cm      LV E/e' lateral: 15.7 LV IVS:        1.20 cm LVOT diam:     2.00 cm LV SV:         92 LV SV Index:   46 LVOT Area:     3.14 cm  LV Volumes (MOD) LV vol d, MOD A2C: 106.0 ml LV vol d, MOD A4C: 102.0 ml LV vol s, MOD A2C: 43.5 ml LV vol s, MOD A4C: 41.2 ml LV SV MOD A2C:     62.5 ml LV SV MOD A4C:     102.0 ml LV SV MOD BP:      59.7 ml RIGHT VENTRICLE          IVC RV Basal diam:  3.00 cm  IVC diam: 2.70 cm RV Mid diam:    3.00 cm TAPSE (M-mode): 2.5 cm LEFT ATRIUM             Index        RIGHT ATRIUM           Index LA Vol (A2C):   77.7 ml 39.11 ml/m  RA Area:     18.00 cm LA Vol (A4C):   87.5 ml 44.05 ml/m  RA Volume:   47.00 ml  23.66 ml/m LA Biplane Vol: 86.3 ml 43.44 ml/m  AORTIC VALVE AV Area (Vmax):    0.96 cm AV Area (Vmean):   0.93 cm AV Area (VTI):     0.92 cm AV Vmax:           413.00 cm/s AV Vmean:          298.750 cm/s AV VTI:            0.992 m AV Peak Grad:      68.2 mmHg AV Mean Grad:      42.0 mmHg LVOT Vmax:         126.00 cm/s LVOT Vmean:        88.700 cm/s LVOT VTI:          0.292 m LVOT/AV VTI ratio: 0.29  AORTA Ao Root diam: 2.60 cm Ao Asc diam:  3.20 cm MITRAL VALVE                 TRICUSPID VALVE MV Area (PHT): 3.37 cm     TR Peak grad:   34.3 mmHg MV Area VTI:  2.21 cm     TR Vmax:        293.00 cm/s MV Peak grad:  6.7 mmHg MV Mean grad:  3.0 mmHg     SHUNTS MV Vmax:       1.29 m/s     Systemic VTI:  0.29 m MV Vmean:      86.8 cm/s    Systemic Diam: 2.00 cm MV Decel Time: 225 msec MV E velocity: 113.00 cm/s MV A velocity: 93.00 cm/s MV E/A ratio:  1.22 Riley Lam MD Electronically signed by Riley Lam MD Signature Date/Time: 01/24/2024/12:23:32 PM    Final    DG Chest Portable 1 View Result Date: 01/23/2024 CLINICAL DATA:  low oxygen EXAM: PORTABLE CHEST 1 VIEW COMPARISON:  January 24, 2006 FINDINGS: The cardiomediastinal silhouette is enlarged in contour. Small bilateral pleural effusions. No pneumothorax. Peribronchial cuffing with diffuse interstitial prominence. Several calcific densities project along the contours of the LEFT humerus and glenoid, incompletely assessed IMPRESSION: 1. Constellation of findings are favored to reflect pulmonary edema with small bilateral pleural effusions. 2. Several calcific densities project along the contours of the LEFT humerus and glenoid, incompletely assessed. Recommend dedicated radiographs of the LEFT shoulder. Electronically Signed   By: Meda Klinefelter M.D.   On: 01/23/2024 13:35   DG Shoulder Right Result Date: 01/23/2024 CLINICAL DATA:  Possible right shoulder dislocation. EXAM: RIGHT SHOULDER - 2+ VIEW COMPARISON:  None Available. FINDINGS: There is no evidence of fracture or dislocation. Moderate narrowing of subacromial space is noted. Soft tissues are unremarkable. IMPRESSION: No fracture or dislocation is noted. Moderate narrowing of subacromial space is noted suggesting chronic rotator cuff injury. Electronically Signed   By: Lupita Raider M.D.   On: 01/23/2024 11:53     The results of significant diagnostics from this hospitalization (including imaging, microbiology, ancillary and laboratory) are  listed below for reference.     Microbiology: No results found for this or any previous visit (from the past 240 hours).   Labs: BNP (last 3 results) Recent Labs    01/23/24 1247  BNP 134.5*   Basic Metabolic Panel: Recent Labs  Lab 01/23/24 1247 01/24/24 0333 01/24/24 1431  NA 140 140  --   K 4.0 3.1* 3.4*  CL 107 105  --   CO2 27 28  --   GLUCOSE 129* 115*  --   BUN 13 12  --   CREATININE 0.43* 0.52  --   CALCIUM 8.8* 8.3*  --    Liver Function Tests: Recent Labs  Lab 01/23/24 1247  AST 30  ALT 15  ALKPHOS 105  BILITOT 1.7*  PROT 5.8*  ALBUMIN 2.7*   No results for input(s): "LIPASE", "AMYLASE" in the last 168 hours. No results for input(s): "AMMONIA" in the last 168 hours. CBC: Recent Labs  Lab 01/23/24 1247 01/24/24 0333  WBC 9.7 9.9  NEUTROABS 5.6  --   HGB 11.7* 11.0*  HCT 35.5* 33.2*  MCV 92.7 92.7  PLT 291 291   Cardiac Enzymes: No results for input(s): "CKTOTAL", "CKMB", "CKMBINDEX", "TROPONINI" in the last 168 hours. BNP: Invalid input(s): "POCBNP" CBG: Recent Labs  Lab 01/23/24 1643 01/23/24 2039 01/24/24 0740 01/24/24 1140  GLUCAP 132* 155* 100* 140*   D-Dimer No results for input(s): "DDIMER" in the last 72 hours. Hgb A1c No results for input(s): "HGBA1C" in the last 72 hours. Lipid Profile No results for input(s): "CHOL", "HDL", "LDLCALC", "TRIG", "CHOLHDL", "LDLDIRECT" in the last 72 hours. Thyroid function studies  No results for input(s): "TSH", "T4TOTAL", "T3FREE", "THYROIDAB" in the last 72 hours.  Invalid input(s): "FREET3" Anemia work up No results for input(s): "VITAMINB12", "FOLATE", "FERRITIN", "TIBC", "IRON", "RETICCTPCT" in the last 72 hours. Urinalysis    Component Value Date/Time   COLORURINE AMBER (A) 01/23/2024 1529   APPEARANCEUR HAZY (A) 01/23/2024 1529   LABSPEC 1.019 01/23/2024 1529   PHURINE 5.0 01/23/2024 1529   GLUCOSEU NEGATIVE 01/23/2024 1529   HGBUR NEGATIVE 01/23/2024 1529   BILIRUBINUR  NEGATIVE 01/23/2024 1529   KETONESUR NEGATIVE 01/23/2024 1529   PROTEINUR NEGATIVE 01/23/2024 1529   NITRITE NEGATIVE 01/23/2024 1529   LEUKOCYTESUR NEGATIVE 01/23/2024 1529   Sepsis Labs Recent Labs  Lab 01/23/24 1247 01/24/24 0333  WBC 9.7 9.9   Microbiology No results found for this or any previous visit (from the past 240 hours).   Time coordinating discharge:  I have spent 35 minutes face to face with the patient and on the ward discussing the patients care, assessment, plan and disposition with other care givers. >50% of the time was devoted counseling the patient about the risks and benefits of treatment/Discharge disposition and coordinating care.   SIGNED:   Miguel Rota, MD  [facility] 01/24/2024, 4:00 PM   If 7PM-7AM, please contact night-coverage

## 2024-01-24 NOTE — Progress Notes (Signed)
 Patient transferred to [facility] via [facility], VSS, all questions answered and belongings returned to patients daughter Donalynn Furlong.

## 2024-01-25 ENCOUNTER — Non-Acute Institutional Stay (SKILLED_NURSING_FACILITY): Admitting: Internal Medicine

## 2024-01-25 DIAGNOSIS — R278 Other lack of coordination: Secondary | ICD-10-CM | POA: Diagnosis not present

## 2024-01-25 DIAGNOSIS — E113293 Type 2 diabetes mellitus with mild nonproliferative diabetic retinopathy without macular edema, bilateral: Secondary | ICD-10-CM

## 2024-01-25 DIAGNOSIS — Z9181 History of falling: Secondary | ICD-10-CM | POA: Diagnosis not present

## 2024-01-25 DIAGNOSIS — M25511 Pain in right shoulder: Secondary | ICD-10-CM | POA: Diagnosis not present

## 2024-01-25 DIAGNOSIS — I5033 Acute on chronic diastolic (congestive) heart failure: Secondary | ICD-10-CM

## 2024-01-25 DIAGNOSIS — I350 Nonrheumatic aortic (valve) stenosis: Secondary | ICD-10-CM

## 2024-01-25 NOTE — Progress Notes (Unsigned)
 Location: Medical illustrator of Service:  SNF ([id number])  Provider:   Code Status:  Goals of Care:     01/23/2024    7:55 PM  Advanced Directives  Type of Advance Directive Out of facility DNR (pink MOST or yellow form)     Chief Complaint  Patient presents with   Acute Visit    HPI: Patient is a 89 y.o. female seen today for an acute visit for hospital Follow up  Patient is in SNF in [place]  Patient has a history of hypertension, HLD, type 2 diabetes with neuropathy   Patient also has a history of bilateral knee arthritis.   Was send to hospital for SOB and Right Shoulder pain Stayed for one day and was send back  SOB  Xray showed mild pulmonary edema. Her echo was done which shows normal EF but severe aortic stenosis. Patient is back on her Lasix.  She was diuresed in the hospital .  For right shoulder pain X-ray showed moderate narrowing of the subacromial space suggesting chronic rotator cuff injury  Patient is back to her baseline this morning.  Her pain seems to have resolved mostly.  She denies any shortness of breath Patient is not very active to staff uses stand up lift for her transfers Patient mostly sleeps in a recliner.  Past Medical History:  Diagnosis Date   Diabetes (HCC)    HLD (hyperlipidemia)    HTN (hypertension)    Hypercholesteremia    Low back pain    Obesities, morbid (HCC)     Past Surgical History:  Procedure Laterality Date   APPENDECTOMY     TONSILLECTOMY      Allergies  Allergen Reactions   Penicillin G Anaphylaxis    Throat closing up   Arthro-Complex [Nutritional Supplements] Other (See Comments)    Allergy not listed on MAR    Empagliflozin Other (See Comments)    Yeast infection   Levaquin [Levofloxacin In D5w] Other (See Comments)    Reaction not listed on MAR    Levofloxacin Other (See Comments)    Reaction not listed on MAR    Lipitor [Atorvastatin] Other (See Comments)    Allergy not listed on  MAR    Metformin Hcl Other (See Comments)    Stomach cramps   Terbinafine Other (See Comments)    Reaction not listed on [facility]    Outpatient Encounter Medications as of 01/25/2024  Medication Sig   furosemide (LASIX) 40 MG tablet Take 40 mg by mouth 4 (four) times a week.   acetaminophen (TYLENOL) 500 MG tablet Take 1,000 mg by mouth 3 (three) times daily as needed (reason not listed on MAR).   atorvastatin (LIPITOR) 20 MG tablet Take 20 mg by mouth daily.   cholecalciferol (VITAMIN D) 1000 UNITS tablet Take 1,000 Units by mouth daily.   diclofenac Sodium (VOLTAREN ARTHRITIS PAIN) 1 % GEL Apply 2 g topically in the morning, at noon, and at bedtime for 14 days. (Patient taking differently: Apply 2 g topically in the morning, at noon, and at bedtime. Course 01/22/24-02/05/24)   diltiazem (CARDIZEM CD) 180 MG 24 hr capsule Take 1 capsule (180 mg total) by mouth daily. (Patient taking differently: Take 180 mg by mouth daily. Hold if SBP is <120)   escitalopram (LEXAPRO) 10 MG tablet Take 10 mg by mouth daily.   glipiZIDE (GLUCOTROL XL) 2.5 MG 24 hr tablet Take 2.5 mg by mouth every morning.   HYDROcodone-acetaminophen (NORCO/VICODIN) 5-325  MG tablet Take 1 tablet by mouth 2 (two) times daily as needed for up to 5 days for moderate pain (pain score 4-6) or severe pain (pain score 7-10).   hydrOXYzine (ATARAX) 10 MG tablet Take 1 tablet (10 mg total) by mouth daily as needed. (Patient taking differently: Take 10 mg by mouth as needed for anxiety.)   meloxicam (MOBIC) 7.5 MG tablet Take 7.5 mg by mouth daily as needed for pain.   Multiple Vitamin (MULTIVITAMIN ADULT PO) Take 1 tablet by mouth daily.   OZEMPIC, 0.25 OR 0.5 MG/DOSE, 2 MG/3ML SOPN Inject 0.5 mg into the skin once a week.   potassium chloride SA (KLOR-CON M) 20 MEQ tablet Take 20 mEq by mouth 4 (four) times a week.   No facility-administered encounter medications on file as of 01/25/2024.    Review of Systems:  Review of Systems   Constitutional:  Negative for activity change and appetite change.  HENT: Negative.    Respiratory:  Negative for cough and shortness of breath.   Cardiovascular:  Negative for leg swelling.  Gastrointestinal:  Negative for constipation.  Genitourinary: Negative.   Musculoskeletal:  Positive for arthralgias and gait problem. Negative for myalgias.  Skin: Negative.   Neurological:  Negative for dizziness and weakness.  Psychiatric/Behavioral:  Negative for confusion, dysphoric mood and sleep disturbance.     Health Maintenance  Topic Date Due   OPHTHALMOLOGY EXAM  Never done   Zoster Vaccines- Shingrix (1 of 2) 05/23/1953   COVID-19 Vaccine (5 - 2024-25 season) 06/28/2023   INFLUENZA VACCINE  05/27/2024   HEMOGLOBIN A1C  06/09/2024   FOOT EXAM  08/25/2024   Medicare Annual Wellness (AWV)  11/26/2024   DTaP/Tdap/Td (3 - Td or Tdap) 08/17/2029   Pneumonia Vaccine 49+ Years old  Completed   HPV VACCINES  Aged Out   DEXA SCAN  Discontinued    Physical Exam: There were no vitals filed for this visit. There is no height or weight on file to calculate BMI. Physical Exam Vitals reviewed.  Constitutional:      Appearance: Normal appearance.  HENT:     Head: Normocephalic.     Nose: Nose normal.     Mouth/Throat:     Mouth: Mucous membranes are moist.     Pharynx: Oropharynx is clear.  Eyes:     Pupils: Pupils are equal, round, and reactive to light.  Cardiovascular:     Rate and Rhythm: Normal rate and regular rhythm.     Pulses: Normal pulses.     Heart sounds: Murmur heard.  Pulmonary:     Effort: Pulmonary effort is normal.     Breath sounds: Rales present.  Abdominal:     General: Abdomen is flat. Bowel sounds are normal.     Palpations: Abdomen is soft.  Musculoskeletal:        General: Swelling present.     Cervical back: Neck supple.     Comments: Right Shoulder exam some restriction in her Movement but mostly no Pain   Skin:    General: Skin is warm.   Neurological:     General: No focal deficit present.     Mental Status: She is alert and oriented to person, place, and time.  Psychiatric:        Mood and Affect: Mood normal.        Thought Content: Thought content normal.     Labs reviewed: Basic Metabolic Panel: Recent Labs    12/21/23 0000 01/23/24 1247  01/24/24 0333 01/24/24 1431  NA 142 140 140  --   K 3.4* 4.0 3.1* 3.4*  CL 103 107 105  --   CO2 29* 27 28  --   GLUCOSE  --  129* 115*  --   BUN 26* 13 12  --   CREATININE 0.7 0.43* 0.52  --   CALCIUM 8.9 8.8* 8.3*  --    Liver Function Tests: Recent Labs    01/23/24 1247  AST 30  ALT 15  ALKPHOS 105  BILITOT 1.7*  PROT 5.8*  ALBUMIN 2.7*   No results for input(s): "LIPASE", "AMYLASE" in the last 8760 hours. No results for input(s): "AMMONIA" in the last 8760 hours. CBC: Recent Labs    01/23/24 1247 01/24/24 0333  WBC 9.7 9.9  NEUTROABS 5.6  --   HGB 11.7* 11.0*  HCT 35.5* 33.2*  MCV 92.7 92.7  PLT 291 291   Lipid Panel: Recent Labs    12/11/23 0000  CHOL 157  HDL 72*  LDLCALC 72  TRIG 66   Lab Results  Component Value Date   HGBA1C 7.3 12/11/2023    Procedures since last visit: ECHOCARDIOGRAM COMPLETE Result Date: 01/24/2024    ECHOCARDIOGRAM REPORT   Patient Name:   Patricia Petersen Date of Exam: 01/24/2024 Medical Rec #:  [id number]       Height:       65.0 in Accession #:    [id number]      Weight:       202.0 lb Date of Birth:  Apr 28, 1934       BSA:          1.987 m Patient Age:    89 years        BP:           133/63 mmHg Patient Gender: F               HR:           68 bpm. Exam Location:  Inpatient Procedure: 2D Echo, Cardiac Doppler and Color Doppler (Both Spectral and Color            Flow Doppler were utilized during procedure). Indications:    Cardiomyopathy  History:        Patient has prior history of Echocardiogram examinations, most                 recent 09/05/2015. CHF, PAD; Risk Factors:Hypertension and                 Diabetes.   Sonographer:    Amy Chionchio Referring Phys: [id number] Doreen Salvage AMIN IMPRESSIONS  1. Left ventricular ejection fraction, by estimation, is 60 to 65%. The left ventricle has normal function. The left ventricle has no regional wall motion abnormalities. There is mild concentric left ventricular hypertrophy. Left ventricular diastolic parameters are consistent with Grade II diastolic dysfunction (pseudonormalization).  2. Right ventricular systolic function is hyperdynamic. The right ventricular size is normal. There is moderately elevated pulmonary artery systolic pressure.  3. Left atrial size was moderately dilated.  4. The mitral valve is grossly normal. Trivial mitral valve regurgitation. The mean mitral valve gradient is 3.0 mmHg.  5. Aortic valve calcification . The aortic valve is calcified. Aortic valve regurgitation is not visualized. Severe aortic valve stenosis. Aortic valve area, by VTI measures 0.92 cm. Aortic valve mean gradient measures 42.0 mmHg. Aortic valve Vmax measures 4.13 m/s. Aortic valve acceleration time measures 120 msec.  6.  The inferior vena cava is dilated in size with <50% respiratory variability, suggesting right atrial pressure of 15 mmHg. Comparison(s): Prior images unable to be directly viewed, comparison made by report only. In comparison from 2016 report- aortic sclerosis has progressed to severe aortic stenosis. FINDINGS  Left Ventricle: No ventricular strain or 3D. Left ventricular ejection fraction, by estimation, is 60 to 65%. The left ventricle has normal function. The left ventricle has no regional wall motion abnormalities. Strain was performed and the global longitudinal strain is indeterminate. The left ventricular internal cavity size was normal in size. There is mild concentric left ventricular hypertrophy. Left ventricular diastolic parameters are consistent with Grade II diastolic dysfunction (pseudonormalization). Right Ventricle: The right ventricular size is normal.  No increase in right ventricular wall thickness. Right ventricular systolic function is hyperdynamic. There is moderately elevated pulmonary artery systolic pressure. The tricuspid regurgitant velocity is 2.93 m/s, and with an assumed right atrial pressure of 15 mmHg, the estimated right ventricular systolic pressure is 49.3 mmHg. Left Atrium: Left atrial size was moderately dilated. Right Atrium: Right atrial size was normal in size. Pericardium: Trivial pericardial effusion is present. The pericardial effusion is anterior to the right ventricle. Mitral Valve: The mitral valve is grossly normal. Trivial mitral valve regurgitation. MV peak gradient, 6.7 mmHg. The mean mitral valve gradient is 3.0 mmHg. Tricuspid Valve: The tricuspid valve is normal in structure. Tricuspid valve regurgitation is not demonstrated. No evidence of tricuspid stenosis. Aortic Valve: Aortic valve calcification. The aortic valve is calcified. Aortic valve regurgitation is not visualized. Severe aortic stenosis is present. Aortic valve mean gradient measures 42.0 mmHg. Aortic valve peak gradient measures 68.2 mmHg. Aortic  valve area, by VTI measures 0.92 cm. Pulmonic Valve: The pulmonic valve was grossly normal. Pulmonic valve regurgitation is not visualized. No evidence of pulmonic stenosis. Aorta: The aortic root and ascending aorta are structurally normal, with no evidence of dilitation. Venous: The inferior vena cava is dilated in size with less than 50% respiratory variability, suggesting right atrial pressure of 15 mmHg. IAS/Shunts: The atrial septum is grossly normal. Additional Comments: 3D was performed not requiring image post processing on an independent workstation and was indeterminate.  LEFT VENTRICLE PLAX 2D LVIDd:         4.40 cm      Diastology LVIDs:         3.50 cm      LV e' lateral:   7.18 cm/s LV PW:         1.10 cm      LV E/e' lateral: 15.7 LV IVS:        1.20 cm LVOT diam:     2.00 cm LV SV:         92 LV SV Index:    46 LVOT Area:     3.14 cm  LV Volumes (MOD) LV vol d, MOD A2C: 106.0 ml LV vol d, MOD A4C: 102.0 ml LV vol s, MOD A2C: 43.5 ml LV vol s, MOD A4C: 41.2 ml LV SV MOD A2C:     62.5 ml LV SV MOD A4C:     102.0 ml LV SV MOD BP:      59.7 ml RIGHT VENTRICLE          IVC RV Basal diam:  3.00 cm  IVC diam: 2.70 cm RV Mid diam:    3.00 cm TAPSE (M-mode): 2.5 cm LEFT ATRIUM             Index  RIGHT ATRIUM           Index LA Vol (A2C):   77.7 ml 39.11 ml/m  RA Area:     18.00 cm LA Vol (A4C):   87.5 ml 44.05 ml/m  RA Volume:   47.00 ml  23.66 ml/m LA Biplane Vol: 86.3 ml 43.44 ml/m  AORTIC VALVE AV Area (Vmax):    0.96 cm AV Area (Vmean):   0.93 cm AV Area (VTI):     0.92 cm AV Vmax:           413.00 cm/s AV Vmean:          298.750 cm/s AV VTI:            0.992 m AV Peak Grad:      68.2 mmHg AV Mean Grad:      42.0 mmHg LVOT Vmax:         126.00 cm/s LVOT Vmean:        88.700 cm/s LVOT VTI:          0.292 m LVOT/AV VTI ratio: 0.29  AORTA Ao Root diam: 2.60 cm Ao Asc diam:  3.20 cm MITRAL VALVE                TRICUSPID VALVE MV Area (PHT): 3.37 cm     TR Peak grad:   34.3 mmHg MV Area VTI:   2.21 cm     TR Vmax:        293.00 cm/s MV Peak grad:  6.7 mmHg MV Mean grad:  3.0 mmHg     SHUNTS MV Vmax:       1.29 m/s     Systemic VTI:  0.29 m MV Vmean:      86.8 cm/s    Systemic Diam: 2.00 cm MV Decel Time: 225 msec MV E velocity: 113.00 cm/s MV A velocity: 93.00 cm/s MV E/A ratio:  1.22 Riley Lam MD Electronically signed by Riley Lam MD Signature Date/Time: 01/24/2024/12:23:32 PM    Final    DG Chest Portable 1 View Result Date: 01/23/2024 CLINICAL DATA:  low oxygen EXAM: PORTABLE CHEST 1 VIEW COMPARISON:  January 24, 2006 FINDINGS: The cardiomediastinal silhouette is enlarged in contour. Small bilateral pleural effusions. No pneumothorax. Peribronchial cuffing with diffuse interstitial prominence. Several calcific densities project along the contours of the LEFT humerus and glenoid,  incompletely assessed IMPRESSION: 1. Constellation of findings are favored to reflect pulmonary edema with small bilateral pleural effusions. 2. Several calcific densities project along the contours of the LEFT humerus and glenoid, incompletely assessed. Recommend dedicated radiographs of the LEFT shoulder. Electronically Signed   By: Meda Klinefelter M.D.   On: 01/23/2024 13:35   DG Shoulder Right Result Date: 01/23/2024 CLINICAL DATA:  Possible right shoulder dislocation. EXAM: RIGHT SHOULDER - 2+ VIEW COMPARISON:  None Available. FINDINGS: There is no evidence of fracture or dislocation. Moderate narrowing of subacromial space is noted. Soft tissues are unremarkable. IMPRESSION: No fracture or dislocation is noted. Moderate narrowing of subacromial space is noted suggesting chronic rotator cuff injury. Electronically Signed   By: Lupita Raider M.D.   On: 01/23/2024 11:53    Assessment/Plan 1. Acute on chronic diastolic congestive heart failure (HCC) (Primary) On Lasix Lasix 40 mg every day for 1 week followed by 40 mg 4/week for her quality of care  2. Acute pain of right shoulder Mostly resolved  Rotator cuff injury Wants to Continue to use Stand up lift Discussed with OT and Daughter  3. Severe aortic stenosis Cardiology referral Made Discussed with Daughter  4. Type 2 diabetes mellitus  Glipizide and Ozempic CBGS are in good ranges 130-170 A1C 7.3 in 2/25 5 Severe Arthritis of her Knees Unable to do her transfers Now in SNF using Stand up lift  6 Hypercholesterolemia LDL 72 on statin   7. Anxiety disorder due to medical condition Lexapro and Vistaril PRN 8 HTN Off Benicar now Only on Cardizem  Labs/tests ordered:  BMP in 1 week Next appt:  Visit date not found

## 2024-01-29 ENCOUNTER — Encounter: Payer: Self-pay | Admitting: Internal Medicine

## 2024-01-29 DIAGNOSIS — R278 Other lack of coordination: Secondary | ICD-10-CM | POA: Diagnosis not present

## 2024-01-29 DIAGNOSIS — Z9181 History of falling: Secondary | ICD-10-CM | POA: Diagnosis not present

## 2024-01-29 DIAGNOSIS — M6389 Disorders of muscle in diseases classified elsewhere, multiple sites: Secondary | ICD-10-CM | POA: Diagnosis not present

## 2024-01-29 DIAGNOSIS — I110 Hypertensive heart disease with heart failure: Secondary | ICD-10-CM | POA: Diagnosis not present

## 2024-01-29 LAB — CBC AND DIFFERENTIAL
HCT: 35 — AB (ref 36–46)
Hemoglobin: 11.6 — AB (ref 12.0–16.0)
Platelets: 312 10*3/uL (ref 150–400)
WBC: 8.4

## 2024-01-29 LAB — CBC: RBC: 3.74 — AB (ref 3.87–5.11)

## 2024-01-31 NOTE — Progress Notes (Unsigned)
 Cardiology Office Note   Date:  02/03/2024   ID:  Patricia Petersen, DOB 08-08-1934, MRN [id number]  PCP:  Mahlon Gammon, MD  Cardiologist:   None Referring:  Mahlon Gammon, MD  Chief Complaint  Patient presents with   Aortic Stenosis      History of Present Illness: Patricia Petersen is a 89 y.o. female who presents for evaluation of aortic stenosis.  Echo in March 2025 demonstrated an EF of 60 - 65%.  There is moderately elevated pulmonary pressures.  AS was severe with a gradient of 42 mm and VTI of 0.92.  She was in the hospital with acute on chronic congestive heart failure.  She lives at [facility].  She has been told about a murmur.  I was able to see an old echocardiogram from 2016 with aortic sclerosis but no stenosis.  Otherwise she has not had any significant cardiac history.  She gets around in a wheelchair because of knee problems and she is skilled nursing because she has to be lifted with her joint problems.  She otherwise does pretty well.  She does have chronic lower extremity swelling and this may have been slightly progressive.  She was not having any significant weight gain that she knew.  She was not having any shortness of breath that she noted.  She was not describing PND or orthopnea.  She said the shortness of breath happened rather quickly.  She was treated with IV Lasix and had significant improvement.  She had been taking her Lasix 3 times a week but was discharged on daily Lasix.  She does not use excessive salt.  She does not drink a lot of fluid by her report.  She not been having any chest pressure, neck or arm discomfort.  Cough fevers or chills.   Past Medical History:  Diagnosis Date   Diabetes (HCC)    HLD (hyperlipidemia)    HTN (hypertension)    Hypercholesteremia    Low back pain    Obesities, morbid (HCC)     Past Surgical History:  Procedure Laterality Date   APPENDECTOMY     TONSILLECTOMY       Current Outpatient Medications   Medication Sig Dispense Refill   acetaminophen (TYLENOL) 500 MG tablet Take 1,000 mg by mouth 3 (three) times daily as needed (reason not listed on MAR).     atorvastatin (LIPITOR) 20 MG tablet Take 20 mg by mouth daily.     cholecalciferol (VITAMIN D) 1000 UNITS tablet Take 1,000 Units by mouth daily.     diclofenac Sodium (VOLTAREN ARTHRITIS PAIN) 1 % GEL Apply 2 g topically in the morning, at noon, and at bedtime for 14 days. (Patient taking differently: Apply 2 g topically in the morning, at noon, and at bedtime. Course 01/22/24-02/05/24)     diltiazem (CARDIZEM CD) 180 MG 24 hr capsule Take 1 capsule (180 mg total) by mouth daily. (Patient taking differently: Take 180 mg by mouth daily. Hold if SBP is <120)     escitalopram (LEXAPRO) 10 MG tablet Take 10 mg by mouth daily.     furosemide (LASIX) 40 MG tablet Take 40 mg by mouth 4 (four) times a week.     glipiZIDE (GLUCOTROL XL) 2.5 MG 24 hr tablet Take 2.5 mg by mouth every morning.     HYDROcodone-acetaminophen (NORCO/VICODIN) 5-325 MG tablet Take 1 tablet by mouth every 6 (six) hours as needed for moderate pain (pain score 4-6).  hydrOXYzine (ATARAX) 10 MG tablet Take 1 tablet (10 mg total) by mouth daily as needed. (Patient taking differently: Take 10 mg by mouth as needed for anxiety.)     meloxicam (MOBIC) 7.5 MG tablet Take 7.5 mg by mouth daily as needed for pain.     Multiple Vitamin (MULTIVITAMIN ADULT PO) Take 1 tablet by mouth daily.     olmesartan-hydrochlorothiazide (BENICAR HCT) 40-25 MG tablet Take 1 tablet by mouth daily.     OZEMPIC, 0.25 OR 0.5 MG/DOSE, 2 MG/3ML SOPN Inject 0.5 mg into the skin once a week.     potassium chloride SA (KLOR-CON M) 20 MEQ tablet Take 20 mEq by mouth 4 (four) times a week.     Sennosides-Docusate Sodium (SENNA PLUS PO) Take by mouth daily.     No current facility-administered medications for this visit.    Allergies:   Penicillin g, Arthro-complex [nutritional supplements],  Empagliflozin, Levaquin [levofloxacin in d5w], Levofloxacin, Lipitor [atorvastatin], Metformin hcl, and Terbinafine    Social History:  The patient  reports that she has never smoked. She has never used smokeless tobacco.   Family History:  The patient's family history includes Heart disease in an other family member.    ROS:  Please see the history of present illness.   Otherwise, review of systems are positive for none.   All other systems are reviewed and negative.    PHYSICAL EXAM: VS:  BP (!) 147/74 (BP Location: Left Arm, Patient Position: Sitting, Cuff Size: Normal)   Pulse 70   Ht 5\' 3"  (1.6 m)   Wt 201 lb (91.2 kg)   SpO2 94%   BMI 35.61 kg/m  , BMI Body mass index is 35.61 kg/m. GENERAL:  Well appearing HEENT:  Pupils equal round and reactive, fundi not visualized, oral mucosa unremarkable NECK:  No jugular venous distention, waveform within normal limits, carotid upstroke brisk and symmetric, no bruits, no thyromegaly LYMPHATICS:  No cervical, inguinal adenopathy LUNGS: Decreased breath sounds bilaterally with fine basilar crackles BACK:  No CVA tenderness CHEST:  Unremarkable HEART:  PMI not displaced or sustained,S1 and S2 within normal limits, no S3, no S4, no clicks, no rubs, 3 out of 6 apical systolic murmur mid-to-late peaking radiating at the aortic outflow tract, no diastolic murmurs ABD:  Flat, positive bowel sounds normal in frequency in pitch, no bruits, no rebound, no guarding, no midline pulsatile mass, no hepatomegaly, no splenomegaly EXT:  2 plus pulses throughout, no edema, no cyanosis no clubbing SKIN:  No rashes no nodules NEURO:  Cranial nerves II through XII grossly intact, motor grossly intact throughout [facility]:  Cognitively intact, oriented to person place and time    EKG:  EKG Interpretation Date/Time:  Tuesday February 02 2024 08:32:55 EDT Ventricular Rate:  70 PR Interval:  156 QRS Duration:  116 QT Interval:  414 QTC Calculation: 447 R  Axis:   -51  Text Interpretation: Sinus rhythm with Premature atrial complexes Left anterior fascicular block Left ventricular hypertrophy with QRS widening and repolarization abnormality ( R in aVL , Cornell product , Romhilt-Estes ) When compared with ECG of 23-Jan-2024 12:49, No significant change since last tracing Confirmed by Rollene Rotunda ([id number]) on 02/02/2024 12:30:45 PM     Recent Labs: 01/23/2024: ALT 15; B Natriuretic Peptide 134.5 01/24/2024: BUN 12; Creatinine, Ser 0.52; Hemoglobin 11.0; Platelets 291; Potassium 3.4; Sodium 140    Lipid Panel    Component Value Date/Time   CHOL 157 12/11/2023 0000   TRIG 66 12/11/2023  0000   HDL 72 (A) 12/11/2023 0000   LDLCALC 72 12/11/2023 0000      Wt Readings from Last 3 Encounters:  02/02/24 201 lb (91.2 kg)  01/25/24 205 lb (93 kg)  01/23/24 202 lb (91.6 kg)      Other studies Reviewed: Additional studies/ records that were reviewed today include: Hospital records. Review of the above records demonstrates:  Please see elsewhere in the note.     ASSESSMENT AND PLAN:  Aortic stenosis: The patient has severe aortic stenosis.  I reviewed the anatomy in detail with the family.  At present she would like to be conservative.  She does not want to consider a procedure but we talked about it in the future.  She will continue with the diuretic.  She understands salt and fluid restriction.  She understands symptoms that would prompt her to call us.  She does agree to come back and talk about potential for elective valve procedure in the future.   Dyslipidemia: LDL most recently was 72.  HDL was 72.  No change in therapy.    Current medicines are reviewed at length with the patient today.  The patient does not have concerns regarding medicines.  The following changes have been made:  no change  Labs/ tests ordered today include:   Orders Placed This Encounter  Procedures   EKG 12-Lead     Disposition:   FU with me in 3  months.  That time we will again talk about possible elective TAVR.   Signed, Rollene Rotunda, MD  02/03/2024 7:07 AM    [facility]

## 2024-02-01 DIAGNOSIS — I110 Hypertensive heart disease with heart failure: Secondary | ICD-10-CM | POA: Diagnosis not present

## 2024-02-01 DIAGNOSIS — I10 Essential (primary) hypertension: Secondary | ICD-10-CM | POA: Diagnosis not present

## 2024-02-01 LAB — BASIC METABOLIC PANEL WITH GFR
BUN: 11 (ref 4–21)
CO2: 26 — AB (ref 13–22)
Chloride: 107 (ref 99–108)
Creatinine: 0.4 — AB (ref 0.5–1.1)
Glucose: 109
Potassium: 3.9 meq/L (ref 3.5–5.1)
Sodium: 142 (ref 137–147)

## 2024-02-01 LAB — CBC: RBC: 3.67 — AB (ref 3.87–5.11)

## 2024-02-01 LAB — COMPREHENSIVE METABOLIC PANEL WITH GFR
Calcium: 8.7 (ref 8.7–10.7)
eGFR: >90

## 2024-02-01 LAB — CBC AND DIFFERENTIAL
HCT: 34 — AB (ref 36–46)
Hemoglobin: 11.3 — AB (ref 12.0–16.0)
Platelets: 316 10*3/uL (ref 150–400)
WBC: 8

## 2024-02-02 ENCOUNTER — Ambulatory Visit: Attending: Cardiology | Admitting: Cardiology

## 2024-02-02 ENCOUNTER — Encounter: Payer: Self-pay | Admitting: Cardiology

## 2024-02-02 VITALS — BP 147/74 | HR 70 | Ht 63.0 in | Wt 201.0 lb

## 2024-02-02 DIAGNOSIS — E785 Hyperlipidemia, unspecified: Secondary | ICD-10-CM

## 2024-02-02 DIAGNOSIS — R278 Other lack of coordination: Secondary | ICD-10-CM | POA: Diagnosis not present

## 2024-02-02 DIAGNOSIS — I350 Nonrheumatic aortic (valve) stenosis: Secondary | ICD-10-CM

## 2024-02-02 DIAGNOSIS — Z9181 History of falling: Secondary | ICD-10-CM | POA: Diagnosis not present

## 2024-02-02 DIAGNOSIS — M6389 Disorders of muscle in diseases classified elsewhere, multiple sites: Secondary | ICD-10-CM | POA: Diagnosis not present

## 2024-02-02 NOTE — Patient Instructions (Signed)
 Medication Instructions:  Your physician recommends that you continue on your current medications as directed. Please refer to the Current Medication list given to you today.    *If you need a refill on your cardiac medications before your next appointment, please call your pharmacy*   Lab Work: None    If you have labs (blood work) drawn today and your tests are completely normal, you will receive your results only by: MyChart Message (if you have MyChart) OR A paper copy in the mail If you have any lab test that is abnormal or we need to change your treatment, we will call you to review the results.   Testing/Procedures:  None   Follow-Up: At [facility], you and your health needs are our priority.  As part of our continuing mission to provide you with exceptional heart care, we have created designated Provider Care Teams.  These Care Teams include your primary Cardiologist (physician) and Advanced Practice Providers (APPs -  Physician Assistants and Nurse Practitioners) who all work together to provide you with the care you need, when you need it.  We recommend signing up for the patient portal called "MyChart".  Sign up information is provided on this After Visit Summary.  MyChart is used to connect with patients for Virtual Visits (Telemedicine).  Patients are able to view lab/test results, encounter notes, upcoming appointments, etc.  Non-urgent messages can be sent to your provider as well.   To learn more about what you can do with MyChart, go to [email or web address].    Your next appointment:   3 month(s)  The format for your next appointment:   In Person  Provider:   [facility]   Other Instructions

## 2024-02-03 ENCOUNTER — Encounter: Payer: Self-pay | Admitting: Cardiology

## 2024-02-03 DIAGNOSIS — S43001A Unspecified subluxation of right shoulder joint, initial encounter: Secondary | ICD-10-CM | POA: Diagnosis not present

## 2024-02-09 ENCOUNTER — Ambulatory Visit (INDEPENDENT_AMBULATORY_CARE_PROVIDER_SITE_OTHER): Admitting: Podiatry

## 2024-02-09 ENCOUNTER — Encounter: Payer: Self-pay | Admitting: Podiatry

## 2024-02-09 VITALS — Ht 63.0 in | Wt 201.0 lb

## 2024-02-09 DIAGNOSIS — E119 Type 2 diabetes mellitus without complications: Secondary | ICD-10-CM

## 2024-02-09 DIAGNOSIS — M79674 Pain in right toe(s): Secondary | ICD-10-CM | POA: Diagnosis not present

## 2024-02-09 DIAGNOSIS — M79675 Pain in left toe(s): Secondary | ICD-10-CM

## 2024-02-09 DIAGNOSIS — B351 Tinea unguium: Secondary | ICD-10-CM

## 2024-02-15 ENCOUNTER — Non-Acute Institutional Stay (SKILLED_NURSING_FACILITY): Payer: Self-pay | Admitting: Internal Medicine

## 2024-02-15 DIAGNOSIS — R251 Tremor, unspecified: Secondary | ICD-10-CM | POA: Diagnosis not present

## 2024-02-15 DIAGNOSIS — Z9181 History of falling: Secondary | ICD-10-CM | POA: Diagnosis not present

## 2024-02-15 DIAGNOSIS — M25511 Pain in right shoulder: Secondary | ICD-10-CM | POA: Diagnosis not present

## 2024-02-15 DIAGNOSIS — R202 Paresthesia of skin: Secondary | ICD-10-CM | POA: Diagnosis not present

## 2024-02-15 DIAGNOSIS — R600 Localized edema: Secondary | ICD-10-CM

## 2024-02-15 DIAGNOSIS — I350 Nonrheumatic aortic (valve) stenosis: Secondary | ICD-10-CM | POA: Diagnosis not present

## 2024-02-15 DIAGNOSIS — R200 Anesthesia of skin: Secondary | ICD-10-CM

## 2024-02-15 DIAGNOSIS — R278 Other lack of coordination: Secondary | ICD-10-CM | POA: Diagnosis not present

## 2024-02-15 DIAGNOSIS — M6389 Disorders of muscle in diseases classified elsewhere, multiple sites: Secondary | ICD-10-CM | POA: Diagnosis not present

## 2024-02-16 NOTE — Progress Notes (Signed)
 Subjective:  Patient ID: Patricia Petersen, female    DOB: [birth year],  MRN: [id number]  Patricia Petersen presents to clinic today for preventative diabetic foot care and painful, elongated thickened toenails x 10 which are symptomatic when wearing enclosed shoe gear. This interferes with his/her daily activities. She is accompanied by her daughter on today's visit. Patient resides at at [facility]. States she is now wheelchair bound. Chief Complaint  Patient presents with   Nail Problem    Pt is here for Patricia Petersen unsure of last A1C PCP is Dr Venice Gillis and LOV was in March.   New problem(s): None.   PCP is Patricia Shiley, Patricia Petersen.  Allergies  Allergen Reactions   Penicillin G Anaphylaxis    Throat closing up   Arthro-Complex [Nutritional Supplements] Other (See Comments)    Allergy not listed on MAR    Empagliflozin Other (See Comments)    Yeast infection   Levaquin [Levofloxacin In D5w] Other (See Comments)    Reaction not listed on MAR    Levofloxacin Other (See Comments)    Reaction not listed on MAR    Lipitor [Atorvastatin ] Other (See Comments)    Allergy not listed on MAR    Metformin Hcl Other (See Comments)    Stomach cramps   Terbinafine Other (See Comments)    Reaction not listed on MAR    Review of Systems: Negative except as noted in the HPI.  Objective: No changes noted in today's physical examination. There were no vitals filed for this visit. Patricia Petersen is a pleasant 89 y.o. female obese in NAD. AAO x 3.  Vascular Examination: Capillary refill time immediate b/l. Vascular status intact b/l with palpable pedal pulses. No pain with calf compression b/l. Skin temperature gradient WNL b/l. No cyanosis or clubbing b/l. No ischemia or gangrene noted b/l. Dependent edema noted b/l LE.  Neurological Examination: Sensation grossly intact b/l with 10 gram monofilament. Vibratory sensation intact b/l.   Dermatological Examination: Pedal skin with normal turgor,  texture and tone b/l.  No open wounds. No interdigital macerations.   Toenails 1-5 b/l thick, discolored, elongated with subungual debris and pain on dorsal palpation.   There is evidence of subacute subungual hematoma of the L 3rd toe. Nailplate remains adhered. There is no  tenderness to palpation. No corns, calluses nor porokeratotic lesions noted.  Musculoskeletal Examination: Muscle strength 5/5 to all lower extremity muscle groups bilaterally. No pain, crepitus or joint limitation noted with ROM bilateral LE. HAV with bunion deformity noted b/l LE. Hammertoe deformity noted 2-5 b/l.  Radiographs: None  Last A1c:      12/11/2023   12:00 AM  Hemoglobin A1C  Hemoglobin-A1c 7.3         This result is from an external source.    Assessment/Plan: 1. Pain due to onychomycosis of toenails of both feet   2. Controlled type 2 diabetes mellitus without complication, without long-term current use of insulin  [facility])    Patient was evaluated and treated. All patient's and/or POA's questions/concerns addressed on today's visit. Toenails 1-5 debrided in length and girth without incident. Continue foot and shoe inspections daily. Monitor blood glucose per PCP/Endocrinologist's recommendations. Continue soft, supportive shoe gear daily. Report any pedal injuries to medical professional. Call office if there are any questions/concerns. -Facility to continue fall precautions and pressure precautions. -Patient/POA to call should there be question/concern in the interim.   Return in about 3 months (around 05/10/2024).  Patricia Petersen, DPM  [place] LOCATION: [address], [place] [address]                   Office [phone]   [facility] LOCATION: [address] [place], [place] [address] Office [phone]

## 2024-02-17 DIAGNOSIS — R278 Other lack of coordination: Secondary | ICD-10-CM | POA: Diagnosis not present

## 2024-02-17 DIAGNOSIS — M6389 Disorders of muscle in diseases classified elsewhere, multiple sites: Secondary | ICD-10-CM | POA: Diagnosis not present

## 2024-02-17 DIAGNOSIS — Z9181 History of falling: Secondary | ICD-10-CM | POA: Diagnosis not present

## 2024-02-19 NOTE — Progress Notes (Signed)
 Location: Medical illustrator of Service:  SNF (31)  Provider:   Code Status:  Goals of Care:     01/23/2024    7:55 PM  Advanced Directives  Type of Advance Directive Out of facility DNR (pink MOST or yellow form)     Chief Complaint  Patient presents with   Acute Visit    HPI: Patient is a 89 y.o. female seen today for an acute visit for Tremors and Weakness of Right hand Patient is in SNF in [place]   Patient has a history of hypertension, HLD, type 2 diabetes with neuropathy   Patient also has a history of bilateral knee arthritis Severe AS With CHF  Noticed to have Weakness in the right hand But patient says it is more like tingling in her hand She also has tremor in her hand which is Benign Also Using Stand up lift for transfers And Per OT she is doing well    Past Medical History:  Diagnosis Date   Diabetes (HCC)    HLD (hyperlipidemia)    HTN (hypertension)    Hypercholesteremia    Low back pain    Obesities, morbid (HCC)     Past Surgical History:  Procedure Laterality Date   APPENDECTOMY     TONSILLECTOMY      Allergies  Allergen Reactions   Penicillin G Anaphylaxis    Throat closing up   Arthro-Complex [Nutritional Supplements] Other (See Comments)    Allergy not listed on MAR    Empagliflozin Other (See Comments)    Yeast infection   Levaquin [Levofloxacin In D5w] Other (See Comments)    Reaction not listed on MAR    Levofloxacin Other (See Comments)    Reaction not listed on MAR    Lipitor [Atorvastatin ] Other (See Comments)    Allergy not listed on MAR    Metformin Hcl Other (See Comments)    Stomach cramps   Terbinafine Other (See Comments)    Reaction not listed on [facility]    Outpatient Encounter Medications as of 02/15/2024  Medication Sig   acetaminophen  (TYLENOL ) 500 MG tablet Take 1,000 mg by mouth 3 (three) times daily as needed (reason not listed on MAR).   atorvastatin  (LIPITOR) 20 MG tablet Take 20 mg by  mouth daily.   cholecalciferol  (VITAMIN D ) 1000 UNITS tablet Take 1,000 Units by mouth daily.   diltiazem  (CARDIZEM  CD) 180 MG 24 hr capsule Take 1 capsule (180 mg total) by mouth daily. (Patient taking differently: Take 180 mg by mouth daily. Hold if SBP is <120)   escitalopram  (LEXAPRO ) 10 MG tablet Take 10 mg by mouth daily.   furosemide  (LASIX ) 40 MG tablet Take 40 mg by mouth 4 (four) times a week.   glipiZIDE (GLUCOTROL XL) 2.5 MG 24 hr tablet Take 2.5 mg by mouth every morning.   HYDROcodone -acetaminophen  (NORCO/VICODIN) 5-325 MG tablet Take 1 tablet by mouth every 6 (six) hours as needed for moderate pain (pain score 4-6).   hydrOXYzine  (ATARAX ) 10 MG tablet Take 1 tablet (10 mg total) by mouth daily as needed. (Patient taking differently: Take 10 mg by mouth as needed for anxiety.)   meloxicam (MOBIC) 7.5 MG tablet Take 7.5 mg by mouth daily as needed for pain.   Multiple Vitamin (MULTIVITAMIN ADULT PO) Take 1 tablet by mouth daily.   olmesartan -hydrochlorothiazide (BENICAR  HCT) 40-25 MG tablet Take 1 tablet by mouth daily.   OZEMPIC, 0.25 OR 0.5 MG/DOSE, 2 MG/3ML SOPN Inject 0.5  mg into the skin once a week.   potassium chloride  SA (KLOR-CON  M) 20 MEQ tablet Take 20 mEq by mouth 4 (four) times a week.   Sennosides-Docusate Sodium  (SENNA PLUS PO) Take by mouth daily.   No facility-administered encounter medications on file as of 02/15/2024.    Review of Systems:  Review of Systems  Constitutional:  Negative for activity change and appetite change.  HENT: Negative.    Respiratory:  Negative for cough and shortness of breath.   Cardiovascular:  Positive for leg swelling.  Gastrointestinal:  Negative for constipation.  Genitourinary: Negative.   Musculoskeletal:  Positive for arthralgias and gait problem. Negative for myalgias.  Skin: Negative.   Neurological:  Positive for tremors and weakness. Negative for dizziness.  Psychiatric/Behavioral:  Negative for confusion, dysphoric mood  and sleep disturbance.     Health Maintenance  Topic Date Due   OPHTHALMOLOGY EXAM  Never done   Zoster Vaccines- Shingrix (1 of 2) 05/23/1953   COVID-19 Vaccine (5 - 2024-25 season) 06/28/2023   Medicare Annual Wellness (AWV)  11/25/2023   INFLUENZA VACCINE  05/27/2024   HEMOGLOBIN A1C  06/09/2024   FOOT EXAM  08/25/2024   DTaP/Tdap/Td (3 - Td or Tdap) 08/17/2029   Pneumonia Vaccine 63+ Years old  Completed   HPV VACCINES  Aged Out   Meningococcal B Vaccine  Aged Out   DEXA SCAN  Discontinued    Physical Exam: There were no vitals filed for this visit. There is no height or weight on file to calculate BMI. Physical Exam Vitals reviewed.  Constitutional:      Appearance: Normal appearance.  HENT:     Head: Normocephalic.     Nose: Nose normal.     Mouth/Throat:     Mouth: Mucous membranes are moist.     Pharynx: Oropharynx is clear.  Eyes:     Pupils: Pupils are equal, round, and reactive to light.  Cardiovascular:     Rate and Rhythm: Normal rate and regular rhythm.     Pulses: Normal pulses.     Heart sounds: Murmur heard.  Pulmonary:     Effort: Pulmonary effort is normal.     Breath sounds: Normal breath sounds.  Abdominal:     General: Abdomen is flat. Bowel sounds are normal.     Palpations: Abdomen is soft.  Musculoskeletal:        General: No swelling.     Cervical back: Neck supple.  Skin:    General: Skin is warm.  Neurological:     General: No focal deficit present.     Mental Status: She is alert and oriented to person, place, and time.     Comments: No Focal Weakness Does have resting tremor but also with movement  Psychiatric:        Mood and Affect: Mood normal.        Thought Content: Thought content normal.     Labs reviewed: Basic Metabolic Panel: Recent Labs    12/21/23 0000 01/23/24 1247 01/24/24 0333 01/24/24 1431  NA 142 140 140  --   K 3.4* 4.0 3.1* 3.4*  CL 103 107 105  --   CO2 29* 27 28  --   GLUCOSE  --  129* 115*  --    BUN 26* 13 12  --   CREATININE 0.7 0.43* 0.52  --   CALCIUM  8.9 8.8* 8.3*  --    Liver Function Tests: Recent Labs    01/23/24 1247  AST 30  ALT 15  ALKPHOS 105  BILITOT 1.7*  PROT 5.8*  ALBUMIN 2.7*   No results for input(s): "LIPASE", "AMYLASE" in the last 8760 hours. No results for input(s): "AMMONIA" in the last 8760 hours. CBC: Recent Labs    01/23/24 1247 01/24/24 0333  WBC 9.7 9.9  NEUTROABS 5.6  --   HGB 11.7* 11.0*  HCT 35.5* 33.2*  MCV 92.7 92.7  PLT 291 291   Lipid Panel: Recent Labs    12/11/23 0000  CHOL 157  HDL 72*  LDLCALC 72  TRIG 66   Lab Results  Component Value Date   HGBA1C 7.3 12/11/2023    Procedures since last visit: ECHOCARDIOGRAM COMPLETE Result Date: 01/24/2024    ECHOCARDIOGRAM REPORT   Patient Name:   Patricia Petersen Date of Exam: 01/24/2024 Medical Rec #:  [id number]       Height:       65.0 in Accession #:    [id number]      Weight:       202.0 lb Date of Birth:  May 27, 1934       BSA:          1.987 m Patient Age:    89 years        BP:           133/63 mmHg Patient Gender: F               HR:           68 bpm. Exam Location:  Inpatient Procedure: 2D Echo, Cardiac Doppler and Color Doppler (Both Spectral and Color            Flow Doppler were utilized during procedure). Indications:    Cardiomyopathy  History:        Patient has prior history of Echocardiogram examinations, most                 recent 09/05/2015. CHF, PAD; Risk Factors:Hypertension and                 Diabetes.  Sonographer:    Amy Chionchio Referring Phys: [id number] Katharyn Pall AMIN IMPRESSIONS  1. Left ventricular ejection fraction, by estimation, is 60 to 65%. The left ventricle has normal function. The left ventricle has no regional wall motion abnormalities. There is mild concentric left ventricular hypertrophy. Left ventricular diastolic parameters are consistent with Grade II diastolic dysfunction (pseudonormalization).  2. Right ventricular systolic function is  hyperdynamic. The right ventricular size is normal. There is moderately elevated pulmonary artery systolic pressure.  3. Left atrial size was moderately dilated.  4. The mitral valve is grossly normal. Trivial mitral valve regurgitation. The mean mitral valve gradient is 3.0 mmHg.  5. Aortic valve calcification . The aortic valve is calcified. Aortic valve regurgitation is not visualized. Severe aortic valve stenosis. Aortic valve area, by VTI measures 0.92 cm. Aortic valve mean gradient measures 42.0 mmHg. Aortic valve Vmax measures 4.13 m/s. Aortic valve acceleration time measures 120 msec.  6. The inferior vena cava is dilated in size with <50% respiratory variability, suggesting right atrial pressure of 15 mmHg. Comparison(s): Prior images unable to be directly viewed, comparison made by report only. In comparison from 2016 report- aortic sclerosis has progressed to severe aortic stenosis. FINDINGS  Left Ventricle: No ventricular strain or 3D. Left ventricular ejection fraction, by estimation, is 60 to 65%. The left ventricle has normal function. The left ventricle has no regional wall motion abnormalities. Strain was  performed and the global longitudinal strain is indeterminate. The left ventricular internal cavity size was normal in size. There is mild concentric left ventricular hypertrophy. Left ventricular diastolic parameters are consistent with Grade II diastolic dysfunction (pseudonormalization). Right Ventricle: The right ventricular size is normal. No increase in right ventricular wall thickness. Right ventricular systolic function is hyperdynamic. There is moderately elevated pulmonary artery systolic pressure. The tricuspid regurgitant velocity is 2.93 m/s, and with an assumed right atrial pressure of 15 mmHg, the estimated right ventricular systolic pressure is 49.3 mmHg. Left Atrium: Left atrial size was moderately dilated. Right Atrium: Right atrial size was normal in size. Pericardium: Trivial  pericardial effusion is present. The pericardial effusion is anterior to the right ventricle. Mitral Valve: The mitral valve is grossly normal. Trivial mitral valve regurgitation. MV peak gradient, 6.7 mmHg. The mean mitral valve gradient is 3.0 mmHg. Tricuspid Valve: The tricuspid valve is normal in structure. Tricuspid valve regurgitation is not demonstrated. No evidence of tricuspid stenosis. Aortic Valve: Aortic valve calcification. The aortic valve is calcified. Aortic valve regurgitation is not visualized. Severe aortic stenosis is present. Aortic valve mean gradient measures 42.0 mmHg. Aortic valve peak gradient measures 68.2 mmHg. Aortic  valve area, by VTI measures 0.92 cm. Pulmonic Valve: The pulmonic valve was grossly normal. Pulmonic valve regurgitation is not visualized. No evidence of pulmonic stenosis. Aorta: The aortic root and ascending aorta are structurally normal, with no evidence of dilitation. Venous: The inferior vena cava is dilated in size with less than 50% respiratory variability, suggesting right atrial pressure of 15 mmHg. IAS/Shunts: The atrial septum is grossly normal. Additional Comments: 3D was performed not requiring image post processing on an independent workstation and was indeterminate.  LEFT VENTRICLE PLAX 2D LVIDd:         4.40 cm      Diastology LVIDs:         3.50 cm      LV e' lateral:   7.18 cm/s LV PW:         1.10 cm      LV E/e' lateral: 15.7 LV IVS:        1.20 cm LVOT diam:     2.00 cm LV SV:         92 LV SV Index:   46 LVOT Area:     3.14 cm  LV Volumes (MOD) LV vol d, MOD A2C: 106.0 ml LV vol d, MOD A4C: 102.0 ml LV vol s, MOD A2C: 43.5 ml LV vol s, MOD A4C: 41.2 ml LV SV MOD A2C:     62.5 ml LV SV MOD A4C:     102.0 ml LV SV MOD BP:      59.7 ml RIGHT VENTRICLE          IVC RV Basal diam:  3.00 cm  IVC diam: 2.70 cm RV Mid diam:    3.00 cm TAPSE (M-mode): 2.5 cm LEFT ATRIUM             Index        RIGHT ATRIUM           Index LA Vol (A2C):   77.7 ml 39.11 ml/m   RA Area:     18.00 cm LA Vol (A4C):   87.5 ml 44.05 ml/m  RA Volume:   47.00 ml  23.66 ml/m LA Biplane Vol: 86.3 ml 43.44 ml/m  AORTIC VALVE AV Area (Vmax):    0.96 cm AV Area (Vmean):   0.93 cm  AV Area (VTI):     0.92 cm AV Vmax:           413.00 cm/s AV Vmean:          298.750 cm/s AV VTI:            0.992 m AV Peak Grad:      68.2 mmHg AV Mean Grad:      42.0 mmHg LVOT Vmax:         126.00 cm/s LVOT Vmean:        88.700 cm/s LVOT VTI:          0.292 m LVOT/AV VTI ratio: 0.29  AORTA Ao Root diam: 2.60 cm Ao Asc diam:  3.20 cm MITRAL VALVE                TRICUSPID VALVE MV Area (PHT): 3.37 cm     TR Peak grad:   34.3 mmHg MV Area VTI:   2.21 cm     TR Vmax:        293.00 cm/s MV Peak grad:  6.7 mmHg MV Mean grad:  3.0 mmHg     SHUNTS MV Vmax:       1.29 m/s     Systemic VTI:  0.29 m MV Vmean:      86.8 cm/s    Systemic Diam: 2.00 cm MV Decel Time: 225 msec MV E velocity: 113.00 cm/s MV A velocity: 93.00 cm/s MV E/A ratio:  1.22 Gloriann Larger MD Electronically signed by Gloriann Larger MD Signature Date/Time: 01/24/2024/12:23:32 PM    Final    DG Chest Portable 1 View Result Date: 01/23/2024 CLINICAL DATA:  low oxygen EXAM: PORTABLE CHEST 1 VIEW COMPARISON:  January 24, 2006 FINDINGS: The cardiomediastinal silhouette is enlarged in contour. Small bilateral pleural effusions. No pneumothorax. Peribronchial cuffing with diffuse interstitial prominence. Several calcific densities project along the contours of the LEFT humerus and glenoid, incompletely assessed IMPRESSION: 1. Constellation of findings are favored to reflect pulmonary edema with small bilateral pleural effusions. 2. Several calcific densities project along the contours of the LEFT humerus and glenoid, incompletely assessed. Recommend dedicated radiographs of the LEFT shoulder. Electronically Signed   By: Clancy Crimes M.D.   On: 01/23/2024 13:35   DG Shoulder Right Result Date: 01/23/2024 CLINICAL DATA:  Possible right  shoulder dislocation. EXAM: RIGHT SHOULDER - 2+ VIEW COMPARISON:  None Available. FINDINGS: There is no evidence of fracture or dislocation. Moderate narrowing of subacromial space is noted. Soft tissues are unremarkable. IMPRESSION: No fracture or dislocation is noted. Moderate narrowing of subacromial space is noted suggesting chronic rotator cuff injury. Electronically Signed   By: Rosalene Colon M.D.   On: 01/23/2024 11:53    Assessment/Plan 1. Numbness and tingling in right hand (Primary) ? Could be due to Carpal Tunnel Right now wants to wait for further work up   2. Tremor Essential tremor Not affecting her much Can use Propanolol PRN if needed  3. Acute pain of right shoulder Doing better  Mostly resolved  Rotator cuff injury Wants to Continue to use Stand up lift 4. Severe aortic stenosis Seen by Cardiology Has decided to wait for now for any intervention  5. Bilateral leg edema Lasix  QD Last Potassium 3.9   Labs/tests ordered:   Next appt:  Visit date not found

## 2024-02-22 ENCOUNTER — Non-Acute Institutional Stay (SKILLED_NURSING_FACILITY): Admitting: Adult Health

## 2024-02-22 ENCOUNTER — Encounter: Payer: Self-pay | Admitting: Adult Health

## 2024-02-22 DIAGNOSIS — Z1389 Encounter for screening for other disorder: Secondary | ICD-10-CM | POA: Diagnosis not present

## 2024-02-22 DIAGNOSIS — E11319 Type 2 diabetes mellitus with unspecified diabetic retinopathy without macular edema: Secondary | ICD-10-CM | POA: Diagnosis not present

## 2024-02-22 DIAGNOSIS — R350 Frequency of micturition: Secondary | ICD-10-CM | POA: Diagnosis not present

## 2024-02-22 DIAGNOSIS — R8290 Unspecified abnormal findings in urine: Secondary | ICD-10-CM | POA: Diagnosis not present

## 2024-02-22 DIAGNOSIS — I5032 Chronic diastolic (congestive) heart failure: Secondary | ICD-10-CM | POA: Diagnosis not present

## 2024-02-22 NOTE — Progress Notes (Signed)
 Location:  [facility] Room Number: 118 A Place of Service:  SNF [phone]) Provider: Raylene Calamity, NP   Patient Care Team: [name], MD as PCP - General (Internal Medicine)  Extended Emergency Contact Information Primary Emergency Contact: trotter,[name] Address: [address]          [place], [place] [address] [place] Home Phone: [phone] Work Phone: [phone] Mobile Phone: [phone] Relation: Daughter  Code Status:  DNR Goals of care: Advanced Directive information    01/23/2024    7:55 PM  Advanced Directives  Type of Advance Directive Out of facility DNR (pink MOST or yellow form)     Chief Complaint  Patient presents with   Acute Visit    HPI:  Pt is a 89 y.o. female seen today for an acute visit for frequency  Previously hospitalized for acute CHF 01/23/24.  Started on daily lasix .   The nurse reports over the weekend Ms L had some urinary frequency and bladder discomfort. PVR was 26 cc. UA C and S sent this am. No frequency at night, mostly during the day.   Dm II: A1C 7.3, CBGs 120-160s  Chronic diastolic CHF: no sob. Chronic edema. Wears hose. Weight up 4 lbs. On lasix  40 mg with large volume urination reported.   01/24/24: 2D echo EF 60-65%, LAE, elevated pulmonary artery pressure. Severe aortic stenosis is noted. Grade 2 DD   BUN 11 Cr 0.4 02/01/24    Past Medical History:  Diagnosis Date   Diabetes (HCC)    HLD (hyperlipidemia)    HTN (hypertension)    Hypercholesteremia    Low back pain    Obesities, morbid (HCC)    Past Surgical History:  Procedure Laterality Date   APPENDECTOMY     TONSILLECTOMY      Allergies  Allergen Reactions   Penicillin G Anaphylaxis    Throat closing up   Arthro-Complex [Nutritional Supplements] Other (See Comments)    Allergy not listed on MAR    Empagliflozin Other (See Comments)    Yeast infection   Levaquin [Levofloxacin In D5w] Other (See  Comments)    Reaction not listed on MAR    Levofloxacin Other (See Comments)    Reaction not listed on MAR    Lipitor [Atorvastatin ] Other (See Comments)    Allergy not listed on MAR    Metformin Hcl Other (See Comments)    Stomach cramps   Terbinafine Other (See Comments)    Reaction not listed on [facility]    Outpatient Encounter Medications as of 02/22/2024  Medication Sig   acetaminophen  (TYLENOL ) 500 MG tablet Take 1,000 mg by mouth 3 (three) times daily as needed (reason not listed on MAR).   atorvastatin  (LIPITOR) 20 MG tablet Take 20 mg by mouth daily.   cholecalciferol  (VITAMIN D ) 1000 UNITS tablet Take 1,000 Units by mouth daily.   diltiazem  (CARDIZEM  CD) 180 MG 24 hr capsule Take 1 capsule (180 mg total) by mouth daily. (Patient taking differently: Take 180 mg by mouth daily. Hold if SBP is <120)   escitalopram  (LEXAPRO ) 10 MG tablet Take 10 mg by mouth daily.   furosemide  (LASIX ) 40 MG tablet Take 40 mg by mouth every morning. 40 mg, oral, once a morning, Lasix , 40 mg, PO q day per new order on 02/01/24   glipiZIDE (GLUCOTROL XL) 2.5 MG 24 hr tablet Take 2.5 mg by mouth every morning.   hydrOXYzine  (ATARAX ) 10 MG tablet Take 1 tablet (10  mg total) by mouth daily as needed. (Patient taking differently: Take 10 mg by mouth as needed for anxiety.)   meloxicam (MOBIC) 7.5 MG tablet Take 7.5 mg by mouth daily as needed for pain.   Multiple Vitamin (MULTIVITAMIN ADULT PO) Take 1 tablet by mouth daily.   OZEMPIC, 0.25 OR 0.5 MG/DOSE, 2 MG/3ML SOPN Inject 0.5 mg into the skin once a week.   potassium chloride  SA (KLOR-CON  M) 20 MEQ tablet Take 20 mEq by mouth daily. 20 meq, oral, once a morning, Potassium, 20meq, PO daily per 02/01/24   Sennosides-Docusate Sodium  (SENNA PLUS PO) Take by mouth daily.   HYDROcodone -acetaminophen  (NORCO/VICODIN) 5-325 MG tablet Take 1 tablet by mouth every 6 (six) hours as needed for moderate pain (pain score 4-6). (Patient not taking: Reported on 02/22/2024)    olmesartan -hydrochlorothiazide (BENICAR  HCT) 40-25 MG tablet Take 1 tablet by mouth daily. (Patient not taking: Reported on 02/22/2024)   No facility-administered encounter medications on file as of 02/22/2024.    Review of Systems  Constitutional:  Negative for activity change, appetite change, chills, diaphoresis, fatigue, fever and unexpected weight change.  HENT:  Negative for congestion.   Respiratory:  Negative for cough, shortness of breath and wheezing.   Cardiovascular:  Positive for leg swelling. Negative for chest pain and palpitations.  Gastrointestinal:  Negative for abdominal distention, abdominal pain, constipation and diarrhea.  Genitourinary:  Positive for frequency, pelvic pain and urgency. Negative for decreased urine volume, difficulty urinating, dysuria, flank pain and hematuria.  Musculoskeletal:  Positive for arthralgias and gait problem. Negative for back pain, joint swelling and myalgias.  Neurological:  Negative for dizziness, tremors, seizures, syncope, facial asymmetry, speech difficulty, weakness, light-headedness, numbness and headaches.  Psychiatric/Behavioral:  Negative for agitation, behavioral problems and confusion.     Immunization History  Administered Date(s) Administered   Fluad Quad(high Dose 65+) 07/28/2022   Fluad Trivalent(High Dose 65+) 12/19/2023   Influenza Split 07/26/2009, 07/31/2010, 08/13/2011, 09/02/2012, 09/20/2013, 08/04/2019, 08/24/2020   Influenza,inj,Quad PF,6+ Mos 07/29/2017   Moderna Sars-Covid-2 Vaccination 11/22/2019, 12/06/2019, 08/31/2020   PNEUMOCOCCAL CONJUGATE-20 11/24/2022   Pfizer(Comirnaty )Fall Seasonal Vaccine 12 years and older 08/12/2022   Pneumococcal Conjugate-13 10/02/2014   Pneumococcal Polysaccharide-23 03/21/2009, 04/22/2017   Td 03/21/2009   Tdap 08/18/2019   Zoster, Live 03/15/2013, 09/13/2019, 03/06/2020   Pertinent  Health Maintenance Due  Topic Date Due   OPHTHALMOLOGY EXAM  Never done   INFLUENZA  VACCINE  05/27/2024   HEMOGLOBIN A1C  06/09/2024   FOOT EXAM  08/25/2024   DEXA SCAN  Discontinued      10/19/2023   11:34 AM  Fall Risk  Falls in the past year? 0  Was there an injury with Fall? 0  Fall Risk Category Calculator 0  Patient at Risk for Falls Due to No Fall Risks  Fall risk Follow up Falls evaluation completed   Functional Status Survey:    Vitals:   02/22/24 1002  BP: 139/66  Pulse: 63  Resp: 18  Temp: (!) 96.8 F (36 C)  SpO2: 94%  Weight: 205 lb 3.2 oz (93.1 kg)  Height: 5\' 3"  (1.6 m)   Body mass index is 36.35 kg/m. Wt Readings from Last 3 Encounters:  02/22/24 205 lb 3.2 oz (93.1 kg)  02/09/24 201 lb (91.2 kg)  02/02/24 201 lb (91.2 kg)    Physical Exam Vitals and nursing note reviewed.  Constitutional:      General: She is not in acute distress.    Appearance: Normal appearance. She  is not diaphoretic.  HENT:     Head: Normocephalic and atraumatic.     Nose: No congestion.  Neck:     Thyroid: No thyromegaly.     Vascular: No carotid bruit or JVD.  Cardiovascular:     Rate and Rhythm: Normal rate and regular rhythm.     Heart sounds: Murmur heard.  Pulmonary:     Effort: Pulmonary effort is normal. No respiratory distress.     Breath sounds: Normal breath sounds. No stridor.  Abdominal:     General: Bowel sounds are normal. There is no distension.     Palpations: Abdomen is soft.     Tenderness: There is no abdominal tenderness. There is no right CVA tenderness or left CVA tenderness.  Musculoskeletal:     Cervical back: No rigidity. No muscular tenderness.     Right lower leg: Edema (+1) present.     Left lower leg: Edema (+1) present.  Lymphadenopathy:     Cervical: No cervical adenopathy.  Skin:    General: Skin is warm and dry.  Neurological:     General: No focal deficit present.     Mental Status: She is alert and oriented to person, place, and time. Mental status is at baseline.     Cranial Nerves: No cranial nerve  deficit.  Psychiatric:        Mood and Affect: Mood normal.     Labs reviewed: Recent Labs    01/23/24 1247 01/24/24 0333 01/24/24 1431 02/01/24 0000  NA 140 140  --  142  K 4.0 3.1* 3.4* 3.9  CL 107 105  --  107  CO2 27 28  --  26*  GLUCOSE 129* 115*  --   --   BUN 13 12  --  11  CREATININE 0.43* 0.52  --  0.4*  CALCIUM  8.8* 8.3*  --  8.7   Recent Labs    01/23/24 1247  AST 30  ALT 15  ALKPHOS 105  BILITOT 1.7*  PROT 5.8*  ALBUMIN 2.7*   Recent Labs    01/23/24 1247 01/24/24 0333 01/29/24 0000 02/01/24 0000  WBC 9.7 9.9 8.4 8.0  NEUTROABS 5.6  --   --   --   HGB 11.7* 11.0* 11.6* 11.3*  HCT 35.5* 33.2* 35* 34*  MCV 92.7 92.7  --   --   PLT 291 291 312 316   No results found for: "TSH" Lab Results  Component Value Date   HGBA1C 7.3 12/11/2023   Lab Results  Component Value Date   CHOL 157 12/11/2023   HDL 72 (A) 12/11/2023   LDLCALC 72 12/11/2023   TRIG 66 12/11/2023    Significant Diagnostic Results in last 30 days:  ECHOCARDIOGRAM COMPLETE Result Date: 01/24/2024    ECHOCARDIOGRAM REPORT   Patient Name:   Patricia Petersen Date of Exam: 01/24/2024 Medical Rec #:  [id number]       Height:       65.0 in Accession #:    [id number]      Weight:       202.0 lb Date of Birth:  19-May-1934       BSA:          1.987 m Patient Age:    89 years        BP:           133/63 mmHg Patient Gender: F               HR:  68 bpm. Exam Location:  Inpatient Procedure: 2D Echo, Cardiac Doppler and Color Doppler (Both Spectral and Color            Flow Doppler were utilized during procedure). Indications:    Cardiomyopathy  History:        Patient has prior history of Echocardiogram examinations, most                 recent 09/05/2015. CHF, PAD; Risk Factors:Hypertension and                 Diabetes.  Sonographer:    Amy Chionchio Referring Phys: [id number] Katharyn Pall AMIN IMPRESSIONS  1. Left ventricular ejection fraction, by estimation, is 60 to 65%. The left ventricle has  normal function. The left ventricle has no regional wall motion abnormalities. There is mild concentric left ventricular hypertrophy. Left ventricular diastolic parameters are consistent with Grade II diastolic dysfunction (pseudonormalization).  2. Right ventricular systolic function is hyperdynamic. The right ventricular size is normal. There is moderately elevated pulmonary artery systolic pressure.  3. Left atrial size was moderately dilated.  4. The mitral valve is grossly normal. Trivial mitral valve regurgitation. The mean mitral valve gradient is 3.0 mmHg.  5. Aortic valve calcification . The aortic valve is calcified. Aortic valve regurgitation is not visualized. Severe aortic valve stenosis. Aortic valve area, by VTI measures 0.92 cm. Aortic valve mean gradient measures 42.0 mmHg. Aortic valve Vmax measures 4.13 m/s. Aortic valve acceleration time measures 120 msec.  6. The inferior vena cava is dilated in size with <50% respiratory variability, suggesting right atrial pressure of 15 mmHg. Comparison(s): Prior images unable to be directly viewed, comparison made by report only. In comparison from 2016 report- aortic sclerosis has progressed to severe aortic stenosis. FINDINGS  Left Ventricle: No ventricular strain or 3D. Left ventricular ejection fraction, by estimation, is 60 to 65%. The left ventricle has normal function. The left ventricle has no regional wall motion abnormalities. Strain was performed and the global longitudinal strain is indeterminate. The left ventricular internal cavity size was normal in size. There is mild concentric left ventricular hypertrophy. Left ventricular diastolic parameters are consistent with Grade II diastolic dysfunction (pseudonormalization). Right Ventricle: The right ventricular size is normal. No increase in right ventricular wall thickness. Right ventricular systolic function is hyperdynamic. There is moderately elevated pulmonary artery systolic pressure. The  tricuspid regurgitant velocity is 2.93 m/s, and with an assumed right atrial pressure of 15 mmHg, the estimated right ventricular systolic pressure is 49.3 mmHg. Left Atrium: Left atrial size was moderately dilated. Right Atrium: Right atrial size was normal in size. Pericardium: Trivial pericardial effusion is present. The pericardial effusion is anterior to the right ventricle. Mitral Valve: The mitral valve is grossly normal. Trivial mitral valve regurgitation. MV peak gradient, 6.7 mmHg. The mean mitral valve gradient is 3.0 mmHg. Tricuspid Valve: The tricuspid valve is normal in structure. Tricuspid valve regurgitation is not demonstrated. No evidence of tricuspid stenosis. Aortic Valve: Aortic valve calcification. The aortic valve is calcified. Aortic valve regurgitation is not visualized. Severe aortic stenosis is present. Aortic valve mean gradient measures 42.0 mmHg. Aortic valve peak gradient measures 68.2 mmHg. Aortic  valve area, by VTI measures 0.92 cm. Pulmonic Valve: The pulmonic valve was grossly normal. Pulmonic valve regurgitation is not visualized. No evidence of pulmonic stenosis. Aorta: The aortic root and ascending aorta are structurally normal, with no evidence of dilitation. Venous: The inferior vena cava is dilated in size with less than  50% respiratory variability, suggesting right atrial pressure of 15 mmHg. IAS/Shunts: The atrial septum is grossly normal. Additional Comments: 3D was performed not requiring image post processing on an independent workstation and was indeterminate.  LEFT VENTRICLE PLAX 2D LVIDd:         4.40 cm      Diastology LVIDs:         3.50 cm      LV e' lateral:   7.18 cm/s LV PW:         1.10 cm      LV E/e' lateral: 15.7 LV IVS:        1.20 cm LVOT diam:     2.00 cm LV SV:         92 LV SV Index:   46 LVOT Area:     3.14 cm  LV Volumes (MOD) LV vol d, MOD A2C: 106.0 ml LV vol d, MOD A4C: 102.0 ml LV vol s, MOD A2C: 43.5 ml LV vol s, MOD A4C: 41.2 ml LV SV MOD  A2C:     62.5 ml LV SV MOD A4C:     102.0 ml LV SV MOD BP:      59.7 ml RIGHT VENTRICLE          IVC RV Basal diam:  3.00 cm  IVC diam: 2.70 cm RV Mid diam:    3.00 cm TAPSE (M-mode): 2.5 cm LEFT ATRIUM             Index        RIGHT ATRIUM           Index LA Vol (A2C):   77.7 ml 39.11 ml/m  RA Area:     18.00 cm LA Vol (A4C):   87.5 ml 44.05 ml/m  RA Volume:   47.00 ml  23.66 ml/m LA Biplane Vol: 86.3 ml 43.44 ml/m  AORTIC VALVE AV Area (Vmax):    0.96 cm AV Area (Vmean):   0.93 cm AV Area (VTI):     0.92 cm AV Vmax:           413.00 cm/s AV Vmean:          298.750 cm/s AV VTI:            0.992 m AV Peak Grad:      68.2 mmHg AV Mean Grad:      42.0 mmHg LVOT Vmax:         126.00 cm/s LVOT Vmean:        88.700 cm/s LVOT VTI:          0.292 m LVOT/AV VTI ratio: 0.29  AORTA Ao Root diam: 2.60 cm Ao Asc diam:  3.20 cm MITRAL VALVE                TRICUSPID VALVE MV Area (PHT): 3.37 cm     TR Peak grad:   34.3 mmHg MV Area VTI:   2.21 cm     TR Vmax:        293.00 cm/s MV Peak grad:  6.7 mmHg MV Mean grad:  3.0 mmHg     SHUNTS MV Vmax:       1.29 m/s     Systemic VTI:  0.29 m MV Vmean:      86.8 cm/s    Systemic Diam: 2.00 cm MV Decel Time: 225 msec MV E velocity: 113.00 cm/s MV A velocity: 93.00 cm/s MV E/A ratio:  1.22 Gloriann Larger MD Electronically signed by Arneta Beverage  Chandrasekhar MD Signature Date/Time: 01/24/2024/12:23:32 PM    Final    DG Chest Portable 1 View Result Date: 01/23/2024 CLINICAL DATA:  low oxygen EXAM: PORTABLE CHEST 1 VIEW COMPARISON:  January 24, 2006 FINDINGS: The cardiomediastinal silhouette is enlarged in contour. Small bilateral pleural effusions. No pneumothorax. Peribronchial cuffing with diffuse interstitial prominence. Several calcific densities project along the contours of the LEFT humerus and glenoid, incompletely assessed IMPRESSION: 1. Constellation of findings are favored to reflect pulmonary edema with small bilateral pleural effusions. 2. Several calcific densities  project along the contours of the LEFT humerus and glenoid, incompletely assessed. Recommend dedicated radiographs of the LEFT shoulder. Electronically Signed   By: Clancy Crimes M.D.   On: 01/23/2024 13:35   DG Shoulder Right Result Date: 01/23/2024 CLINICAL DATA:  Possible right shoulder dislocation. EXAM: RIGHT SHOULDER - 2+ VIEW COMPARISON:  None Available. FINDINGS: There is no evidence of fracture or dislocation. Moderate narrowing of subacromial space is noted. Soft tissues are unremarkable. IMPRESSION: No fracture or dislocation is noted. Moderate narrowing of subacromial space is noted suggesting chronic rotator cuff injury. Electronically Signed   By: Rosalene Colon M.D.   On: 01/23/2024 11:53    Assessment/Plan  1. Urinary frequency (Primary) With associated pelvic pain now subsided PVR 26  UA C and S sent by nurse and pending Recommend life style mods such as reduced fluid intake, limit caffeine  2. 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 Chronic diastolic (congestive) heart failure (HCC) Compensated Continue lasix  40 mg daily  Followed by cardiology   Family/ staff Communication: resident and nurse   Labs/tests ordered:  repeat labs at f/u visit

## 2024-02-24 ENCOUNTER — Other Ambulatory Visit: Payer: Self-pay | Admitting: Orthopedic Surgery

## 2024-02-24 DIAGNOSIS — N3000 Acute cystitis without hematuria: Secondary | ICD-10-CM

## 2024-02-24 MED ORDER — NITROFURANTOIN MONOHYD MACRO 100 MG PO CAPS: 100.0000 mg | ORAL_CAPSULE | Freq: Two times a day (BID) | ORAL | Status: AC

## 2024-02-24 NOTE — Progress Notes (Signed)
 Urine culture > 100,000 cfu/mL gram negative rods.

## 2024-03-09 ENCOUNTER — Ambulatory Visit: Admitting: Cardiology

## 2024-03-10 ENCOUNTER — Non-Acute Institutional Stay (SKILLED_NURSING_FACILITY): Payer: Self-pay | Admitting: Adult Health

## 2024-03-10 DIAGNOSIS — I10 Essential (primary) hypertension: Secondary | ICD-10-CM

## 2024-03-10 DIAGNOSIS — I5032 Chronic diastolic (congestive) heart failure: Secondary | ICD-10-CM

## 2024-03-10 DIAGNOSIS — R413 Other amnesia: Secondary | ICD-10-CM | POA: Diagnosis not present

## 2024-03-10 DIAGNOSIS — E113293 Type 2 diabetes mellitus with mild nonproliferative diabetic retinopathy without macular edema, bilateral: Secondary | ICD-10-CM | POA: Diagnosis not present

## 2024-03-10 DIAGNOSIS — I350 Nonrheumatic aortic (valve) stenosis: Secondary | ICD-10-CM | POA: Diagnosis not present

## 2024-03-10 DIAGNOSIS — I872 Venous insufficiency (chronic) (peripheral): Secondary | ICD-10-CM | POA: Diagnosis not present

## 2024-03-10 DIAGNOSIS — E7800 Pure hypercholesterolemia, unspecified: Secondary | ICD-10-CM | POA: Diagnosis not present

## 2024-03-11 ENCOUNTER — Encounter: Payer: Self-pay | Admitting: Adult Health

## 2024-03-11 DIAGNOSIS — I350 Nonrheumatic aortic (valve) stenosis: Secondary | ICD-10-CM | POA: Insufficient documentation

## 2024-03-11 DIAGNOSIS — I5032 Chronic diastolic (congestive) heart failure: Secondary | ICD-10-CM | POA: Insufficient documentation

## 2024-03-11 DIAGNOSIS — R413 Other amnesia: Secondary | ICD-10-CM | POA: Insufficient documentation

## 2024-03-11 HISTORY — DX: Other amnesia: R41.3

## 2024-03-11 NOTE — Assessment & Plan Note (Signed)
 Continue lasix  daily Monitor weights Compression hose Low sodium diet  Elevation of legs

## 2024-03-11 NOTE — Assessment & Plan Note (Signed)
 LDL at goal.  Continue statin.

## 2024-03-11 NOTE — Assessment & Plan Note (Signed)
 Short term memory loss noted Recommend socialization and stimulation which she does well with and has a supportive family

## 2024-03-11 NOTE — Assessment & Plan Note (Signed)
 Controlled  Continue lasix 

## 2024-03-11 NOTE — Assessment & Plan Note (Signed)
 Severe with loud murmur No current symptoms Followed by cardiology

## 2024-03-11 NOTE — Progress Notes (Signed)
 Location:  Medical illustrator of Service:  SNF (31) Provider:   Janace Mckusick, ANP [facility] [phone]   Patricia Shiley, MD  Patient Care Team: Patricia Shiley, MD as PCP - General (Internal Medicine)  Extended Emergency Contact Information Primary Emergency Contact: trotter,[name] Address: [address]          [place], [place] [address] [place] Home Phone: [phone] Work Phone: [phone] Mobile Phone: [phone] Relation: Daughter  Code Status:  DNR Goals of care: Advanced Directive information    01/23/2024    7:55 PM  Advanced Directives  Type of Advance Directive Out of facility DNR (pink MOST or yellow form)     Chief Complaint  Patient presents with   Medical Management of Chronic Issues    HPI:  Pt is a 89 y.o. female seen today for medical management of chronic diseases.    Previously hospitalized for acute CHF 01/23/24.  Started on daily lasix . Symptoms improved.   Has right shoulder subluxation with bicep tendonitis. Saw ortho 4/9.  Has worked with therapy regarding exercises. Uses a lift for transfers. Motorized scooter. No acute complaints.   Chronic knee OA failed injections steroid and gel which led to non ambulatory status  Treated for Ecoli UTI 02/14/24 no current symptoms.   Memory loss; MMSE 24/30 01/28/24  Dm II: A1C 7.3 12/11/23, CBGs 100-133. On ozempic and glipizide Went to podiatry 02/09/24 Has eye apt 03/17/24  Chronic diastolic CHF: no sob. Chronic edema. Wears hose. Weight stable. On lasix  40 mg with large volume urination reported.  Wt Readings from Last 3 Encounters:  03/11/24 203 lb 6.4 oz (92.3 kg)  02/22/24 205 lb 3.2 oz (93.1 kg)  02/09/24 201 lb (91.2 kg)     01/24/24: 2D echo EF 60-65%, LAE, elevated pulmonary artery pressure. Severe aortic stenosis is noted. Grade 2 DD   BUN 11 Cr 0.4 02/01/24  HLD LDL 72 12/11/23   Past Medical History:  Diagnosis Date    Diabetes (HCC)    HLD (hyperlipidemia)    HTN (hypertension)    Hypercholesteremia    Low back pain    Obesities, morbid (HCC)    Past Surgical History:  Procedure Laterality Date   APPENDECTOMY     TONSILLECTOMY      Allergies  Allergen Reactions   Penicillin G Anaphylaxis    Throat closing up   Arthro-Complex [Nutritional Supplements] Other (See Comments)    Allergy not listed on MAR    Empagliflozin Other (See Comments)    Yeast infection   Levaquin [Levofloxacin In D5w] Other (See Comments)    Reaction not listed on MAR    Levofloxacin Other (See Comments)    Reaction not listed on MAR    Lipitor [Atorvastatin ] Other (See Comments)    Allergy not listed on MAR    Metformin Hcl Other (See Comments)    Stomach cramps   Terbinafine Other (See Comments)    Reaction not listed on [facility]    Outpatient Encounter Medications as of 03/10/2024  Medication Sig   acetaminophen  (TYLENOL ) 500 MG tablet Take 1,000 mg by mouth 3 (three) times daily as needed (reason not listed on MAR).   atorvastatin  (LIPITOR) 20 MG tablet Take 20 mg by mouth daily.   cholecalciferol  (VITAMIN D ) 1000 UNITS tablet Take 1,000 Units by mouth daily.   diltiazem  (CARDIZEM  CD) 180 MG 24 hr capsule Take 1 capsule (180 mg total) by mouth daily. (Patient  taking differently: Take 180 mg by mouth daily. Hold if SBP is <120)   escitalopram  (LEXAPRO ) 10 MG tablet Take 10 mg by mouth daily.   furosemide  (LASIX ) 40 MG tablet Take 40 mg by mouth every morning. 40 mg, oral, once a morning, Lasix , 40 mg, PO q day per new order on 02/01/24   glipiZIDE (GLUCOTROL XL) 2.5 MG 24 hr tablet Take 2.5 mg by mouth every morning.   hydrOXYzine  (ATARAX ) 10 MG tablet Take 1 tablet (10 mg total) by mouth daily as needed. (Patient taking differently: Take 10 mg by mouth as needed for anxiety.)   meloxicam (MOBIC) 7.5 MG tablet Take 7.5 mg by mouth daily as needed for pain.   Multiple Vitamin (MULTIVITAMIN ADULT PO) Take 1 tablet by mouth  daily.   OZEMPIC, 0.25 OR 0.5 MG/DOSE, 2 MG/3ML SOPN Inject 0.5 mg into the skin once a week.   potassium chloride  SA (KLOR-CON  M) 20 MEQ tablet Take 20 mEq by mouth daily. 20 meq, oral, once a morning, Potassium, 20meq, PO daily per 02/01/24   Sennosides-Docusate Sodium  (SENNA PLUS PO) Take 1 tablet by mouth daily as needed.   [DISCONTINUED] HYDROcodone -acetaminophen  (NORCO/VICODIN) 5-325 MG tablet Take 1 tablet by mouth every 6 (six) hours as needed for moderate pain (pain score 4-6). (Patient not taking: Reported on 02/22/2024)   [DISCONTINUED] olmesartan -hydrochlorothiazide (BENICAR  HCT) 40-25 MG tablet Take 1 tablet by mouth daily. (Patient not taking: Reported on 02/22/2024)   No facility-administered encounter medications on file as of 03/10/2024.    Review of Systems  Constitutional:  Negative for activity change, appetite change, chills, diaphoresis, fatigue, fever and unexpected weight change.  HENT:  Negative for congestion.   Respiratory:  Negative for cough, shortness of breath and wheezing.   Cardiovascular:  Positive for leg swelling. Negative for chest pain and palpitations.  Gastrointestinal:  Negative for abdominal distention, abdominal pain, constipation and diarrhea.  Genitourinary:  Negative for difficulty urinating and dysuria.  Musculoskeletal:  Positive for gait problem. Negative for arthralgias, back pain, joint swelling and myalgias.  Neurological:  Negative for dizziness, tremors, seizures, syncope, facial asymmetry, speech difficulty, weakness, light-headedness, numbness and headaches.  Psychiatric/Behavioral:  Negative for agitation, behavioral problems and confusion.     Immunization History  Administered Date(s) Administered   Fluad Quad(high Dose 65+) 07/28/2022   Fluad Trivalent(High Dose 65+) 12/19/2023   Influenza Split 07/26/2009, 07/31/2010, 08/13/2011, 09/02/2012, 09/20/2013, 08/04/2019, 08/24/2020   Influenza,inj,Quad PF,6+ Mos 07/29/2017   Moderna  Sars-Covid-2 Vaccination 11/22/2019, 12/06/2019, 08/31/2020   PNEUMOCOCCAL CONJUGATE-20 11/24/2022   Pfizer(Comirnaty )Fall Seasonal Vaccine 12 years and older 08/12/2022   Pneumococcal Conjugate-13 10/02/2014   Pneumococcal Polysaccharide-23 03/21/2009, 04/22/2017   Td 03/21/2009   Tdap 08/18/2019   Zoster, Live 03/15/2013, 09/13/2019, 03/06/2020   Pertinent  Health Maintenance Due  Topic Date Due   OPHTHALMOLOGY EXAM  Never done   INFLUENZA VACCINE  05/27/2024   HEMOGLOBIN A1C  06/09/2024   FOOT EXAM  08/25/2024   DEXA SCAN  Discontinued      10/19/2023   11:34 AM  Fall Risk  Falls in the past year? 0  Was there an injury with Fall? 0  Fall Risk Category Calculator 0  Patient at Risk for Falls Due to No Fall Risks  Fall risk Follow up Falls evaluation completed   Functional Status Survey:    Vitals:   03/11/24 1041  BP: 128/67  Pulse: 82  Resp: 17  Temp: 97.9 F (36.6 C)  SpO2: 94%  Weight: 203  lb 6.4 oz (92.3 kg)   Body mass index is 36.03 kg/m. Physical Exam Vitals and nursing note reviewed.  Constitutional:      General: She is not in acute distress.    Appearance: Normal appearance. She is not diaphoretic.  HENT:     Head: Normocephalic and atraumatic.     Nose: No congestion.  Neck:     Thyroid: No thyromegaly.     Vascular: No carotid bruit or JVD.  Cardiovascular:     Rate and Rhythm: Normal rate and regular rhythm.     Heart sounds: Murmur heard.  Pulmonary:     Effort: Pulmonary effort is normal. No respiratory distress.     Breath sounds: Normal breath sounds. No stridor.  Abdominal:     General: Bowel sounds are normal. There is no distension.     Palpations: Abdomen is soft.     Tenderness: There is no abdominal tenderness. There is no left CVA tenderness.  Musculoskeletal:     Cervical back: No rigidity. No muscular tenderness.     Right lower leg: Edema (+2) present.     Left lower leg: Edema (+2) present.  Lymphadenopathy:      Cervical: No cervical adenopathy.  Skin:    General: Skin is warm and dry.  Neurological:     General: No focal deficit present.     Mental Status: She is alert and oriented to person, place, and time. Mental status is at baseline.  Psychiatric:        Mood and Affect: Mood normal.     Labs reviewed: Recent Labs    01/23/24 1247 01/24/24 0333 01/24/24 1431 02/01/24 0000  NA 140 140  --  142  K 4.0 3.1* 3.4* 3.9  CL 107 105  --  107  CO2 27 28  --  26*  GLUCOSE 129* 115*  --   --   BUN 13 12  --  11  CREATININE 0.43* 0.52  --  0.4*  CALCIUM  8.8* 8.3*  --  8.7   Recent Labs    01/23/24 1247  AST 30  ALT 15  ALKPHOS 105  BILITOT 1.7*  PROT 5.8*  ALBUMIN 2.7*   Recent Labs    01/23/24 1247 01/24/24 0333 01/29/24 0000 02/01/24 0000  WBC 9.7 9.9 8.4 8.0  NEUTROABS 5.6  --   --   --   HGB 11.7* 11.0* 11.6* 11.3*  HCT 35.5* 33.2* 35* 34*  MCV 92.7 92.7  --   --   PLT 291 291 312 316   No results found for: "TSH" Lab Results  Component Value Date   HGBA1C 7.3 12/11/2023   Lab Results  Component Value Date   CHOL 157 12/11/2023   HDL 72 (A) 12/11/2023   LDLCALC 72 12/11/2023   TRIG 66 12/11/2023    Significant Diagnostic Results in last 30 days:  No results found.  Assessment/Plan  Type 2 diabetes mellitus with mild nonproliferative diabetic retinopathy without macular edema, bilateral (HCC) Continue current medications F/u with ophthalmology as scheduled A1C due at next visit.  Hypercholesterolemia LDL at goal Continue statin  Essential hypertension Controlled  Continue lasix   Chronic diastolic (congestive) heart failure (HCC) Continue lasix  daily Monitor weights Compression hose Low sodium diet  Elevation of legs  Peripheral venous insufficiency Compression hose Continue lasix .   Aortic stenosis Severe with loud murmur No current symptoms Followed by cardiology  Memory loss Short term memory loss noted Recommend socialization  and stimulation which  she does well with and has a supportive family

## 2024-03-11 NOTE — Assessment & Plan Note (Signed)
 Continue current medications F/u with ophthalmology as scheduled A1C due at next visit.

## 2024-03-11 NOTE — Assessment & Plan Note (Signed)
 Compression hose Continue lasix .

## 2024-03-16 DIAGNOSIS — L820 Inflamed seborrheic keratosis: Secondary | ICD-10-CM | POA: Diagnosis not present

## 2024-03-16 DIAGNOSIS — L821 Other seborrheic keratosis: Secondary | ICD-10-CM | POA: Diagnosis not present

## 2024-03-16 DIAGNOSIS — L814 Other melanin hyperpigmentation: Secondary | ICD-10-CM | POA: Diagnosis not present

## 2024-03-24 DIAGNOSIS — S43301A Subluxation of unspecified parts of right shoulder girdle, initial encounter: Secondary | ICD-10-CM | POA: Diagnosis not present

## 2024-03-24 DIAGNOSIS — R278 Other lack of coordination: Secondary | ICD-10-CM | POA: Diagnosis not present

## 2024-03-29 DIAGNOSIS — R278 Other lack of coordination: Secondary | ICD-10-CM | POA: Diagnosis not present

## 2024-03-29 DIAGNOSIS — S43301A Subluxation of unspecified parts of right shoulder girdle, initial encounter: Secondary | ICD-10-CM | POA: Diagnosis not present

## 2024-04-01 DIAGNOSIS — S43301A Subluxation of unspecified parts of right shoulder girdle, initial encounter: Secondary | ICD-10-CM | POA: Diagnosis not present

## 2024-04-01 DIAGNOSIS — R278 Other lack of coordination: Secondary | ICD-10-CM | POA: Diagnosis not present

## 2024-04-04 DIAGNOSIS — R278 Other lack of coordination: Secondary | ICD-10-CM | POA: Diagnosis not present

## 2024-04-04 DIAGNOSIS — S43301A Subluxation of unspecified parts of right shoulder girdle, initial encounter: Secondary | ICD-10-CM | POA: Diagnosis not present

## 2024-04-05 ENCOUNTER — Encounter: Payer: Self-pay | Admitting: Internal Medicine

## 2024-04-05 DIAGNOSIS — S43301A Subluxation of unspecified parts of right shoulder girdle, initial encounter: Secondary | ICD-10-CM | POA: Diagnosis not present

## 2024-04-05 DIAGNOSIS — R278 Other lack of coordination: Secondary | ICD-10-CM | POA: Diagnosis not present

## 2024-04-11 DIAGNOSIS — S43301A Subluxation of unspecified parts of right shoulder girdle, initial encounter: Secondary | ICD-10-CM | POA: Diagnosis not present

## 2024-04-11 DIAGNOSIS — R278 Other lack of coordination: Secondary | ICD-10-CM | POA: Diagnosis not present

## 2024-04-13 DIAGNOSIS — L821 Other seborrheic keratosis: Secondary | ICD-10-CM | POA: Diagnosis not present

## 2024-04-13 DIAGNOSIS — L814 Other melanin hyperpigmentation: Secondary | ICD-10-CM | POA: Diagnosis not present

## 2024-04-13 DIAGNOSIS — S43301A Subluxation of unspecified parts of right shoulder girdle, initial encounter: Secondary | ICD-10-CM | POA: Diagnosis not present

## 2024-04-13 DIAGNOSIS — R278 Other lack of coordination: Secondary | ICD-10-CM | POA: Diagnosis not present

## 2024-04-13 DIAGNOSIS — L820 Inflamed seborrheic keratosis: Secondary | ICD-10-CM | POA: Diagnosis not present

## 2024-04-15 DIAGNOSIS — R278 Other lack of coordination: Secondary | ICD-10-CM | POA: Diagnosis not present

## 2024-04-15 DIAGNOSIS — S43301A Subluxation of unspecified parts of right shoulder girdle, initial encounter: Secondary | ICD-10-CM | POA: Diagnosis not present

## 2024-04-18 DIAGNOSIS — R278 Other lack of coordination: Secondary | ICD-10-CM | POA: Diagnosis not present

## 2024-04-18 DIAGNOSIS — S43301A Subluxation of unspecified parts of right shoulder girdle, initial encounter: Secondary | ICD-10-CM | POA: Diagnosis not present

## 2024-04-20 DIAGNOSIS — R278 Other lack of coordination: Secondary | ICD-10-CM | POA: Diagnosis not present

## 2024-04-20 DIAGNOSIS — S43301A Subluxation of unspecified parts of right shoulder girdle, initial encounter: Secondary | ICD-10-CM | POA: Diagnosis not present

## 2024-04-22 DIAGNOSIS — S43301A Subluxation of unspecified parts of right shoulder girdle, initial encounter: Secondary | ICD-10-CM | POA: Diagnosis not present

## 2024-04-22 DIAGNOSIS — R278 Other lack of coordination: Secondary | ICD-10-CM | POA: Diagnosis not present

## 2024-04-25 DIAGNOSIS — R278 Other lack of coordination: Secondary | ICD-10-CM | POA: Diagnosis not present

## 2024-04-25 DIAGNOSIS — S43301A Subluxation of unspecified parts of right shoulder girdle, initial encounter: Secondary | ICD-10-CM | POA: Diagnosis not present

## 2024-04-27 DIAGNOSIS — R278 Other lack of coordination: Secondary | ICD-10-CM | POA: Diagnosis not present

## 2024-04-27 DIAGNOSIS — S43301A Subluxation of unspecified parts of right shoulder girdle, initial encounter: Secondary | ICD-10-CM | POA: Diagnosis not present

## 2024-05-02 DIAGNOSIS — S43301A Subluxation of unspecified parts of right shoulder girdle, initial encounter: Secondary | ICD-10-CM | POA: Diagnosis not present

## 2024-05-02 DIAGNOSIS — R278 Other lack of coordination: Secondary | ICD-10-CM | POA: Diagnosis not present

## 2024-05-03 DIAGNOSIS — R278 Other lack of coordination: Secondary | ICD-10-CM | POA: Diagnosis not present

## 2024-05-03 DIAGNOSIS — S43301A Subluxation of unspecified parts of right shoulder girdle, initial encounter: Secondary | ICD-10-CM | POA: Diagnosis not present

## 2024-05-04 DIAGNOSIS — S43301A Subluxation of unspecified parts of right shoulder girdle, initial encounter: Secondary | ICD-10-CM | POA: Diagnosis not present

## 2024-05-04 DIAGNOSIS — R278 Other lack of coordination: Secondary | ICD-10-CM | POA: Diagnosis not present

## 2024-05-06 DIAGNOSIS — S43301A Subluxation of unspecified parts of right shoulder girdle, initial encounter: Secondary | ICD-10-CM | POA: Diagnosis not present

## 2024-05-06 DIAGNOSIS — R278 Other lack of coordination: Secondary | ICD-10-CM | POA: Diagnosis not present

## 2024-05-08 NOTE — Progress Notes (Unsigned)
  Cardiology Office Note:   Date:  05/10/2024  ID:  Patricia Petersen, DOB July 11, 1934, MRN [id number] PCP: Charlanne Fredia CROME, MD  [facility] Providers Cardiologist:  None {  History of Present Illness:   Patricia Petersen is a 89 y.o. female who presents for evaluation of aortic stenosis.  Echo in March 2025 demonstrated an EF of 60 - 65%.  There is moderately elevated pulmonary pressures.  AS was severe with a gradient of 42 mm and VTI of 0.92.  She was in the hospital with acute on chronic congestive heart failure.  She lives at [facility].  I saw her previously for a murmur.    Echo in March demonstrated an EF of 65% with severe AS with a mean gradient of 42 and VTI of  0.92.    The patient's had no new symptoms since she was last seen.  [facility]  She really is wheelchair-bound.  She feels well. The patient denies any new symptoms such as chest discomfort, neck or arm discomfort. There has been no new shortness of breath, PND or orthopnea. There have been no reported palpitations, presyncope or syncope.    ROS: As stated in the HPI and negative for all other systems.  Studies Reviewed:    EKG:     NA  Risk Assessment/Calculations:              Physical Exam:   VS:  BP (!) 130/56 (BP Location: Left Arm, Patient Position: Sitting, Cuff Size: Normal)   Pulse (!) 57   Ht 5' 3 (1.6 m)   Wt 196 lb (88.9 kg)   SpO2 96%   BMI 34.72 kg/m    Wt Readings from Last 3 Encounters:  05/10/24 196 lb (88.9 kg)  03/11/24 203 lb 6.4 oz (92.3 kg)  02/22/24 205 lb 3.2 oz (93.1 kg)     GEN: Well nourished, well developed in no acute distress NECK: No JVD; No carotid bruits CARDIAC: RRR, 3 out of 6 late peaking systolic murmur radiating at the aortic outflow tract, no diastolic murmurs, rubs, gallops RESPIRATORY:  Clear to auscultation without rales, wheezing or rhonchi  ABDOMEN: Soft, non-tender, non-distended EXTREMITIES:  Moderate to severe  leg edema; No deformity   ASSESSMENT AND PLAN:   Aortic stenosis: The patient has no new symptoms.  She wants conservative therapy.  No change in therapy.  I will check a basic metabolic profile today.  She is doing well with the current diuretic.  In fact her weight at her center seems to be going down.   Edema: She does have dependent lower extremity swelling but her feet are on the ground quite a bit.  This is uncomfortable for her and has decreased.  No change in therapy.  With APP in 6 months    Follow up with APP in six months.   Signed, Lynwood Schilling, MD

## 2024-05-09 DIAGNOSIS — R278 Other lack of coordination: Secondary | ICD-10-CM | POA: Diagnosis not present

## 2024-05-09 DIAGNOSIS — S43301A Subluxation of unspecified parts of right shoulder girdle, initial encounter: Secondary | ICD-10-CM | POA: Diagnosis not present

## 2024-05-10 ENCOUNTER — Ambulatory Visit: Attending: Cardiology | Admitting: Cardiology

## 2024-05-10 ENCOUNTER — Encounter: Payer: Self-pay | Admitting: Cardiology

## 2024-05-10 VITALS — BP 130/56 | HR 57 | Ht 63.0 in | Wt 196.0 lb

## 2024-05-10 DIAGNOSIS — I10 Essential (primary) hypertension: Secondary | ICD-10-CM | POA: Diagnosis not present

## 2024-05-10 DIAGNOSIS — R278 Other lack of coordination: Secondary | ICD-10-CM | POA: Diagnosis not present

## 2024-05-10 DIAGNOSIS — S43301A Subluxation of unspecified parts of right shoulder girdle, initial encounter: Secondary | ICD-10-CM | POA: Diagnosis not present

## 2024-05-10 DIAGNOSIS — I350 Nonrheumatic aortic (valve) stenosis: Secondary | ICD-10-CM | POA: Diagnosis not present

## 2024-05-10 NOTE — Patient Instructions (Signed)
 Medication Instructions:  No changes   *If you need a refill on your cardiac medications before your next appointment, please call your pharmacy*  Lab Work: BMET today, downstairs on level 1.   If you have labs (blood work) drawn today and your tests are completely normal, you will receive your results only by: MyChart Message (if you have MyChart) OR A paper copy in the mail If you have any lab test that is abnormal or we need to change your treatment, we will call you to review the results.  Follow-Up: At [facility], you and your health needs are our priority.  As part of our continuing mission to provide you with exceptional heart care, our providers are all part of one team.  This team includes your primary Cardiologist (physician) and Advanced Practice Providers or APPs (Physician Assistants and Nurse Practitioners) who all work together to provide you with the care you need, when you need it.  Your next appointment:   6 month(s)  Provider:   APP PA-C  We recommend signing up for the patient portal called MyChart.  Sign up information is provided on this After Visit Summary.  MyChart is used to connect with patients for Virtual Visits (Telemedicine).  Patients are able to view lab/test results, encounter notes, upcoming appointments, etc.  Non-urgent messages can be sent to your provider as well.   To learn more about what you can do with MyChart, go to [email or web address].

## 2024-05-11 ENCOUNTER — Ambulatory Visit: Payer: Self-pay | Admitting: Cardiology

## 2024-05-11 LAB — BASIC METABOLIC PANEL WITH GFR
BUN/Creatinine Ratio: 25 (ref 12–28)
BUN: 13 mg/dL (ref 8–27)
CO2: 23 mmol/L (ref 20–29)
Calcium: 9.5 mg/dL (ref 8.7–10.3)
Chloride: 104 mmol/L (ref 96–106)
Creatinine, Ser: 0.53 mg/dL — ABNORMAL LOW (ref 0.57–1.00)
Glucose: 117 mg/dL — ABNORMAL HIGH (ref 70–99)
Potassium: 4.3 mmol/L (ref 3.5–5.2)
Sodium: 144 mmol/L (ref 134–144)
eGFR: 89 mL/min/1.73 (ref >59)

## 2024-05-13 DIAGNOSIS — R278 Other lack of coordination: Secondary | ICD-10-CM | POA: Diagnosis not present

## 2024-05-13 DIAGNOSIS — S43301A Subluxation of unspecified parts of right shoulder girdle, initial encounter: Secondary | ICD-10-CM | POA: Diagnosis not present

## 2024-05-16 DIAGNOSIS — S43301A Subluxation of unspecified parts of right shoulder girdle, initial encounter: Secondary | ICD-10-CM | POA: Diagnosis not present

## 2024-05-16 DIAGNOSIS — R278 Other lack of coordination: Secondary | ICD-10-CM | POA: Diagnosis not present

## 2024-05-18 DIAGNOSIS — S43301A Subluxation of unspecified parts of right shoulder girdle, initial encounter: Secondary | ICD-10-CM | POA: Diagnosis not present

## 2024-05-18 DIAGNOSIS — R278 Other lack of coordination: Secondary | ICD-10-CM | POA: Diagnosis not present

## 2024-05-20 DIAGNOSIS — R278 Other lack of coordination: Secondary | ICD-10-CM | POA: Diagnosis not present

## 2024-05-20 DIAGNOSIS — S43301A Subluxation of unspecified parts of right shoulder girdle, initial encounter: Secondary | ICD-10-CM | POA: Diagnosis not present

## 2024-05-24 DIAGNOSIS — R278 Other lack of coordination: Secondary | ICD-10-CM | POA: Diagnosis not present

## 2024-05-24 DIAGNOSIS — S43301A Subluxation of unspecified parts of right shoulder girdle, initial encounter: Secondary | ICD-10-CM | POA: Diagnosis not present

## 2024-05-25 ENCOUNTER — Ambulatory Visit (INDEPENDENT_AMBULATORY_CARE_PROVIDER_SITE_OTHER): Admitting: Podiatry

## 2024-05-25 ENCOUNTER — Encounter: Payer: Self-pay | Admitting: Podiatry

## 2024-05-25 DIAGNOSIS — E119 Type 2 diabetes mellitus without complications: Secondary | ICD-10-CM | POA: Diagnosis not present

## 2024-05-25 DIAGNOSIS — M79675 Pain in left toe(s): Secondary | ICD-10-CM | POA: Diagnosis not present

## 2024-05-25 DIAGNOSIS — B351 Tinea unguium: Secondary | ICD-10-CM | POA: Diagnosis not present

## 2024-05-25 DIAGNOSIS — M79674 Pain in right toe(s): Secondary | ICD-10-CM

## 2024-05-26 ENCOUNTER — Encounter: Payer: Self-pay | Admitting: Podiatry

## 2024-05-26 ENCOUNTER — Telehealth (INDEPENDENT_AMBULATORY_CARE_PROVIDER_SITE_OTHER): Admitting: Podiatry

## 2024-05-26 NOTE — Telephone Encounter (Signed)
 Phoned Patricia Petersen regarding patient's experience on yesterday. Patient sustained laceration of great toes by assistant on yesterday. Explained procedure when assistant is utilized during the visit. I took responsibility for breakdown in communication and assured Patricia Petersen her Mom would only see me on her future visits. Offered no charge follow up visit for 2 weeks to check her great toes. They will let me know if they would like to utilize this via MyChart or phone call.

## 2024-05-26 NOTE — Progress Notes (Signed)
 Subjective:  Patient ID: Patricia Petersen, female    DOB: 08-Aug-1934,  MRN: [id number]  Patricia Petersen presents to clinic today for preventative diabetic foot care and painful thick toenails that are difficult to trim. Pain interferes with ambulation. Aggravating factors include wearing enclosed shoe gear. Pain is relieved with periodic professional debridement. She is a resident of [facility]. Her daughter is present during today's visit. They voice no new pedal concerns. She has new Orthofeet clogs which accommodate her pedal swelling and are very comfortable for her.  She had a UTI in April which was cultured and treated by oral antibiotics.  Chief Complaint  Patient presents with   Diabetes    DFC NIDDM A1C 7.3. Toenail trim.    New problem(s): None.   PCP is Charlanne Fredia CROME, MD. Patricia Petersen 02/15/2024.  Allergies  Allergen Reactions   Penicillin G Anaphylaxis    Throat closing up   Arthro-Complex [Nutritional Supplements] Other (See Comments)    Allergy not listed on MAR    Empagliflozin Other (See Comments)    Yeast infection   Levaquin [Levofloxacin In D5w] Other (See Comments)    Reaction not listed on MAR    Levofloxacin Other (See Comments)    Reaction not listed on MAR    Lipitor [Atorvastatin ] Other (See Comments)    Allergy not listed on MAR    Metformin Hcl Other (See Comments)    Stomach cramps   Terbinafine Other (See Comments)    Reaction not listed on MAR    Review of Systems: Negative except as noted in the HPI.  Objective: No changes noted in today's physical examination. There were no vitals filed for this visit. Patricia Petersen is a pleasant 89 y.o. female in NAD. AAO x 3.  Vascular Examination: Capillary refill time immediate b/l. Vascular status intact b/l with palpable pedal pulses. Pedal hair diminished b/l. No pain with calf compression b/l. Skin temperature gradient WNL b/l. No cyanosis or clubbing b/l. No ischemia or gangrene noted b/l.  Significant improvement in pedal edema.  Neurological Examination: Sensation grossly intact b/l with 10 gram monofilament.  Dermatological Examination: Pedal skin with normal turgor, texture and tone b/l.  No open wounds. No interdigital macerations.   Toenails 1-5 b/l thick, discolored, elongated with subungual debris and pain on dorsal palpation.   No hyperkeratotic nor porokeratotic lesions.  Musculoskeletal Examination: Muscle strength 5/5 to all lower extremity muscle groups bilaterally. HAV with bunion deformity noted b/l LE. Utilizes wheelchair for mobility assistance.  Radiographs: None  Last A1c:      12/11/2023   12:00 AM  Hemoglobin A1C  Hemoglobin-A1c 7.3         This result is from an external source.   Assessment/Plan: 1. Pain due to onychomycosis of toenails of both feet   2. Controlled type 2 diabetes mellitus without complication, without long-term current use of insulin  (HCC)     -Patient's family member present. All questions/concerns addressed on today's visit. -Facility to continue fall precautions and pressure precautions. -Toenails 1-5 bilaterally were debrided in length and girth with sterile nail nippers and dremel by assistant Andrez Manchester. Patient evaluated by me after treatment. Laceration of bilateral great toes addressed with Lumicain Hemostatic Solution, cleansed with alcohol. Triple antibiotic ointment applied. Orders written for [facility] staff to apply triple antibiotic ointment once daily for 14 days. Patient and daughter request that all future treatments be performed by me.    Return in about 3 months (around  08/25/2024).  Delon LITTIE Merlin, DPM      [facility] LOCATION: [address] [place] [address]                   Office [phone]   [facility] LOCATION: [address] [place], [place] [address] Office [phone]

## 2024-06-06 ENCOUNTER — Encounter: Payer: Self-pay | Admitting: Internal Medicine

## 2024-06-06 ENCOUNTER — Non-Acute Institutional Stay (SKILLED_NURSING_FACILITY): Payer: Self-pay | Admitting: Internal Medicine

## 2024-06-06 DIAGNOSIS — R251 Tremor, unspecified: Secondary | ICD-10-CM

## 2024-06-06 DIAGNOSIS — E113293 Type 2 diabetes mellitus with mild nonproliferative diabetic retinopathy without macular edema, bilateral: Secondary | ICD-10-CM | POA: Diagnosis not present

## 2024-06-06 DIAGNOSIS — I350 Nonrheumatic aortic (valve) stenosis: Secondary | ICD-10-CM

## 2024-06-06 DIAGNOSIS — M170 Bilateral primary osteoarthritis of knee: Secondary | ICD-10-CM | POA: Diagnosis not present

## 2024-06-06 DIAGNOSIS — I5032 Chronic diastolic (congestive) heart failure: Secondary | ICD-10-CM | POA: Diagnosis not present

## 2024-06-06 DIAGNOSIS — I10 Essential (primary) hypertension: Secondary | ICD-10-CM

## 2024-06-06 DIAGNOSIS — E7800 Pure hypercholesterolemia, unspecified: Secondary | ICD-10-CM | POA: Diagnosis not present

## 2024-06-06 DIAGNOSIS — F3342 Major depressive disorder, recurrent, in full remission: Secondary | ICD-10-CM

## 2024-06-06 HISTORY — DX: Major depressive disorder, recurrent, in full remission: F33.42

## 2024-06-06 HISTORY — DX: Bilateral primary osteoarthritis of knee: M17.0

## 2024-06-06 HISTORY — DX: Tremor, unspecified: R25.1

## 2024-06-06 NOTE — Progress Notes (Signed)
 [facility] Room Number: 118-P Place of Service:  SNF [phone]) Provider:  Fredia Bring, MD  Fredia Bring, MD  Patient Care Team: Bring Fredia CROME, MD as PCP - General (Internal Medicine)  Extended Emergency Contact Information Primary Emergency Contact: trotter,[name] Address: [address]          [place], [place] [address] [place] Home Phone: [phone] Work Phone: [phone] Mobile Phone: [phone] Relation: Daughter  Code Status:  DNR Goals of care: Advanced Directive information    06/06/2024   11:16 AM  Advanced Directives  Does Patient Have a Medical Advance Directive? Yes  Type of Advance Directive Out of facility DNR (pink MOST or yellow form)  Does patient want to make changes to medical advance directive? No - Patient declined     Chief Complaint  Patient presents with   Medical Management of Chronic Issues    Routine visit. Discuss the need for Covid Booster, Influenza vaccine, and Eye exam.    HPI:  Pt is a 89 y.o. female seen today for medical management of chronic diseases.    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    She is stable. No new Nursing issues.  Her weight is stable Uses her Power chair to move around Does not walk due to severe Knee arthritis with risk of falls Cognitively doing well Denies SOB or PND Staff uses Sit and stand up lift No Falls Wt Readings from Last 3 Encounters:  06/06/24 196 lb 9.6 oz (89.2 kg)  05/10/24 196 lb (88.9 kg)  03/11/24 203 lb 6.4 oz (92.3 kg)   Past Medical History:  Diagnosis Date   Diabetes (HCC)    HLD (hyperlipidemia)    HTN (hypertension)    Hypercholesteremia    Low back pain    Obesities, morbid (HCC)    Past Surgical History:  Procedure Laterality Date   APPENDECTOMY      TONSILLECTOMY      Allergies  Allergen Reactions   Penicillin G Anaphylaxis    Throat closing up   Arthro-Complex [Nutritional Supplements] Other (See Comments)    Allergy not listed on MAR    Empagliflozin Other (See Comments)    Yeast infection   Levaquin [Levofloxacin In D5w] Other (See Comments)    Reaction not listed on MAR    Levofloxacin Other (See Comments)    Reaction not listed on MAR    Lipitor [Atorvastatin ] Other (See Comments)    Allergy not listed on MAR    Metformin Hcl Other (See Comments)    Stomach cramps   Terbinafine Other (See Comments)    Reaction not listed on MAR    Allergies as of 06/06/2024       Reactions   Penicillin G Anaphylaxis   Throat closing up   Arthro-complex [nutritional Supplements] Other (See Comments)   Allergy not listed on MAR    Empagliflozin Other (See Comments)   Yeast infection   Levaquin [levofloxacin In D5w] Other (See Comments)   Reaction not listed on MAR    Levofloxacin Other (See Comments)   Reaction not listed on MAR    Lipitor [atorvastatin ] Other (See Comments)   Allergy not listed on MAR    Metformin Hcl Other (See Comments)   Stomach cramps   Terbinafine  Other (See Comments)   Reaction not listed on [facility]        Medication List        Accurate as of June 06, 2024 11:16 AM. If you have any questions, ask your nurse or doctor.          acetaminophen  500 MG tablet Commonly known as: TYLENOL  Take 1,000 mg by mouth 3 (three) times daily as needed (reason not listed on MAR).   atorvastatin  20 MG tablet Commonly known as: LIPITOR Take 20 mg by mouth daily.   cholecalciferol  1000 units tablet Commonly known as: VITAMIN D  Take 1,000 Units by mouth daily.   diltiazem  180 MG 24 hr capsule Commonly known as: Cardizem  CD Take 1 capsule (180 mg total) by mouth daily.   escitalopram  10 MG tablet Commonly known as: LEXAPRO  Take 10 mg by mouth daily.   furosemide  40 MG tablet Commonly known as:  LASIX  Take 40 mg by mouth every morning. 40 mg, oral, once a morning, Lasix , 40 mg, PO q day per new order on 02/01/24   glipiZIDE 2.5 MG 24 hr tablet Commonly known as: GLUCOTROL XL Take 2.5 mg by mouth every morning.   hydrOXYzine  10 MG tablet Commonly known as: ATARAX  Take 1 tablet (10 mg total) by mouth daily as needed.   meloxicam 7.5 MG tablet Commonly known as: MOBIC Take 7.5 mg by mouth daily as needed for pain.   MULTIVITAMIN ADULT PO Take 1 tablet by mouth daily.   Nyamyc powder Generic drug: nystatin Apply 1 Application topically 2 (two) times daily.   Ozempic (0.25 or 0.5 MG/DOSE) 2 MG/3ML Sopn Generic drug: Semaglutide(0.25 or 0.5MG /DOS) Inject 0.5 mg into the skin once a week.   potassium chloride  SA 20 MEQ tablet Commonly known as: KLOR-CON  M Take 20 mEq by mouth daily. 20 meq, oral, once a morning, Potassium, 20meq, PO daily per 02/01/24   SENNA PLUS PO Take 1 tablet by mouth daily as needed.   Sodium Fluoride 1.1 % Pste Place 1 Application onto teeth every evening. Apply pea size amount on tooth brush for oral care.        Review of Systems  Constitutional:  Negative for activity change and appetite change.  HENT: Negative.    Respiratory:  Negative for cough and shortness of breath.   Cardiovascular:  Negative for leg swelling.  Gastrointestinal:  Negative for constipation.  Genitourinary: Negative.   Musculoskeletal:  Positive for gait problem. Negative for arthralgias and myalgias.  Skin: Negative.   Neurological:  Negative for dizziness and weakness.  Psychiatric/Behavioral:  Negative for confusion, dysphoric mood and sleep disturbance.     Immunization History  Administered Date(s) Administered   Fluad  Quad(high Dose 65+) 07/28/2022   Fluad  Trivalent(High Dose 65+) 12/19/2023   Influenza Split 07/26/2009, 07/31/2010, 08/13/2011, 09/02/2012, 09/20/2013, 08/04/2019, 08/24/2020   Influenza,inj,Quad PF,6+ Mos 07/29/2017   Moderna Sars-Covid-2  Vaccination 11/22/2019, 12/06/2019, 08/31/2020, 08/09/2021   PNEUMOCOCCAL CONJUGATE-20 11/24/2022   Pfizer(Comirnaty )Fall Seasonal Vaccine 12 years and older 08/12/2022   Pneumococcal Conjugate-13 10/02/2014   Pneumococcal Polysaccharide-23 03/21/2009, 04/22/2017   Td 03/21/2009   Tdap 08/18/2019   Zoster Recombinant(Shingrix) 09/13/2019, 10/13/2023   Zoster, Live 03/15/2013, 09/13/2019, 03/06/2020   Pertinent  Health Maintenance Due  Topic Date Due   OPHTHALMOLOGY EXAM  Never done   INFLUENZA VACCINE  05/27/2024   HEMOGLOBIN A1C  06/09/2024   FOOT EXAM  08/25/2024   DEXA SCAN  Discontinued      10/19/2023   11:34 AM  Fall Risk  Falls in the past year? 0  Was there an injury with Fall? 0  Fall Risk Category Calculator 0  Patient at Risk for Falls Due to No Fall Risks  Fall risk Follow up Falls evaluation completed   Functional Status Survey:    Vitals:   06/06/24 1058  BP: 134/70  Pulse: 71  Resp: 16  Temp: 97.8 F (36.6 C)  SpO2: 93%  Weight: 196 lb 9.6 oz (89.2 kg)  Height: 5' 3 (1.6 m)   Body mass index is 34.83 kg/m. Physical Exam Vitals reviewed.  Constitutional:      Appearance: Normal appearance.  HENT:     Head: Normocephalic.     Nose: Nose normal.     Mouth/Throat:     Mouth: Mucous membranes are moist.     Pharynx: Oropharynx is clear.  Eyes:     Pupils: Pupils are equal, round, and reactive to light.  Cardiovascular:     Rate and Rhythm: Normal rate and regular rhythm.     Pulses: Normal pulses.     Heart sounds: Murmur heard.  Pulmonary:     Effort: Pulmonary effort is normal.     Breath sounds: Normal breath sounds.  Abdominal:     General: Abdomen is flat. Bowel sounds are normal.     Palpations: Abdomen is soft.  Musculoskeletal:        General: Swelling present.     Cervical back: Neck supple.  Skin:    General: Skin is warm.  Neurological:     General: No focal deficit present.     Mental Status: She is alert and oriented to  person, place, and time.  Psychiatric:        Mood and Affect: Mood normal.        Thought Content: Thought content normal.     Labs reviewed: Recent Labs    01/23/24 1247 01/24/24 0333 01/24/24 1431 02/01/24 0000 05/10/24 1509  NA 140 140  --  142 144  K 4.0 3.1* 3.4* 3.9 4.3  CL 107 105  --  107 104  CO2 27 28  --  26* 23  GLUCOSE 129* 115*  --   --  117*  BUN 13 12  --  11 13  CREATININE 0.43* 0.52  --  0.4* 0.53*  CALCIUM  8.8* 8.3*  --  8.7 9.5   Recent Labs    01/23/24 1247  AST 30  ALT 15  ALKPHOS 105  BILITOT 1.7*  PROT 5.8*  ALBUMIN 2.7*   Recent Labs    01/23/24 1247 01/24/24 0333 01/29/24 0000 02/01/24 0000  WBC 9.7 9.9 8.4 8.0  NEUTROABS 5.6  --   --   --   HGB 11.7* 11.0* 11.6* 11.3*  HCT 35.5* 33.2* 35* 34*  MCV 92.7 92.7  --   --   PLT 291 291 312 316   No results found for: TSH Lab Results  Component Value Date   HGBA1C 7.3 12/11/2023   Lab Results  Component Value Date   CHOL 157 12/11/2023   HDL 72 (A) 12/11/2023   LDLCALC 72 12/11/2023   TRIG 66 12/11/2023    Significant Diagnostic Results in last 30 days:  No results found.  Assessment/Plan 1. 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 Hypercholesterolemia On statin LDL 72 3. Essential hypertension Cardizem   4. Chronic diastolic (  congestive) heart failure (HCC) Lasix   5. Nonrheumatic aortic valve stenosis Decided for Conservative management  6. Tremor Benign  7. Primary osteoarthritis of both knees Using Power chair Tylenol  for Pain Meloxicam PRn  8. Recurrent major depressive disorder, in full remission (HCC) Lexapro     Family/ staff Communication:   Labs/tests ordered:  Vit D  CBC,BMP and A1C

## 2024-06-07 DIAGNOSIS — M7062 Trochanteric bursitis, left hip: Secondary | ICD-10-CM | POA: Diagnosis not present

## 2024-06-07 DIAGNOSIS — E113293 Type 2 diabetes mellitus with mild nonproliferative diabetic retinopathy without macular edema, bilateral: Secondary | ICD-10-CM | POA: Diagnosis not present

## 2024-06-07 DIAGNOSIS — E1142 Type 2 diabetes mellitus with diabetic polyneuropathy: Secondary | ICD-10-CM | POA: Diagnosis not present

## 2024-06-07 DIAGNOSIS — M8589 Other specified disorders of bone density and structure, multiple sites: Secondary | ICD-10-CM | POA: Diagnosis not present

## 2024-06-07 LAB — COMPREHENSIVE METABOLIC PANEL WITH GFR
Calcium: 9.2 (ref 8.7–10.7)
eGFR: 90

## 2024-06-07 LAB — BASIC METABOLIC PANEL WITH GFR
BUN: 13 (ref 4–21)
CO2: 24 — AB (ref 13–22)
Chloride: 105 (ref 99–108)
Creatinine: 0.5 (ref 0.5–1.1)
Glucose: 98
Potassium: 3.8 meq/L (ref 3.5–5.1)
Sodium: 140 (ref 137–147)

## 2024-06-07 LAB — CBC: RBC: 4.24 (ref 3.87–5.11)

## 2024-06-07 LAB — CBC AND DIFFERENTIAL
HCT: 38 (ref 36–46)
Hemoglobin: 12.7 (ref 12.0–16.0)
Platelets: 273 K/uL (ref 150–400)
WBC: 7.8

## 2024-06-07 LAB — HEMOGLOBIN A1C: Hemoglobin A1C: 5.8

## 2024-06-07 LAB — VITAMIN D 25 HYDROXY (VIT D DEFICIENCY, FRACTURES): Vit D, 25-Hydroxy: 31.4

## 2024-06-10 DIAGNOSIS — S43301A Subluxation of unspecified parts of right shoulder girdle, initial encounter: Secondary | ICD-10-CM | POA: Diagnosis not present

## 2024-06-10 DIAGNOSIS — R278 Other lack of coordination: Secondary | ICD-10-CM | POA: Diagnosis not present

## 2024-06-13 DIAGNOSIS — R278 Other lack of coordination: Secondary | ICD-10-CM | POA: Diagnosis not present

## 2024-06-13 DIAGNOSIS — S43301A Subluxation of unspecified parts of right shoulder girdle, initial encounter: Secondary | ICD-10-CM | POA: Diagnosis not present

## 2024-06-15 DIAGNOSIS — R278 Other lack of coordination: Secondary | ICD-10-CM | POA: Diagnosis not present

## 2024-06-15 DIAGNOSIS — S43301A Subluxation of unspecified parts of right shoulder girdle, initial encounter: Secondary | ICD-10-CM | POA: Diagnosis not present

## 2024-07-08 ENCOUNTER — Non-Acute Institutional Stay (SKILLED_NURSING_FACILITY): Payer: Self-pay | Admitting: Adult Health

## 2024-07-08 DIAGNOSIS — M170 Bilateral primary osteoarthritis of knee: Secondary | ICD-10-CM | POA: Diagnosis not present

## 2024-07-08 DIAGNOSIS — I5032 Chronic diastolic (congestive) heart failure: Secondary | ICD-10-CM | POA: Diagnosis not present

## 2024-07-08 DIAGNOSIS — E113293 Type 2 diabetes mellitus with mild nonproliferative diabetic retinopathy without macular edema, bilateral: Secondary | ICD-10-CM | POA: Diagnosis not present

## 2024-07-08 DIAGNOSIS — F3342 Major depressive disorder, recurrent, in full remission: Secondary | ICD-10-CM

## 2024-07-08 DIAGNOSIS — R413 Other amnesia: Secondary | ICD-10-CM | POA: Diagnosis not present

## 2024-07-08 DIAGNOSIS — E7800 Pure hypercholesterolemia, unspecified: Secondary | ICD-10-CM | POA: Diagnosis not present

## 2024-07-08 DIAGNOSIS — I350 Nonrheumatic aortic (valve) stenosis: Secondary | ICD-10-CM | POA: Diagnosis not present

## 2024-07-08 DIAGNOSIS — I10 Essential (primary) hypertension: Secondary | ICD-10-CM | POA: Diagnosis not present

## 2024-07-10 ENCOUNTER — Encounter: Payer: Self-pay | Admitting: Adult Health

## 2024-07-10 NOTE — Assessment & Plan Note (Signed)
 Compensated On lasix 

## 2024-07-10 NOTE — Progress Notes (Signed)
 Location:  Medical illustrator of Service:  SNF (31) Provider:   Bari America, ANP [facility] [phone]   Charlanne Fredia CROME, MD  Patient Care Team: Charlanne Fredia CROME, MD as PCP - General (Internal Medicine)  Extended Emergency Contact Information Primary Emergency Contact: trotter,[name] Address: [address]          [place], [place] [address] [place] Home Phone: [phone] Work Phone: [phone] Mobile Phone: [phone] Relation: Daughter  Code Status:  DNR Goals of care: Advanced Directive information    06/06/2024   11:16 AM  Advanced Directives  Does Patient Have a Medical Advance Directive? Yes  Type of Advance Directive Out of facility DNR (pink MOST or yellow form)  Does patient want to make changes to medical advance directive? No - Patient declined     Chief Complaint  Patient presents with   Medical Management of Chronic Issues    HPI:  Pt is a 89 y.o. female seen today for medical management of chronic diseases.    Resides in skilled care due to mobility issues associated with OA of the knee and cognitive impairment Very pleasant. Able to make needs known and follow commands. Lift for transfers.  MMSE 24/30 01/28/24  Type 2 DM: on ozempic and glipizide, CBGs ranges 119-132. A1c 06/07/24 5.8% Eye exam due 11/25  HLD: on statin, LDL 72 TC 157 12/11/23  HTN: 119-147/70-80 BUN 13.3 Cr 0.47 06/07/24  Vit D def: on supplementation, level 31 06/07/24  Using prn tylenol  for knee pain  Severe AS with CHF follows with cardiology  Echo March 2025 showed Ef 60-65% with severe AS On lasix  with chronic leg edema, no sob. Weight stable.  Wt Readings from Last 3 Encounters:  07/10/24 195 lb 9.6 oz (88.7 kg)  06/06/24 196 lb 9.6 oz (89.2 kg)  05/10/24 196 lb (88.9 kg)    Depression on lexapro :denies feelings of depression or anxiety  Past Medical History:  Diagnosis Date   Diabetes (HCC)    HLD  (hyperlipidemia)    HTN (hypertension)    Hypercholesteremia    Low back pain    Obesities, morbid (HCC)    Past Surgical History:  Procedure Laterality Date   APPENDECTOMY     TONSILLECTOMY      Allergies  Allergen Reactions   Penicillin G Anaphylaxis    Throat closing up   Arthro-Complex [Nutritional Supplements] Other (See Comments)    Allergy not listed on MAR    Empagliflozin Other (See Comments)    Yeast infection   Levaquin [Levofloxacin In D5w] Other (See Comments)    Reaction not listed on MAR    Levofloxacin Other (See Comments)    Reaction not listed on MAR    Lipitor [Atorvastatin ] Other (See Comments)    Allergy not listed on MAR    Metformin Hcl Other (See Comments)    Stomach cramps   Terbinafine Other (See Comments)    Reaction not listed on [facility]    Outpatient Encounter Medications as of 07/08/2024  Medication Sig   acetaminophen  (TYLENOL ) 500 MG tablet Take 1,000 mg by mouth 3 (three) times daily as needed (reason not listed on MAR).   atorvastatin  (LIPITOR) 20 MG tablet Take 20 mg by mouth daily.   cholecalciferol  (VITAMIN D ) 1000 UNITS tablet Take 1,000 Units by mouth daily.   diltiazem  (CARDIZEM  CD) 180 MG 24 hr capsule Take 1 capsule (180 mg total) by mouth daily.   escitalopram  (LEXAPRO )  10 MG tablet Take 10 mg by mouth daily.   furosemide  (LASIX ) 40 MG tablet Take 40 mg by mouth every morning. 40 mg, oral, once a morning, Lasix , 40 mg, PO q day per new order on 02/01/24   glipiZIDE (GLUCOTROL XL) 2.5 MG 24 hr tablet Take 2.5 mg by mouth every morning.   meloxicam (MOBIC) 7.5 MG tablet Take 7.5 mg by mouth daily as needed for pain.   Multiple Vitamin (MULTIVITAMIN ADULT PO) Take 1 tablet by mouth daily.   OZEMPIC, 0.25 OR 0.5 MG/DOSE, 2 MG/3ML SOPN Inject 0.5 mg into the skin once a week.   potassium chloride  SA (KLOR-CON  M) 20 MEQ tablet Take 20 mEq by mouth daily. 20 meq, oral, once a morning, Potassium, 20meq, PO daily per 02/01/24   Sennosides-Docusate  Sodium (SENNA PLUS PO) Take 1 tablet by mouth daily as needed.   Sodium Fluoride 1.1 % PSTE Place 1 Application onto teeth every evening. Apply pea size amount on tooth brush for oral care.   [DISCONTINUED] hydrOXYzine  (ATARAX ) 10 MG tablet Take 1 tablet (10 mg total) by mouth daily as needed. (Patient not taking: Reported on 06/06/2024)   [DISCONTINUED] NYAMYC powder Apply 1 Application topically 2 (two) times daily. (Patient not taking: Reported on 06/06/2024)   No facility-administered encounter medications on file as of 07/08/2024.    Review of Systems  Constitutional:  Negative for activity change, appetite change, chills, diaphoresis, fatigue and fever.  HENT:  Negative for congestion.   Respiratory:  Negative for cough, shortness of breath and wheezing.   Cardiovascular:  Positive for leg swelling. Negative for chest pain.  Gastrointestinal:  Negative for abdominal distention, abdominal pain, constipation, diarrhea, nausea and vomiting.  Genitourinary:  Positive for frequency (due to diuretic and incontinence). Negative for difficulty urinating, dysuria and urgency.  Musculoskeletal:  Positive for arthralgias and gait problem. Negative for back pain, myalgias and neck pain.  Skin:  Negative for rash.  Neurological:  Negative for dizziness and weakness.  Psychiatric/Behavioral:  Negative for confusion.     Immunization History  Administered Date(s) Administered   Fluad  Quad(high Dose 65+) 07/28/2022   Fluad  Trivalent(High Dose 65+) 12/19/2023   Influenza Split 07/26/2009, 07/31/2010, 08/13/2011, 09/02/2012, 09/20/2013, 08/04/2019, 08/24/2020   Influenza,inj,Quad PF,6+ Mos 07/29/2017   Moderna Sars-Covid-2 Vaccination 11/22/2019, 12/06/2019, 08/31/2020, 08/09/2021   PNEUMOCOCCAL CONJUGATE-20 11/24/2022   Pfizer(Comirnaty )Fall Seasonal Vaccine 12 years and older 08/12/2022   Pneumococcal Conjugate-13 10/02/2014   Pneumococcal Polysaccharide-23 03/21/2009, 04/22/2017   Td 03/21/2009    Tdap 08/18/2019   Zoster Recombinant(Shingrix) 09/13/2019, 10/13/2023   Zoster, Live 03/15/2013, 09/13/2019, 03/06/2020   Pertinent  Health Maintenance Due  Topic Date Due   OPHTHALMOLOGY EXAM  Never done   Influenza Vaccine  05/27/2024   FOOT EXAM  08/25/2024   HEMOGLOBIN A1C  12/08/2024   DEXA SCAN  Discontinued      10/19/2023   11:34 AM  Fall Risk  Falls in the past year? 0  Was there an injury with Fall? 0  Fall Risk Category Calculator 0  Patient at Risk for Falls Due to No Fall Risks  Fall risk Follow up Falls evaluation completed   Functional Status Survey:    Vitals:   07/10/24 0735  BP: (!) 147/75  Pulse: 74  Resp: 16  Temp: (!) 97.5 F (36.4 C)  Weight: 195 lb 9.6 oz (88.7 kg)   Body mass index is 34.65 kg/m. Physical Exam Vitals and nursing note reviewed.  Constitutional:  General: She is not in acute distress.    Appearance: She is not diaphoretic.  HENT:     Head: Normocephalic and atraumatic.  Neck:     Vascular: No JVD.  Cardiovascular:     Rate and Rhythm: Normal rate and regular rhythm.     Heart sounds: Murmur heard.  Pulmonary:     Effort: Pulmonary effort is normal. No respiratory distress.     Breath sounds: Normal breath sounds. No wheezing.  Abdominal:     General: Bowel sounds are normal. There is no distension.     Palpations: Abdomen is soft.     Tenderness: There is no abdominal tenderness.  Musculoskeletal:     Right lower leg: Edema present.     Left lower leg: Edema present.  Skin:    General: Skin is warm and dry.  Neurological:     Mental Status: She is alert and oriented to person, place, and time. Mental status is at baseline.  Psychiatric:        Mood and Affect: Mood normal.     Labs reviewed: Recent Labs    01/23/24 1247 01/24/24 0333 01/24/24 1431 02/01/24 0000 05/10/24 1509  NA 140 140  --  142 144  K 4.0 3.1* 3.4* 3.9 4.3  CL 107 105  --  107 104  CO2 27 28  --  26* 23  GLUCOSE 129* 115*  --    --  117*  BUN 13 12  --  11 13  CREATININE 0.43* 0.52  --  0.4* 0.53*  CALCIUM  8.8* 8.3*  --  8.7 9.5   Recent Labs    01/23/24 1247  AST 30  ALT 15  ALKPHOS 105  BILITOT 1.7*  PROT 5.8*  ALBUMIN 2.7*   Recent Labs    01/23/24 1247 01/24/24 0333 01/29/24 0000 02/01/24 0000  WBC 9.7 9.9 8.4 8.0  NEUTROABS 5.6  --   --   --   HGB 11.7* 11.0* 11.6* 11.3*  HCT 35.5* 33.2* 35* 34*  MCV 92.7 92.7  --   --   PLT 291 291 312 316   No results found for: TSH Lab Results  Component Value Date   HGBA1C 7.3 12/11/2023   Lab Results  Component Value Date   CHOL 157 12/11/2023   HDL 72 (A) 12/11/2023   LDLCALC 72 12/11/2023   TRIG 66 12/11/2023    Significant Diagnostic Results in last 30 days:  No results found.  Assessment/Plan  Type 2 diabetes mellitus with mild nonproliferative diabetic retinopathy without macular edema, bilateral (HCC) A1c is trending downward Continue current meds.  Heart healthy diabetic diet  Recurrent major depressive disorder, in full remission (HCC) On lexparo, no current issues.   Primary osteoarthritis of both knees Chronic pain but using prn tylenol  with no complaints.   Aortic stenosis Severe Medical management Followed by cardiology   Chronic diastolic (congestive) heart failure (HCC) Compensated On lasix    Memory loss Short term memory loss Very pleasant and adjusted to skilled care.   Hypercholesterolemia Continue statin

## 2024-07-10 NOTE — Assessment & Plan Note (Signed)
 Continue statin

## 2024-07-10 NOTE — Assessment & Plan Note (Signed)
 Chronic pain but using prn tylenol  with no complaints.

## 2024-07-10 NOTE — Assessment & Plan Note (Signed)
 On lexparo, no current issues.

## 2024-07-10 NOTE — Assessment & Plan Note (Signed)
 Short term memory loss Very pleasant and adjusted to skilled care.

## 2024-07-10 NOTE — Assessment & Plan Note (Signed)
 Severe Medical management Followed by cardiology

## 2024-07-10 NOTE — Assessment & Plan Note (Signed)
 A1c is trending downward Continue current meds.  Heart healthy diabetic diet

## 2024-08-02 DIAGNOSIS — M79671 Pain in right foot: Secondary | ICD-10-CM | POA: Diagnosis not present

## 2024-08-02 DIAGNOSIS — M2042 Other hammer toe(s) (acquired), left foot: Secondary | ICD-10-CM | POA: Diagnosis not present

## 2024-08-02 DIAGNOSIS — E1159 Type 2 diabetes mellitus with other circulatory complications: Secondary | ICD-10-CM | POA: Diagnosis not present

## 2024-08-02 DIAGNOSIS — B351 Tinea unguium: Secondary | ICD-10-CM | POA: Diagnosis not present

## 2024-08-02 DIAGNOSIS — M2041 Other hammer toe(s) (acquired), right foot: Secondary | ICD-10-CM | POA: Diagnosis not present

## 2024-08-02 DIAGNOSIS — M79672 Pain in left foot: Secondary | ICD-10-CM | POA: Diagnosis not present

## 2024-08-08 ENCOUNTER — Encounter: Payer: Self-pay | Admitting: Adult Health

## 2024-08-08 ENCOUNTER — Non-Acute Institutional Stay (SKILLED_NURSING_FACILITY): Admitting: Adult Health

## 2024-08-08 DIAGNOSIS — E7800 Pure hypercholesterolemia, unspecified: Secondary | ICD-10-CM | POA: Diagnosis not present

## 2024-08-08 DIAGNOSIS — E559 Vitamin D deficiency, unspecified: Secondary | ICD-10-CM | POA: Insufficient documentation

## 2024-08-08 DIAGNOSIS — E113293 Type 2 diabetes mellitus with mild nonproliferative diabetic retinopathy without macular edema, bilateral: Secondary | ICD-10-CM

## 2024-08-08 DIAGNOSIS — Z6837 Body mass index (BMI) 37.0-37.9, adult: Secondary | ICD-10-CM

## 2024-08-08 DIAGNOSIS — M170 Bilateral primary osteoarthritis of knee: Secondary | ICD-10-CM

## 2024-08-08 DIAGNOSIS — I5032 Chronic diastolic (congestive) heart failure: Secondary | ICD-10-CM | POA: Diagnosis not present

## 2024-08-08 DIAGNOSIS — I350 Nonrheumatic aortic (valve) stenosis: Secondary | ICD-10-CM | POA: Diagnosis not present

## 2024-08-08 NOTE — Assessment & Plan Note (Signed)
 Severe Medical management Followed by cardiology

## 2024-08-08 NOTE — Assessment & Plan Note (Signed)
On ozempic

## 2024-08-08 NOTE — Assessment & Plan Note (Signed)
 On supplementation

## 2024-08-08 NOTE — Progress Notes (Signed)
 Location:  [facility] Room Number: 118 P Place of Service:  SNF [phone]) Provider:  Tawni America, NP   Patient Care Team: Charlanne Fredia CROME, MD as PCP - General (Internal Medicine)  Extended Emergency Contact Information Primary Emergency Contact: trotter,[name] Address: [address]          [place], [place] [address] [place] Home Phone: [phone] Work Phone: [phone] Mobile Phone: [phone] Relation: Daughter  Code Status:  DNR Goals of care: Advanced Directive information    06/06/2024   11:16 AM  Advanced Directives  Does Patient Have a Medical Advance Directive? Yes  Type of Advance Directive Out of facility DNR (pink MOST or yellow form)  Does patient want to make changes to medical advance directive? No - Patient declined     Chief Complaint  Patient presents with   Routine Visit    HPI:  Pt is a 89 y.o. female seen today for medical management of chronic diseases.    Resides in skilled care due to mobility issues associated with OA of the knee and cognitive impairment  No acute complaints today   Very pleasant. Able to make needs known and follow commands. Lift for transfers.  MMSE 24/30 01/28/24  Type 2 DM: on ozempic and glipizide, CBGs ranges 105-137. A1c 06/07/24 5.8% Eye exam due 11/25  HLD: on statin, LDL 72 TC 157 12/11/23  HTN: 119-147/70-80 BUN 13.3 Cr 0.47 06/07/24  Vit D def: on supplementation, level 31 06/07/24  Using prn tylenol  and mobic or knee pain  Severe AS with CHF follows with cardiology  Echo March 2025 showed Ef 60-65% with severe AS On lasix  with chronic leg edema, no sob. Weight stable.  Wt Readings from Last 3 Encounters:  08/08/24 196 lb 12.8 oz (89.3 kg)  07/10/24 195 lb 9.6 oz (88.7 kg)  06/06/24 196 lb 9.6 oz (89.2 kg)    Depression on lexapro :denies feelings of depression or anxiety  Past Medical History:  Diagnosis Date   Aortic valve sclerosis 12/09/2021    Diabetes (HCC)    HLD (hyperlipidemia)    HTN (hypertension)    Hypercholesteremia    Low back pain    Memory loss 03/11/2024   Obesities, morbid (HCC)    Polyneuropathy due to type 2 diabetes mellitus (HCC) 03/20/2021   Primary osteoarthritis of both knees 06/06/2024   Recurrent major depressive disorder, in full remission 06/06/2024   Tremor 06/06/2024   Past Surgical History:  Procedure Laterality Date   APPENDECTOMY     TONSILLECTOMY      Allergies  Allergen Reactions   Penicillin G Anaphylaxis    Throat closing up   Arthro-Complex [Nutritional Supplements] Other (See Comments)    Allergy not listed on MAR    Empagliflozin Other (See Comments)    Yeast infection   Levaquin [Levofloxacin In D5w] Other (See Comments)    Reaction not listed on MAR    Levofloxacin Other (See Comments)    Reaction not listed on MAR    Lipitor [Atorvastatin ] Other (See Comments)    Allergy not listed on MAR    Metformin Hcl Other (See Comments)    Stomach cramps   Terbinafine Other (See Comments)    Reaction not listed on [facility]    Outpatient Encounter Medications as of 08/08/2024  Medication Sig   acetaminophen  (TYLENOL ) 500 MG tablet Take 1,000 mg by mouth 3 (three) times daily as needed (reason not listed on MAR).   atorvastatin  (LIPITOR) 20  MG tablet Take 20 mg by mouth daily.   cholecalciferol  (VITAMIN D ) 1000 UNITS tablet Take 1,000 Units by mouth daily.   diltiazem  (CARDIZEM  CD) 180 MG 24 hr capsule Take 1 capsule (180 mg total) by mouth daily.   escitalopram  (LEXAPRO ) 10 MG tablet Take 10 mg by mouth daily.   furosemide  (LASIX ) 40 MG tablet Take 40 mg by mouth every morning. 40 mg, oral, once a morning, Lasix , 40 mg, PO q day per new order on 02/01/24   glipiZIDE (GLUCOTROL XL) 2.5 MG 24 hr tablet Take 2.5 mg by mouth every morning.   meloxicam (MOBIC) 7.5 MG tablet Take 7.5 mg by mouth daily as needed for pain.   Multiple Vitamin (MULTIVITAMIN ADULT PO) Take 1 tablet by mouth  daily.   OZEMPIC, 0.25 OR 0.5 MG/DOSE, 2 MG/3ML SOPN Inject 0.5 mg into the skin once a week.   potassium chloride  SA (KLOR-CON  M) 20 MEQ tablet Take 20 mEq by mouth daily. 20 meq, oral, once a morning, Potassium, 20meq, PO daily per 02/01/24   Sennosides-Docusate Sodium  (SENNA PLUS PO) Take 1 tablet by mouth daily as needed.   Sodium Fluoride 1.1 % PSTE Place 1 Application onto teeth every evening. Apply pea size amount on tooth brush for oral care.   No facility-administered encounter medications on file as of 08/08/2024.    Review of Systems  Constitutional:  Negative for activity change, appetite change, chills, diaphoresis, fatigue and fever.  HENT:  Negative for congestion.   Respiratory:  Negative for cough, shortness of breath and wheezing.   Cardiovascular:  Positive for leg swelling. Negative for chest pain.  Gastrointestinal:  Negative for abdominal distention, abdominal pain, constipation, diarrhea, nausea and vomiting.  Genitourinary:  Negative for difficulty urinating, dysuria and urgency.  Musculoskeletal:  Positive for arthralgias and gait problem. Negative for back pain, myalgias and neck pain.  Skin:  Negative for rash.  Neurological:  Negative for dizziness and weakness.  Psychiatric/Behavioral:  Negative for confusion.        Memory loss    Immunization History  Administered Date(s) Administered   Fluad  Quad(high Dose 65+) 07/28/2022   Fluad  Trivalent(High Dose 65+) 12/19/2023   Influenza Split 07/26/2009, 07/31/2010, 08/13/2011, 09/02/2012, 09/20/2013, 08/04/2019, 08/24/2020   Influenza,inj,Quad PF,6+ Mos 07/29/2017   Influenza-Unspecified 07/26/2024   Moderna Sars-Covid-2 Vaccination 11/22/2019, 12/06/2019, 08/31/2020, 08/09/2021   PNEUMOCOCCAL CONJUGATE-20 11/24/2022   Pfizer(Comirnaty )Fall Seasonal Vaccine 12 years and older 08/12/2022   Pneumococcal Conjugate-13 10/02/2014   Pneumococcal Polysaccharide-23 03/21/2009, 04/22/2017   Td 03/21/2009   Tdap  08/18/2019   Unspecified SARS-COV-2 Vaccination 08/06/2024   Zoster Recombinant(Shingrix) 09/13/2019, 10/13/2023   Zoster, Live 03/15/2013, 09/13/2019, 03/06/2020   Pertinent  Health Maintenance Due  Topic Date Due   FOOT EXAM  08/25/2024   OPHTHALMOLOGY EXAM  09/17/2024   HEMOGLOBIN A1C  12/08/2024   Influenza Vaccine  Completed   DEXA SCAN  Discontinued      10/19/2023   11:34 AM  Fall Risk  Falls in the past year? 0  Was there an injury with Fall? 0  Fall Risk Category Calculator 0  Patient at Risk for Falls Due to No Fall Risks  Fall risk Follow up Falls evaluation completed   Functional Status Survey:    Vitals:   08/08/24 1058  BP: 119/76  Pulse: 71  Resp: 17  Temp: (!) 96.8 F (36 C)  SpO2: 95%  Weight: 196 lb 12.8 oz (89.3 kg)  Height: 5' 3 (1.6 m)   Body  mass index is 34.86 kg/m. Physical Exam Vitals and nursing note reviewed.  Constitutional:      Appearance: Normal appearance.  HENT:     Head: Normocephalic and atraumatic.  Cardiovascular:     Rate and Rhythm: Normal rate and regular rhythm.     Heart sounds: No murmur heard. Pulmonary:     Effort: Pulmonary effort is normal. No respiratory distress.     Breath sounds: Normal breath sounds. No wheezing.  Abdominal:     General: Bowel sounds are normal. There is no distension.     Palpations: Abdomen is soft.     Tenderness: There is no abdominal tenderness.  Musculoskeletal:     Cervical back: No rigidity.     Right lower leg: Edema (+2) present.     Left lower leg: Edema (+2) present.  Lymphadenopathy:     Cervical: No cervical adenopathy.  Skin:    General: Skin is warm and dry.  Neurological:     General: No focal deficit present.     Mental Status: She is alert and oriented to person, place, and time. Mental status is at baseline.  Psychiatric:        Mood and Affect: Mood normal.     Labs reviewed: Recent Labs    01/23/24 1247 01/24/24 0333 01/24/24 1431 02/01/24 0000  05/10/24 1509 06/07/24 0000  NA 140 140  --  142 144 140  K 4.0 3.1*   < > 3.9 4.3 3.8  CL 107 105  --  107 104 105  CO2 27 28  --  26* 23 24*  GLUCOSE 129* 115*  --   --  117*  --   BUN 13 12  --  11 13 13   CREATININE 0.43* 0.52  --  0.4* 0.53* 0.5  CALCIUM  8.8* 8.3*  --  8.7 9.5 9.2   < > = values in this interval not displayed.   Recent Labs    01/23/24 1247  AST 30  ALT 15  ALKPHOS 105  BILITOT 1.7*  PROT 5.8*  ALBUMIN 2.7*   Recent Labs    01/23/24 1247 01/24/24 0333 01/29/24 0000 02/01/24 0000 06/07/24 0000  WBC 9.7 9.9 8.4 8.0 7.8  NEUTROABS 5.6  --   --   --   --   HGB 11.7* 11.0* 11.6* 11.3* 12.7  HCT 35.5* 33.2* 35* 34* 38  MCV 92.7 92.7  --   --   --   PLT 291 291 312 316 273   No results found for: TSH Lab Results  Component Value Date   HGBA1C 5.8 06/07/2024   Lab Results  Component Value Date   CHOL 157 12/11/2023   HDL 72 (A) 12/11/2023   LDLCALC 72 12/11/2023   TRIG 66 12/11/2023    Significant Diagnostic Results in last 30 days:  No results found.  Assessment/Plan  Type 2 diabetes mellitus with mild nonproliferative diabetic retinopathy without macular edema, bilateral (HCC) A1C trending down Continue current meds.  Heart healthy diabetic diet  Primary osteoarthritis of both knees Chronic pain but using prn mobic and tylenol  with no complaints.   Hypercholesterolemia Continue statin   Chronic diastolic (congestive) heart failure (HCC) Compensated On lasix  Also wears compression hose   Aortic stenosis Severe Medical management Followed by cardiology   Body mass index (BMI) 37.0-37.9, adult On ozempic   Morbid obesity (HCC) On ozempic    Vitamin D  deficiency On supplementation

## 2024-08-08 NOTE — Assessment & Plan Note (Signed)
 Chronic pain but using prn mobic and tylenol  with no complaints.

## 2024-08-08 NOTE — Assessment & Plan Note (Addendum)
 Compensated On lasix  Also wears compression hose

## 2024-08-08 NOTE — Assessment & Plan Note (Signed)
 Continue statin

## 2024-08-08 NOTE — Assessment & Plan Note (Signed)
 A1C trending down Continue current meds.  Heart healthy diabetic diet

## 2024-08-29 ENCOUNTER — Encounter: Payer: Self-pay | Admitting: Internal Medicine

## 2024-08-29 ENCOUNTER — Non-Acute Institutional Stay (SKILLED_NURSING_FACILITY): Payer: Self-pay | Admitting: Internal Medicine

## 2024-08-29 DIAGNOSIS — Z7984 Long term (current) use of oral hypoglycemic drugs: Secondary | ICD-10-CM

## 2024-08-29 DIAGNOSIS — M170 Bilateral primary osteoarthritis of knee: Secondary | ICD-10-CM

## 2024-08-29 DIAGNOSIS — I350 Nonrheumatic aortic (valve) stenosis: Secondary | ICD-10-CM

## 2024-08-29 DIAGNOSIS — E113293 Type 2 diabetes mellitus with mild nonproliferative diabetic retinopathy without macular edema, bilateral: Secondary | ICD-10-CM

## 2024-08-29 DIAGNOSIS — I5032 Chronic diastolic (congestive) heart failure: Secondary | ICD-10-CM

## 2024-08-29 DIAGNOSIS — I10 Essential (primary) hypertension: Secondary | ICD-10-CM

## 2024-08-29 DIAGNOSIS — E7800 Pure hypercholesterolemia, unspecified: Secondary | ICD-10-CM | POA: Diagnosis not present

## 2024-08-29 DIAGNOSIS — F3342 Major depressive disorder, recurrent, in full remission: Secondary | ICD-10-CM

## 2024-08-29 LAB — OPHTHALMOLOGY REPORT-SCANNED

## 2024-08-29 NOTE — Progress Notes (Unsigned)
 Location:   [facility]   Place of Service:   SNF  Provider:   Code Status:  Goals of Care:     06/06/2024   11:16 AM  Advanced Directives  Does Patient Have a Medical Advance Directive? Yes  Type of Advance Directive Out of facility DNR (pink MOST or yellow form)  Does patient want to make changes to medical advance directive? No - Patient declined     Chief Complaint  Patient presents with   Care Management    HPI: Patient is a 89 y.o. female seen today for medical management of chronic diseases.    Patient is in SNF in [place]   Patient has a history of hypertension, HLD, type 2 diabetes with neuropathy H/o Severe AS with CHF   Patient also has a history of bilateral knee arthritis  She is stable No Nursing issues Uses Stand up lift for Transfers Do not walk due to arthritis Cognitively doing well No Falls Power chair for long distance Wt Readings from Last 3 Encounters:  08/29/24 193 lb (87.5 kg)  08/08/24 196 lb 12.8 oz (89.3 kg)  07/10/24 195 lb 9.6 oz (88.7 kg)    Past Medical History:  Diagnosis Date   Aortic valve sclerosis 12/09/2021   Diabetes (HCC)    HLD (hyperlipidemia)    HTN (hypertension)    Hypercholesteremia    Low back pain    Memory loss 03/11/2024   Obesities, morbid (HCC)    Polyneuropathy due to type 2 diabetes mellitus (HCC) 03/20/2021   Primary osteoarthritis of both knees 06/06/2024   Recurrent major depressive disorder, in full remission 06/06/2024   Tremor 06/06/2024    Past Surgical History:  Procedure Laterality Date   APPENDECTOMY     TONSILLECTOMY      Allergies  Allergen Reactions   Penicillin G Anaphylaxis    Throat closing up   Arthro-Complex [Nutritional Supplements] Other (See Comments)    Allergy not listed on MAR    Empagliflozin Other (See Comments)    Yeast infection   Levaquin [Levofloxacin In D5w] Other (See Comments)    Reaction not listed on MAR    Levofloxacin Other (See Comments)    Reaction not  listed on MAR    Lipitor [Atorvastatin ] Other (See Comments)    Allergy not listed on MAR    Metformin Hcl Other (See Comments)    Stomach cramps   Terbinafine Other (See Comments)    Reaction not listed on [facility]    Outpatient Encounter Medications as of 08/29/2024  Medication Sig   acetaminophen  (TYLENOL ) 500 MG tablet Take 1,000 mg by mouth 3 (three) times daily as needed (reason not listed on MAR).   atorvastatin  (LIPITOR) 20 MG tablet Take 20 mg by mouth daily.   cholecalciferol  (VITAMIN D ) 1000 UNITS tablet Take 1,000 Units by mouth daily.   diltiazem  (CARDIZEM  CD) 180 MG 24 hr capsule Take 1 capsule (180 mg total) by mouth daily.   escitalopram  (LEXAPRO ) 10 MG tablet Take 10 mg by mouth daily.   furosemide  (LASIX ) 40 MG tablet Take 40 mg by mouth every morning. 40 mg, oral, once a morning, Lasix , 40 mg, PO q day per new order on 02/01/24   glipiZIDE (GLUCOTROL XL) 2.5 MG 24 hr tablet Take 2.5 mg by mouth every morning.   meloxicam (MOBIC) 7.5 MG tablet Take 7.5 mg by mouth daily as needed for pain.   Multiple Vitamin (MULTIVITAMIN ADULT PO) Take 1 tablet by mouth daily.  OZEMPIC, 0.25 OR 0.5 MG/DOSE, 2 MG/3ML SOPN Inject 0.5 mg into the skin once a week.   potassium chloride  SA (KLOR-CON  M) 20 MEQ tablet Take 20 mEq by mouth daily. 20 meq, oral, once a morning, Potassium, 20meq, PO daily per 02/01/24   Sennosides-Docusate Sodium  (SENNA PLUS PO) Take 1 tablet by mouth daily as needed.   Sodium Fluoride 1.1 % PSTE Place 1 Application onto teeth every evening. Apply pea size amount on tooth brush for oral care.   No facility-administered encounter medications on file as of 08/29/2024.    Review of Systems:  Review of Systems  Constitutional:  Negative for activity change and appetite change.  HENT: Negative.    Respiratory:  Negative for cough and shortness of breath.   Cardiovascular:  Negative for leg swelling.  Gastrointestinal:  Negative for constipation.  Genitourinary: Negative.    Musculoskeletal:  Positive for arthralgias and gait problem. Negative for myalgias.  Skin: Negative.   Neurological:  Negative for dizziness and weakness.  Psychiatric/Behavioral:  Negative for confusion, dysphoric mood and sleep disturbance.     Health Maintenance  Topic Date Due   FOOT EXAM  08/25/2024   OPHTHALMOLOGY EXAM  09/17/2024   Medicare Annual Wellness (AWV)  11/26/2024   HEMOGLOBIN A1C  12/08/2024   COVID-19 Vaccine (7 - Moderna risk 2025-26 season) 02/04/2025   DTaP/Tdap/Td (3 - Td or Tdap) 08/17/2029   Pneumococcal Vaccine: 50+ Years  Completed   Influenza Vaccine  Completed   Zoster Vaccines- Shingrix  Completed   Meningococcal B Vaccine  Aged Out   DEXA SCAN  Discontinued    Physical Exam: Vitals:   08/29/24 1914  BP: (!) 150/73  Pulse: 72  Resp: 17  Temp: (!) 97 F (36.1 C)  Weight: 193 lb (87.5 kg)   Body mass index is 34.19 kg/m. Physical Exam Vitals reviewed.  Constitutional:      Appearance: Normal appearance.  HENT:     Head: Normocephalic.     Nose: Nose normal.     Mouth/Throat:     Mouth: Mucous membranes are moist.     Pharynx: Oropharynx is clear.  Eyes:     Pupils: Pupils are equal, round, and reactive to light.  Cardiovascular:     Rate and Rhythm: Normal rate and regular rhythm.     Pulses: Normal pulses.     Heart sounds: Murmur heard.  Pulmonary:     Effort: Pulmonary effort is normal.     Breath sounds: Normal breath sounds.  Abdominal:     General: Abdomen is flat. Bowel sounds are normal.     Palpations: Abdomen is soft.  Musculoskeletal:        General: Swelling present.     Cervical back: Neck supple.  Skin:    General: Skin is warm.  Neurological:     General: No focal deficit present.     Mental Status: She is alert and oriented to person, place, and time.  Psychiatric:        Mood and Affect: Mood normal.        Thought Content: Thought content normal.     Labs reviewed: Basic Metabolic Panel: Recent Labs     01/23/24 1247 01/24/24 0333 01/24/24 1431 02/01/24 0000 05/10/24 1509 06/07/24 0000  NA 140 140  --  142 144 140  K 4.0 3.1*   < > 3.9 4.3 3.8  CL 107 105  --  107 104 105  CO2 27 28  --  26* 23 24*  GLUCOSE 129* 115*  --   --  117*  --   BUN 13 12  --  11 13 13   CREATININE 0.43* 0.52  --  0.4* 0.53* 0.5  CALCIUM  8.8* 8.3*  --  8.7 9.5 9.2   < > = values in this interval not displayed.   Liver Function Tests: Recent Labs    01/23/24 1247  AST 30  ALT 15  ALKPHOS 105  BILITOT 1.7*  PROT 5.8*  ALBUMIN 2.7*   No results for input(s): LIPASE, AMYLASE in the last 8760 hours. No results for input(s): AMMONIA in the last 8760 hours. CBC: Recent Labs    01/23/24 1247 01/24/24 0333 01/29/24 0000 02/01/24 0000 06/07/24 0000  WBC 9.7 9.9 8.4 8.0 7.8  NEUTROABS 5.6  --   --   --   --   HGB 11.7* 11.0* 11.6* 11.3* 12.7  HCT 35.5* 33.2* 35* 34* 38  MCV 92.7 92.7  --   --   --   PLT 291 291 312 316 273   Lipid Panel: Recent Labs    12/11/23 0000  CHOL 157  HDL 72*  LDLCALC 72  TRIG 66   Lab Results  Component Value Date   HGBA1C 5.8 06/07/2024    Procedures since last visit: No results found.  Assessment/Plan 1. Type 2 diabetes mellitus with both eyes affected by mild nonproliferative retinopathy without macular edema, without long-term current use of insulin  (HCC) (Primary) Glipizide and Ozempic Las A1C 5.8 Discontinue Glipizide Going for Eye exam today 2. Primary osteoarthritis of both knees Tylenol  Mobic PRn Uses Power chair now 3. Hypercholesterolemia Statin LDL 72 in 2/25  4. Chronic diastolic (congestive) heart failure (HCC) Lasix   5. Nonrheumatic aortic valve stenosis Conservative management  6. Morbid obesity (HCC) On Ozempic  7. Recurrent major depressive disorder, in full remission Lexapro   8. Essential hypertension Cardizem     Labs/tests ordered:  * No order type specified * Next appt:  Visit date not found

## 2024-09-07 ENCOUNTER — Ambulatory Visit: Admitting: Podiatry

## 2024-09-29 ENCOUNTER — Non-Acute Institutional Stay (SKILLED_NURSING_FACILITY): Payer: Self-pay | Admitting: Adult Health

## 2024-09-29 DIAGNOSIS — E559 Vitamin D deficiency, unspecified: Secondary | ICD-10-CM

## 2024-09-29 DIAGNOSIS — B353 Tinea pedis: Secondary | ICD-10-CM

## 2024-09-29 DIAGNOSIS — I350 Nonrheumatic aortic (valve) stenosis: Secondary | ICD-10-CM | POA: Diagnosis not present

## 2024-09-29 DIAGNOSIS — R413 Other amnesia: Secondary | ICD-10-CM | POA: Diagnosis not present

## 2024-09-29 DIAGNOSIS — I10 Essential (primary) hypertension: Secondary | ICD-10-CM

## 2024-09-29 DIAGNOSIS — M170 Bilateral primary osteoarthritis of knee: Secondary | ICD-10-CM

## 2024-09-29 DIAGNOSIS — I5032 Chronic diastolic (congestive) heart failure: Secondary | ICD-10-CM

## 2024-09-29 DIAGNOSIS — E113293 Type 2 diabetes mellitus with mild nonproliferative diabetic retinopathy without macular edema, bilateral: Secondary | ICD-10-CM

## 2024-09-29 DIAGNOSIS — Z7985 Long-term (current) use of injectable non-insulin antidiabetic drugs: Secondary | ICD-10-CM | POA: Diagnosis not present

## 2024-09-30 ENCOUNTER — Encounter: Payer: Self-pay | Admitting: Adult Health

## 2024-09-30 DIAGNOSIS — B353 Tinea pedis: Secondary | ICD-10-CM | POA: Insufficient documentation

## 2024-09-30 MED ORDER — CLOTRIMAZOLE 1 % EX CREA: 1.0000 | TOPICAL_CREAM | Freq: Two times a day (BID) | CUTANEOUS | Status: DC

## 2024-09-30 NOTE — Assessment & Plan Note (Signed)
 Compensated On lasix  Also wears compression hose

## 2024-09-30 NOTE — Progress Notes (Signed)
 Location:  [facility]  SNF (31) Provider:  Tawni America, NP   Patient Care Team: Charlanne Fredia CROME, MD as PCP - General (Internal Medicine)  Extended Emergency Contact Information Primary Emergency Contact: trotter,[name] Address: [address]          [place], [place] [address] [place] Home Phone: [phone] Work Phone: [phone] Mobile Phone: [phone] Relation: Daughter  Code Status:  DNR Goals of care: Advanced Directive information    06/06/2024   11:16 AM  Advanced Directives  Does Patient Have a Medical Advance Directive? Yes  Type of Advance Directive Out of facility DNR (pink MOST or yellow form)  Does patient want to make changes to medical advance directive? No - Patient declined     Chief Complaint  Patient presents with   Medical Management of Chronic Issues    HPI:  Pt is a 89 y.o. female seen today for medical management of chronic diseases.    Resides in skilled care due to mobility issues associated with OA of the knee and cognitive impairment Nurse reports redness to both feet and dry scaly skin Also is using nystatin due to yeast in abd folds  Very pleasant. Able to make needs known and follow commands. Lift for transfers.  MMSE 24/30 01/28/24  Type 2 DM: on ozempic and off glipizide, CBGs ranges 138-193. A1c 06/07/24 5.8%  HLD: on statin, LDL 72 TC 157 12/11/23  HTN: controlled  BUN 13.3 Cr 0.47 06/07/24  Vit D def: on supplementation, level 31 06/07/24  Using prn tylenol  and mobic for knee pain  Severe AS with CHF follows with cardiology  Echo March 2025 showed Ef 60-65% with severe AS On lasix  with chronic leg edema, no sob. Weight stable.  Wt Readings from Last 3 Encounters:  09/30/24 193 lb 6.4 oz (87.7 kg)  08/29/24 193 lb (87.5 kg)  08/08/24 196 lb 12.8 oz (89.3 kg)    Depression on lexapro :denies feelings of depression or anxiety  Past Medical History:  Diagnosis  Date   Aortic valve sclerosis 12/09/2021   Diabetes (HCC)    HLD (hyperlipidemia)    HTN (hypertension)    Hypercholesteremia    Low back pain    Memory loss 03/11/2024   Obesities, morbid (HCC)    Polyneuropathy due to type 2 diabetes mellitus (HCC) 03/20/2021   Primary osteoarthritis of both knees 06/06/2024   Recurrent major depressive disorder, in full remission 06/06/2024   Tremor 06/06/2024   Past Surgical History:  Procedure Laterality Date   APPENDECTOMY     TONSILLECTOMY      Allergies  Allergen Reactions   Penicillin G Anaphylaxis    Throat closing up   Arthro-Complex [Nutritional Supplements] Other (See Comments)    Allergy not listed on MAR    Empagliflozin Other (See Comments)    Yeast infection   Levaquin [Levofloxacin In D5w] Other (See Comments)    Reaction not listed on MAR    Levofloxacin Other (See Comments)    Reaction not listed on MAR    Lipitor [Atorvastatin ] Other (See Comments)    Allergy not listed on MAR    Metformin Hcl Other (See Comments)    Stomach cramps   Terbinafine Other (See Comments)    Reaction not listed on [facility]    Outpatient Encounter Medications as of 09/29/2024  Medication Sig   acetaminophen  (TYLENOL ) 500 MG tablet Take 1,000 mg by mouth 3 (three) times daily as needed (reason  not listed on MAR).   atorvastatin  (LIPITOR) 20 MG tablet Take 20 mg by mouth daily.   cholecalciferol  (VITAMIN D ) 1000 UNITS tablet Take 1,000 Units by mouth daily.   clotrimazole  (LOTRIMIN ) 1 % cream Apply 1 Application topically 2 (two) times daily.   diltiazem  (CARDIZEM  CD) 180 MG 24 hr capsule Take 1 capsule (180 mg total) by mouth daily.   escitalopram  (LEXAPRO ) 10 MG tablet Take 10 mg by mouth daily.   furosemide  (LASIX ) 40 MG tablet Take 40 mg by mouth every morning. 40 mg, oral, once a morning, Lasix , 40 mg, PO q day per new order on 02/01/24   meloxicam (MOBIC) 7.5 MG tablet Take 7.5 mg by mouth daily as needed for pain.   Multiple Vitamin  (MULTIVITAMIN ADULT PO) Take 1 tablet by mouth daily.   nystatin (MYCOSTATIN/NYSTOP) powder Apply 1 Application topically 3 (three) times daily.   OZEMPIC, 0.25 OR 0.5 MG/DOSE, 2 MG/3ML SOPN Inject 0.5 mg into the skin once a week.   potassium chloride  SA (KLOR-CON  M) 20 MEQ tablet Take 20 mEq by mouth daily. 20 meq, oral, once a morning, Potassium, 20meq, PO daily per 02/01/24   Sennosides-Docusate Sodium  (SENNA PLUS PO) Take 1 tablet by mouth daily as needed.   Sodium Fluoride 1.1 % PSTE Place 1 Application onto teeth every evening. Apply pea size amount on tooth brush for oral care.   No facility-administered encounter medications on file as of 09/29/2024.    Review of Systems  Constitutional:  Negative for activity change, appetite change, chills, diaphoresis, fatigue and fever.  HENT:  Negative for congestion.   Respiratory:  Negative for cough, shortness of breath and wheezing.   Cardiovascular:  Positive for leg swelling. Negative for chest pain.  Gastrointestinal:  Negative for abdominal distention, abdominal pain, constipation, diarrhea, nausea and vomiting.  Genitourinary:  Negative for difficulty urinating, dysuria and urgency.  Musculoskeletal:  Positive for arthralgias and gait problem. Negative for back pain, myalgias and neck pain.  Skin:  Positive for color change and rash (feet).  Neurological:  Negative for dizziness and weakness.  Psychiatric/Behavioral:  Negative for confusion.        Memory loss    Immunization History  Administered Date(s) Administered   Fluad  Quad(high Dose 65+) 07/28/2022   Fluad  Trivalent(High Dose 65+) 12/19/2023   Influenza Split 07/26/2009, 07/31/2010, 08/13/2011, 09/02/2012, 09/20/2013, 08/04/2019, 08/24/2020   Influenza,inj,Quad PF,6+ Mos 07/29/2017   Influenza-Unspecified 07/26/2024   Moderna Sars-Covid-2 Vaccination 11/22/2019, 12/06/2019, 08/31/2020, 08/09/2021   PNEUMOCOCCAL CONJUGATE-20 11/24/2022   Pfizer(Comirnaty )Fall Seasonal  Vaccine 12 years and older 08/12/2022   Pneumococcal Conjugate-13 10/02/2014   Pneumococcal Polysaccharide-23 03/21/2009, 04/22/2017   Td 03/21/2009   Tdap 08/18/2019   Unspecified SARS-COV-2 Vaccination 08/06/2024   Zoster Recombinant(Shingrix) 09/13/2019, 10/13/2023   Zoster, Live 03/15/2013, 09/13/2019, 03/06/2020   Pertinent  Health Maintenance Due  Topic Date Due   HEMOGLOBIN A1C  12/08/2024   OPHTHALMOLOGY EXAM  08/29/2025   FOOT EXAM  09/29/2025   Influenza Vaccine  Completed   Bone Density Scan  Discontinued      10/19/2023   11:34 AM  Fall Risk  Falls in the past year? 0  Was there an injury with Fall? 0   Fall Risk Category Calculator 0  Patient at Risk for Falls Due to No Fall Risks  Fall risk Follow up Falls evaluation completed     Data saved with a previous flowsheet row definition   Functional Status Survey:    Vitals:  09/30/24 1101  BP: 126/76  Pulse: 67  Resp: 19  Temp: (!) 97 F (36.1 C)  Weight: 193 lb 6.4 oz (87.7 kg)   Body mass index is 34.26 kg/m. Physical Exam Vitals and nursing note reviewed.  Constitutional:      Appearance: Normal appearance.  HENT:     Head: Normocephalic and atraumatic.  Cardiovascular:     Rate and Rhythm: Normal rate and regular rhythm.     Pulses:          Dorsalis pedis pulses are 1+ on the right side and 1+ on the left side.     Heart sounds: No murmur heard. Pulmonary:     Effort: Pulmonary effort is normal. No respiratory distress.     Breath sounds: Normal breath sounds. No wheezing.  Abdominal:     General: Bowel sounds are normal. There is no distension.     Palpations: Abdomen is soft.     Tenderness: There is no abdominal tenderness.  Musculoskeletal:     Cervical back: No rigidity.     Right lower leg: Edema (+2) present.     Left lower leg: Edema (+2) present.     Right foot: Normal range of motion. No deformity.     Left foot: Normal range of motion. No deformity.  Feet:     Right foot:      Skin integrity: Erythema and dry skin present. No warmth.     Left foot:     Skin integrity: Erythema and dry skin present. No warmth.  Lymphadenopathy:     Cervical: No cervical adenopathy.  Skin:    General: Skin is warm and dry.     Findings: Erythema (erythema with scaly dry skin to toes and mid foot of both feet.) present.  Neurological:     General: No focal deficit present.     Mental Status: She is alert and oriented to person, place, and time. Mental status is at baseline.  Psychiatric:        Mood and Affect: Mood normal.     Labs reviewed: Recent Labs    01/23/24 1247 01/24/24 0333 01/24/24 1431 02/01/24 0000 05/10/24 1509 06/07/24 0000  NA 140 140  --  142 144 140  K 4.0 3.1*   < > 3.9 4.3 3.8  CL 107 105  --  107 104 105  CO2 27 28  --  26* 23 24*  GLUCOSE 129* 115*  --   --  117*  --   BUN 13 12  --  11 13 13   CREATININE 0.43* 0.52  --  0.4* 0.53* 0.5  CALCIUM  8.8* 8.3*  --  8.7 9.5 9.2   < > = values in this interval not displayed.   Recent Labs    01/23/24 1247  AST 30  ALT 15  ALKPHOS 105  BILITOT 1.7*  PROT 5.8*  ALBUMIN 2.7*   Recent Labs    01/23/24 1247 01/24/24 0333 01/29/24 0000 02/01/24 0000 06/07/24 0000  WBC 9.7 9.9 8.4 8.0 7.8  NEUTROABS 5.6  --   --   --   --   HGB 11.7* 11.0* 11.6* 11.3* 12.7  HCT 35.5* 33.2* 35* 34* 38  MCV 92.7 92.7  --   --   --   PLT 291 291 312 316 273   No results found for: TSH Lab Results  Component Value Date   HGBA1C 5.8 06/07/2024   Lab Results  Component Value Date   CHOL  157 12/11/2023   HDL 72 (A) 12/11/2023   LDLCALC 72 12/11/2023   TRIG 66 12/11/2023    Significant Diagnostic Results in last 30 days:  No results found.  Assessment/Plan  Tinea pedis Start clotrimazole  x 4 weeks Try open toed compression hose  Vitamin D  deficiency On supplementation   Type 2 diabetes mellitus with mild nonproliferative diabetic retinopathy without macular edema, bilateral (HCC) CBGS  rising after stopping glipizide but still within acceptable range for her age Continue ozempic.   Essential hypertension Controlled   Aortic stenosis Medical management Followed by cardiology   Primary osteoarthritis of both knees Chronic pain but using prn mobic and tylenol  with no complaints.   Memory loss Short term memory loss Very pleasant and appropriate for skilled care  Chronic diastolic (congestive) heart failure (HCC) Compensated On lasix  Also wears compression hose

## 2024-09-30 NOTE — Assessment & Plan Note (Signed)
 On supplementation

## 2024-09-30 NOTE — Assessment & Plan Note (Signed)
 Start clotrimazole  x 4 weeks Try open toed compression hose

## 2024-09-30 NOTE — Assessment & Plan Note (Signed)
 CBGS rising after stopping glipizide but still within acceptable range for her age Continue ozempic.

## 2024-09-30 NOTE — Assessment & Plan Note (Signed)
 Chronic pain but using prn mobic and tylenol  with no complaints.

## 2024-09-30 NOTE — Assessment & Plan Note (Signed)
 Medical management Followed by cardiology

## 2024-09-30 NOTE — Assessment & Plan Note (Signed)
 Short term memory loss Very pleasant and appropriate for skilled care

## 2024-09-30 NOTE — Assessment & Plan Note (Signed)
 Controlled.

## 2024-10-07 ENCOUNTER — Non-Acute Institutional Stay (SKILLED_NURSING_FACILITY): Payer: Self-pay | Admitting: Adult Health

## 2024-10-07 DIAGNOSIS — L089 Local infection of the skin and subcutaneous tissue, unspecified: Secondary | ICD-10-CM

## 2024-10-07 DIAGNOSIS — R21 Rash and other nonspecific skin eruption: Secondary | ICD-10-CM

## 2024-10-08 ENCOUNTER — Encounter: Payer: Self-pay | Admitting: Adult Health

## 2024-10-08 MED ORDER — TRIAMCINOLONE ACETONIDE 0.1 % EX CREA: 1.0000 | TOPICAL_CREAM | Freq: Two times a day (BID) | CUTANEOUS | Status: AC

## 2024-10-08 MED ORDER — SULFAMETHOXAZOLE-TRIMETHOPRIM 800-160 MG PO TABS: 1.0000 | ORAL_TABLET | Freq: Two times a day (BID) | ORAL | Status: DC

## 2024-10-08 NOTE — Progress Notes (Signed)
 Location:  [facility] Service:  SNF (31) Provider:   Bari America, ANP [facility] [phone]   Charlanne Fredia CROME, MD  Patient Care Team: Charlanne Fredia CROME, MD as PCP - General (Internal Medicine)  Extended Emergency Contact Information Primary Emergency Contact: trotter,[name] Address: [address]          [place], [place] [address] [place] Home Phone: [phone] Work Phone: [phone] Mobile Phone: [phone] Relation: Daughter  Code Status:  DNR Goals of care: Advanced Directive information    06/06/2024   11:16 AM  Advanced Directives  Does Patient Have a Medical Advance Directive? Yes  Type of Advance Directive Out of facility DNR (pink MOST or yellow form)  Does patient want to make changes to medical advance directive? No - Patient declined     Chief Complaint  Patient presents with   Acute Visit    Concern for reaction to cream for feet    HPI:  Pt is a 89 y.o. female seen today for an acute visit for concern for reaction to clotrimazole  applied for tinea infection of the feet.   She was started on clotrimazole  on 12/4.  After 1 week the nurse was noticing worsening redness and speckling rash. No itching or increased swelling. Serous drainage noted on open toe compression hose. Pt with hx of DM II on ozempic.  NO fever or purulent matter.   Past Medical History:  Diagnosis Date   Aortic valve sclerosis 12/09/2021   Diabetes (HCC)    HLD (hyperlipidemia)    HTN (hypertension)    Hypercholesteremia    Low back pain    Memory loss 03/11/2024   Obesities, morbid (HCC)    Polyneuropathy due to type 2 diabetes mellitus (HCC) 03/20/2021   Primary osteoarthritis of both knees 06/06/2024   Recurrent major depressive disorder, in full remission 06/06/2024   Tremor 06/06/2024   Past Surgical History:  Procedure Laterality Date   APPENDECTOMY     TONSILLECTOMY       Allergies[1]  Outpatient Encounter Medications as of 10/07/2024  Medication Sig   sulfamethoxazole -trimethoprim  (BACTRIM  DS) 800-160 MG tablet Take 1 tablet by mouth 2 (two) times daily for 7 days.   triamcinolone  cream (KENALOG ) 0.1 % Apply 1 Application topically 2 (two) times daily for 7 days.   acetaminophen  (TYLENOL ) 500 MG tablet Take 1,000 mg by mouth 3 (three) times daily as needed (reason not listed on MAR).   atorvastatin  (LIPITOR) 20 MG tablet Take 20 mg by mouth daily.   cholecalciferol  (VITAMIN D ) 1000 UNITS tablet Take 1,000 Units by mouth daily.   clotrimazole  (LOTRIMIN ) 1 % cream Apply 1 Application topically 2 (two) times daily.   diltiazem  (CARDIZEM  CD) 180 MG 24 hr capsule Take 1 capsule (180 mg total) by mouth daily.   escitalopram  (LEXAPRO ) 10 MG tablet Take 10 mg by mouth daily.   furosemide  (LASIX ) 40 MG tablet Take 40 mg by mouth every morning. 40 mg, oral, once a morning, Lasix , 40 mg, PO q day per new order on 02/01/24   meloxicam (MOBIC) 7.5 MG tablet Take 7.5 mg by mouth daily as needed for pain.   Multiple Vitamin (MULTIVITAMIN ADULT PO) Take 1 tablet by mouth daily.   nystatin (MYCOSTATIN/NYSTOP) powder Apply 1 Application topically 3 (three) times daily.   OZEMPIC, 0.25 OR 0.5 MG/DOSE, 2 MG/3ML SOPN Inject 0.5 mg into the skin once a week.   potassium chloride  SA (KLOR-CON  M) 20  MEQ tablet Take 20 mEq by mouth daily. 20 meq, oral, once a morning, Potassium, 20meq, PO daily per 02/01/24   Sennosides-Docusate Sodium  (SENNA PLUS PO) Take 1 tablet by mouth daily as needed.   Sodium Fluoride 1.1 % PSTE Place 1 Application onto teeth every evening. Apply pea size amount on tooth brush for oral care.   No facility-administered encounter medications on file as of 10/07/2024.    Review of Systems  Constitutional:  Negative for activity change, appetite change, chills, diaphoresis, fatigue and fever.  HENT:  Negative for congestion.   Respiratory:  Negative for  cough, shortness of breath and wheezing.   Cardiovascular:  Positive for leg swelling. Negative for chest pain.  Gastrointestinal:  Negative for abdominal distention, abdominal pain, constipation, diarrhea, nausea and vomiting.  Genitourinary:  Negative for difficulty urinating, dysuria and urgency.  Musculoskeletal:  Positive for gait problem. Negative for back pain, myalgias and neck pain.  Skin:  Positive for color change and rash.  Neurological:  Negative for dizziness and weakness.  Psychiatric/Behavioral:  Negative for confusion.     Immunization History  Administered Date(s) Administered   Fluad  Quad(high Dose 65+) 07/28/2022   Fluad  Trivalent(High Dose 65+) 12/19/2023   Influenza Split 07/26/2009, 07/31/2010, 08/13/2011, 09/02/2012, 09/20/2013, 08/04/2019, 08/24/2020   Influenza,inj,Quad PF,6+ Mos 07/29/2017   Influenza-Unspecified 07/26/2024   Moderna Sars-Covid-2 Vaccination 11/22/2019, 12/06/2019, 08/31/2020, 08/09/2021   PNEUMOCOCCAL CONJUGATE-20 11/24/2022   Pfizer(Comirnaty )Fall Seasonal Vaccine 12 years and older 08/12/2022   Pneumococcal Conjugate-13 10/02/2014   Pneumococcal Polysaccharide-23 03/21/2009, 04/22/2017   Td 03/21/2009   Tdap 08/18/2019   Unspecified SARS-COV-2 Vaccination 08/06/2024   Zoster Recombinant(Shingrix) 09/13/2019, 10/13/2023   Zoster, Live 03/15/2013, 09/13/2019, 03/06/2020   Pertinent  Health Maintenance Due  Topic Date Due   HEMOGLOBIN A1C  12/08/2024   OPHTHALMOLOGY EXAM  08/29/2025   FOOT EXAM  09/29/2025   Influenza Vaccine  Completed   Bone Density Scan  Discontinued      10/19/2023   11:34 AM  Fall Risk  Falls in the past year? 0  Was there an injury with Fall? 0   Fall Risk Category Calculator 0  Patient at Risk for Falls Due to No Fall Risks  Fall risk Follow up Falls evaluation completed     Data saved with a previous flowsheet row definition   Functional Status Survey:    Vitals:   10/08/24 0800  BP: (!) 104/51   Pulse: 74  Resp: 20  Temp: 97.9 F (36.6 C)  SpO2: 94%   There is no height or weight on file to calculate BMI.  Physical Exam Vitals and nursing note reviewed.  Constitutional:      Appearance: Normal appearance.  Musculoskeletal:     Right lower leg: Edema present.     Left lower leg: Edema present.  Skin:    General: Skin is warm and dry.  Neurological:     General: No focal deficit present.     Mental Status: She is alert. Mental status is at baseline.     Labs reviewed: Recent Labs    01/23/24 1247 01/24/24 0333 01/24/24 1431 02/01/24 0000 05/10/24 1509 06/07/24 0000  NA 140 140  --  142 144 140  K 4.0 3.1*   < > 3.9 4.3 3.8  CL 107 105  --  107 104 105  CO2 27 28  --  26* 23 24*  GLUCOSE 129* 115*  --   --  117*  --   BUN 13 12  --  11 13 13   CREATININE 0.43* 0.52  --  0.4* 0.53* 0.5  CALCIUM  8.8* 8.3*  --  8.7 9.5 9.2   < > = values in this interval not displayed.   Recent Labs    01/23/24 1247  AST 30  ALT 15  ALKPHOS 105  BILITOT 1.7*  PROT 5.8*  ALBUMIN 2.7*   Recent Labs    01/23/24 1247 01/24/24 0333 01/29/24 0000 02/01/24 0000 06/07/24 0000  WBC 9.7 9.9 8.4 8.0 7.8  NEUTROABS 5.6  --   --   --   --   HGB 11.7* 11.0* 11.6* 11.3* 12.7  HCT 35.5* 33.2* 35* 34* 38  MCV 92.7 92.7  --   --   --   PLT 291 291 312 316 273   No results found for: TSH Lab Results  Component Value Date   HGBA1C 5.8 06/07/2024   Lab Results  Component Value Date   CHOL 157 12/11/2023   HDL 72 (A) 12/11/2023   LDLCALC 72 12/11/2023   TRIG 66 12/11/2023    Significant Diagnostic Results in last 30 days:  No results found.  Assessment/Plan 1. Foot infection (Primary) Concern for reaction to clotrimazole  vs superimposed bacterial skin infection with hx of diabetes Favor treatment with bactrim , hold clotrimazole  and recheck Monday  - sulfamethoxazole -trimethoprim  (BACTRIM  DS) 800-160 MG tablet; Take 1 tablet by mouth 2 (two) times daily for 7  days.   2. Rash Concern for reaction to clotrimazole  add steroid cream   - triamcinolone  cream (KENALOG ) 0.1 %; Apply 1 Application topically 2 (two) times daily for 7 days.       [1]  Allergies Allergen Reactions   Penicillin G Anaphylaxis    Throat closing up   Arthro-Complex [Nutritional Supplements] Other (See Comments)    Allergy not listed on MAR    Empagliflozin Other (See Comments)    Yeast infection   Levaquin [Levofloxacin In D5w] Other (See Comments)    Reaction not listed on MAR    Levofloxacin Other (See Comments)    Reaction not listed on MAR    Lipitor [Atorvastatin ] Other (See Comments)    Allergy not listed on MAR    Metformin Hcl Other (See Comments)    Stomach cramps   Terbinafine Other (See Comments)    Reaction not listed on [facility]

## 2024-10-11 ENCOUNTER — Non-Acute Institutional Stay: Payer: Self-pay | Admitting: Orthopedic Surgery

## 2024-10-11 ENCOUNTER — Encounter: Payer: Self-pay | Admitting: Orthopedic Surgery

## 2024-10-11 DIAGNOSIS — F3342 Major depressive disorder, recurrent, in full remission: Secondary | ICD-10-CM | POA: Diagnosis not present

## 2024-10-11 DIAGNOSIS — I5032 Chronic diastolic (congestive) heart failure: Secondary | ICD-10-CM

## 2024-10-11 DIAGNOSIS — Z7985 Long-term (current) use of injectable non-insulin antidiabetic drugs: Secondary | ICD-10-CM

## 2024-10-11 DIAGNOSIS — L089 Local infection of the skin and subcutaneous tissue, unspecified: Secondary | ICD-10-CM

## 2024-10-11 DIAGNOSIS — E113293 Type 2 diabetes mellitus with mild nonproliferative diabetic retinopathy without macular edema, bilateral: Secondary | ICD-10-CM

## 2024-10-11 MED ORDER — DOXYCYCLINE HYCLATE 100 MG PO TABS: 100.0000 mg | ORAL_TABLET | Freq: Two times a day (BID) | ORAL | Status: AC

## 2024-10-11 NOTE — Progress Notes (Signed)
 Location:  [facility] Room Number: 118/A Place of Service:  SNF [phone]) Provider:  Greig FORBES Cluster, NP   Charlanne Fredia CROME, MD  Patient Care Team: Charlanne Fredia CROME, MD as PCP - General (Internal Medicine)  Extended Emergency Contact Information Primary Emergency Contact: trotter,[name] Address: [address]          [place], [place] [address] [place] Home Phone: [phone] Work Phone: [phone] Mobile Phone: [phone] Relation: Daughter  Code Status:  DNR Goals of care: Advanced Directive information    06/06/2024   11:16 AM  Advanced Directives  Does Patient Have a Medical Advance Directive? Yes  Type of Advance Directive Out of facility DNR (pink MOST or yellow form)  Does patient want to make changes to medical advance directive? No - Patient declined     Chief Complaint  Patient presents with   Acute Visit    Foot infection    HPI:  Pt is a 89 y.o. female seen today for acute visit due to foot infection.   She currently resides on the skilled nursing unit at [facility]. PMH: aortic stenosis, HTN, HLD venous insufficiency, T2DM, and obesity.   12/12 she was started on Bactrim  due to suspected right dorsal redness and discomfort. Today, redness and discomfort have improved. She no has increased redness, warmth and pain to right shin/calf extending up to knee. No recent injury or fall. Overall skin appearance to RLE dry and cracked. She was also treated with clotrimazole  cream for suspected fungal infection. Leonarda. Vitals stable.    A1c 5.8 06/07/2024. Remains on Ozempic. No recent hypoglycemia.   LVEF 60-65%, grade II DD 12/2023. Remains on furosemide .   No mood changes, remains on Lexapro . Na+ 140 05/2024.     Past Medical History:  Diagnosis Date   Aortic valve sclerosis 12/09/2021   Diabetes (HCC)    HLD (hyperlipidemia)    HTN (hypertension)    Hypercholesteremia    Low back pain    Memory loss  03/11/2024   Obesities, morbid (HCC)    Polyneuropathy due to type 2 diabetes mellitus (HCC) 03/20/2021   Primary osteoarthritis of both knees 06/06/2024   Recurrent major depressive disorder, in full remission 06/06/2024   Tremor 06/06/2024   Past Surgical History:  Procedure Laterality Date   APPENDECTOMY     TONSILLECTOMY      Allergies[1]  Outpatient Encounter Medications as of 10/11/2024  Medication Sig   acetaminophen  (TYLENOL ) 500 MG tablet Take 1,000 mg by mouth 3 (three) times daily as needed (reason not listed on MAR).   atorvastatin  (LIPITOR) 20 MG tablet Take 20 mg by mouth daily.   cholecalciferol  (VITAMIN D ) 1000 UNITS tablet Take 1,000 Units by mouth daily.   diltiazem  (CARDIZEM  CD) 180 MG 24 hr capsule Take 1 capsule (180 mg total) by mouth daily.   escitalopram  (LEXAPRO ) 10 MG tablet Take 10 mg by mouth daily.   furosemide  (LASIX ) 40 MG tablet Take 40 mg by mouth every morning. 40 mg, oral, once a morning, Lasix , 40 mg, PO q day per new order on 02/01/24   meloxicam (MOBIC) 7.5 MG tablet Take 7.5 mg by mouth daily as needed for pain.   Multiple Vitamin (MULTIVITAMIN ADULT PO) Take 1 tablet by mouth daily.   nystatin (MYCOSTATIN/NYSTOP) powder Apply 1 Application topically 3 (three) times daily.   OZEMPIC, 0.25 OR 0.5 MG/DOSE, 2 MG/3ML SOPN Inject 0.5 mg into the skin once a week.   potassium chloride  SA (  KLOR-CON  M) 20 MEQ tablet Take 20 mEq by mouth daily. 20 meq, oral, once a morning, Potassium, 20meq, PO daily per 02/01/24   Sennosides-Docusate Sodium  (SENNA PLUS PO) Take 1 tablet by mouth daily as needed.   Sodium Fluoride 1.1 % PSTE Place 1 Application onto teeth every evening. Apply pea size amount on tooth brush for oral care.   sulfamethoxazole -trimethoprim  (BACTRIM  DS) 800-160 MG tablet Take 1 tablet by mouth 2 (two) times daily for 7 days.   triamcinolone  cream (KENALOG ) 0.1 % Apply 1 Application topically 2 (two) times daily for 7 days.   No  facility-administered encounter medications on file as of 10/11/2024.    Review of Systems  Constitutional:  Negative for fatigue and fever.  HENT:  Negative for congestion and trouble swallowing.   Respiratory:  Negative for cough and shortness of breath.   Cardiovascular:  Positive for leg swelling. Negative for chest pain.  Gastrointestinal:  Negative for abdominal distention and abdominal pain.  Genitourinary:  Negative for dysuria and hematuria.  Musculoskeletal:  Positive for gait problem.  Skin:  Positive for color change.  Neurological:  Negative for dizziness and headaches.  Psychiatric/Behavioral:  Positive for dysphoric mood. Negative for sleep disturbance. The patient is not nervous/anxious.     Immunization History  Administered Date(s) Administered   Fluad  Quad(high Dose 65+) 07/28/2022   Fluad  Trivalent(High Dose 65+) 12/19/2023   Influenza Split 07/26/2009, 07/31/2010, 08/13/2011, 09/02/2012, 09/20/2013, 08/04/2019, 08/24/2020   Influenza,inj,Quad PF,6+ Mos 07/29/2017   Influenza-Unspecified 07/26/2024   Moderna Sars-Covid-2 Vaccination 11/22/2019, 12/06/2019, 08/31/2020, 08/09/2021   PNEUMOCOCCAL CONJUGATE-20 11/24/2022   Pfizer(Comirnaty )Fall Seasonal Vaccine 12 years and older 08/12/2022   Pneumococcal Conjugate-13 10/02/2014   Pneumococcal Polysaccharide-23 03/21/2009, 04/22/2017   Td 03/21/2009   Tdap 08/18/2019   Unspecified SARS-COV-2 Vaccination 08/06/2024   Zoster Recombinant(Shingrix) 09/13/2019, 10/13/2023   Zoster, Live 03/15/2013, 09/13/2019, 03/06/2020   Pertinent  Health Maintenance Due  Topic Date Due   HEMOGLOBIN A1C  12/08/2024   OPHTHALMOLOGY EXAM  08/29/2025   FOOT EXAM  09/29/2025   Influenza Vaccine  Completed   Bone Density Scan  Discontinued      10/19/2023   11:34 AM  Fall Risk  Falls in the past year? 0  Was there an injury with Fall? 0   Fall Risk Category Calculator 0  Patient at Risk for Falls Due to No Fall Risks  Fall  risk Follow up Falls evaluation completed     Data saved with a previous flowsheet row definition   Functional Status Survey:    Vitals:   10/11/24 1558  BP: 131/76  Pulse: 80  Resp: 18  Temp: (!) 97.4 F (36.3 C)  SpO2: 95%  Weight: 190 lb 6.4 oz (86.4 kg)  Height: 5' 3 (1.6 m)   Body mass index is 33.73 kg/m. Physical Exam Vitals reviewed.  Constitutional:      Appearance: She is obese.  HENT:     Head: Normocephalic.  Eyes:     General:        Right eye: No discharge.        Left eye: No discharge.  Cardiovascular:     Rate and Rhythm: Normal rate and regular rhythm.     Pulses: Normal pulses.     Heart sounds: Normal heart sounds.  Pulmonary:     Effort: Pulmonary effort is normal.     Breath sounds: Normal breath sounds.  Abdominal:     Palpations: Abdomen is soft.  Musculoskeletal:  Cervical back: Neck supple.     Right lower leg: Edema present.     Left lower leg: Edema present.  Skin:    General: Skin is warm.     Capillary Refill: Capillary refill takes less than 2 seconds.     Findings: Erythema present.     Comments: Right foot dorsal redness improved, increased redness to right shin/calf extending towards knee, extremity hot to touch with mild swelling  Neurological:     General: No focal deficit present.     Mental Status: She is alert and oriented to person, place, and time.     Gait: Gait abnormal.  Psychiatric:        Mood and Affect: Mood normal.     Labs reviewed: Recent Labs    01/23/24 1247 01/24/24 0333 01/24/24 1431 02/01/24 0000 05/10/24 1509 06/07/24 0000  NA 140 140  --  142 144 140  K 4.0 3.1*   < > 3.9 4.3 3.8  CL 107 105  --  107 104 105  CO2 27 28  --  26* 23 24*  GLUCOSE 129* 115*  --   --  117*  --   BUN 13 12  --  11 13 13   CREATININE 0.43* 0.52  --  0.4* 0.53* 0.5  CALCIUM  8.8* 8.3*  --  8.7 9.5 9.2   < > = values in this interval not displayed.   Recent Labs    01/23/24 1247  AST 30  ALT 15  ALKPHOS  105  BILITOT 1.7*  PROT 5.8*  ALBUMIN 2.7*   Recent Labs    01/23/24 1247 01/24/24 0333 01/29/24 0000 02/01/24 0000 06/07/24 0000  WBC 9.7 9.9 8.4 8.0 7.8  NEUTROABS 5.6  --   --   --   --   HGB 11.7* 11.0* 11.6* 11.3* 12.7  HCT 35.5* 33.2* 35* 34* 38  MCV 92.7 92.7  --   --   --   PLT 291 291 312 316 273   No results found for: TSH Lab Results  Component Value Date   HGBA1C 5.8 06/07/2024   Lab Results  Component Value Date   CHOL 157 12/11/2023   HDL 72 (A) 12/11/2023   LDLCALC 72 12/11/2023   TRIG 66 12/11/2023    Significant Diagnostic Results in last 30 days:  No results found.  Assessment/Plan 1. Foot infection (Primary) - 12/12 Bactrim  started for right dorsal redness> improved - increased redness to right shin/calf, warmth and mild swelling, discomfort  - concerns for cellulitis  - discontinue Bactrim  and start doxycycline   - doxycycline  (VIBRA -TABS) 100 MG tablet; Take 1 tablet (100 mg total) by mouth 2 (two) times daily for 10 days.  2. Type 2 diabetes mellitus with both eyes affected by mild nonproliferative retinopathy without macular edema, without long-term current use of insulin  (HCC) - no hypoglycemia - A1c 5.8 - cont Ozempic - cont diet low in carbs and sugars  3. Chronic diastolic (congestive) heart failure (HCC) - BLE edema, lung sounds clear  - cont furosemide    4. Recurrent major depressive disorder, in full remission - pleasant mood - cont Lexapro      Family/ staff Communication: plan discussed with patient and nurse  Labs/tests ordered:  none      [1]  Allergies Allergen Reactions   Penicillin G Anaphylaxis    Throat closing up   Arthro-Complex [Nutritional Supplements] Other (See Comments)    Allergy not listed on MAR    Empagliflozin Other (  See Comments)    Yeast infection   Levaquin [Levofloxacin In D5w] Other (See Comments)    Reaction not listed on MAR    Levofloxacin Other (See Comments)    Reaction not  listed on MAR    Lipitor [Atorvastatin ] Other (See Comments)    Allergy not listed on MAR    Metformin Hcl Other (See Comments)    Stomach cramps   Terbinafine Other (See Comments)    Reaction not listed on [facility]

## 2024-10-21 ENCOUNTER — Non-Acute Institutional Stay (SKILLED_NURSING_FACILITY): Payer: Self-pay | Admitting: Internal Medicine

## 2024-10-21 ENCOUNTER — Encounter: Payer: Self-pay | Admitting: Internal Medicine

## 2024-10-21 DIAGNOSIS — I872 Venous insufficiency (chronic) (peripheral): Secondary | ICD-10-CM | POA: Diagnosis not present

## 2024-10-21 DIAGNOSIS — Z7985 Long-term (current) use of injectable non-insulin antidiabetic drugs: Secondary | ICD-10-CM | POA: Diagnosis not present

## 2024-10-21 DIAGNOSIS — I10 Essential (primary) hypertension: Secondary | ICD-10-CM | POA: Diagnosis not present

## 2024-10-21 DIAGNOSIS — E1142 Type 2 diabetes mellitus with diabetic polyneuropathy: Secondary | ICD-10-CM

## 2024-10-21 DIAGNOSIS — I350 Nonrheumatic aortic (valve) stenosis: Secondary | ICD-10-CM

## 2024-10-21 NOTE — Assessment & Plan Note (Addendum)
 Grade 2.5-3 systolic murmur present.  She indicated she was unaware of having a heart murmur ; yet Dr. Lavona had discussed this with her in detail in July.  Cardiology follow-up scheduled for January 2026.

## 2024-10-21 NOTE — Assessment & Plan Note (Addendum)
 Diabetic control had been excellent as documented by an A1c of 5.8% on 06/07/24 . A1c update indicated.  Continue Ozempic. She validates LE neuropathy.

## 2024-10-21 NOTE — Patient Instructions (Signed)
 See assessment and plan under each diagnosis in the problem list and acutely for this visit

## 2024-10-21 NOTE — Progress Notes (Signed)
 "   NURSING HOME LOCATION: [facility] ROOM NUMBER:  [id number]  CODE STATUS:  DNR  PCP: Charlanne Fredia CROME, MD   This is a nursing facility follow up visit for specific acute issue of right lower extremity cellulitis..  Interim medical record and care since last SNF visit was updated with review of diagnostic studies and change in clinical status since last visit were documented.  HPI: Staff was concerned because of persistent swelling and erythema in the right lower extremity despite having taking a course of Bactrim  for toe infection and 10 days of doxycycline . They feel that clinically it is improving but are concerned about residual infection.  The patient validates that there is been significant improvement and she is not having significant discomfort in the leg.She validates having scratched the medial RLE resulting in excoriations.  Review of systems:She validates peripheral neuropathy of lower extremities.  Constitutional: No fever, significant weight change, fatigue  Eyes: No redness, discharge, pain, vision change ENT/mouth: No nasal congestion,  purulent discharge, earache, change in hearing, sore throat  Cardiovascular: No chest pain, palpitations, paroxysmal nocturnal dyspnea, claudication, edema  Respiratory: No cough, sputum production, hemoptysis, DOE, significant snoring, apnea   Gastrointestinal: No heartburn, dysphagia, abdominal pain, nausea /vomiting, rectal bleeding, melena, change in bowels Genitourinary: No dysuria, hematuria, pyuria, incontinence, nocturia Musculoskeletal: No joint stiffness, joint swelling, weakness, pain Neurologic: No dizziness, headache, syncope, seizures Psychiatric: No significant anxiety, depression, insomnia, anorexia Endocrine: No change in hair/skin/nails, excessive thirst, excessive hunger, excessive urination  Hematologic/lymphatic: No significant bruising, lymphadenopathy, abnormal bleeding Allergy/immunology: No itchy/watery eyes, significant  sneezing, urticaria, angioedema  Physical exam: Hair is thin over the crown.  Grade 2.5 systolic murmur is present at the base.  There is 1.5+ edema of the left lower extremity and 1+ in the right lower extremity.  Pedal pulses are not palpable.  There is hyperpigmentation in a stocking distribution of the right lower extremity with some blanching to pressure.  Excoriations are present above the right medial ankle area.  There is faint hyperpigmentation in the left lower extremity.  General appearance: Adequately nourished; no acute distress, increased work of breathing is present.   Lymphatic: No lymphadenopathy about the head, neck, axilla. Eyes: No conjunctival inflammation or lid edema is present. There is no scleral icterus. Ears:  External ear exam shows no significant lesions or deformities.   Nose:  External nasal examination shows no deformity or inflammation. Nasal mucosa are pink and moist without lesions, exudates Oral exam:  Lips and gums are healthy appearing. There is no oropharyngeal erythema or exudate. Neck:  No thyromegaly, masses, tenderness noted.    Heart:  Normal rate and regular rhythm. S1 and S2 normal without gallop,click, rub .  Lungs: Chest clear to auscultation without wheezes, rhonchi, rales, rubs. Abdomen: Bowel sounds are normal. Abdomen is soft and nontender with no organomegaly, hernias, masses. GU: Deferred  Extremities:  No cyanosis, clubbing Neurologic exam :Balance, Rhomberg, finger to nose testing could not be completed due to clinical state Skin: Warm & dry w/o tenting. No significant rash.  See summary under each active problem in the Problem List with associated updated therapeutic plan :  Peripheral venous insufficiency Asymmetric edema is present in the lower extremities.  Clinically significant cellulitis is not present.  Elevation encouraged.  Aortic stenosis Grade 2.5-3 systolic murmur present.  She indicated she was unaware of having a heart  murmur ; yet Dr. Lavona had discussed this with her in detail in July.  Cardiology follow-up scheduled  for January 2026.  Essential hypertension Blood pressure is excellent.  The med list was reviewed; it appears that she was formally on diltiazem  but not presently.  Blood pressures will be monitored and need for the CCB determined.   Polyneuropathy due to type 2 diabetes mellitus (HCC) Diabetic control had been excellent as documented by an A1c of 5.8% on 06/07/24 . A1c update indicated.  Continue Ozempic. She validates LE neuropathy.   "

## 2024-10-21 NOTE — Assessment & Plan Note (Addendum)
 Blood pressure is excellent.  The med list was reviewed; it appears that she was formally on diltiazem  but not presently.  Blood pressures will be monitored and need for the CCB determined.

## 2024-10-21 NOTE — Assessment & Plan Note (Signed)
 Asymmetric edema is present in the lower extremities.  Clinically significant cellulitis is not present.  Elevation encouraged.

## 2024-11-07 NOTE — Progress Notes (Unsigned)
 " Cardiology Office Note   Date:  11/08/2024  ID:  Patricia Petersen, DOB 1934-08-28, MRN [id number] PCP: Charlanne Fredia CROME, MD  [facility] Providers Cardiologist:  None   History of Present Illness Patricia Petersen is a 89 y.o. female who presents for evaluation of aortic stenosis.  Echo in March 2025 demonstrated an EF of 60 - 65%.  There is moderately elevated pulmonary pressures.  AS was severe with a gradient of 42 mm and VTI of 0.92.  She was in the hospital with acute on chronic congestive heart failure.  She lives at [facility].  I saw her previously for a murmur.    Echo in March demonstrated an EF of 65% with severe AS with a mean gradient of 42 and VTI of  0.92.     She was seen 05/10/24, The patient's had no new symptoms since she was last seen.  [facility]  She really is wheelchair-bound.  She feels well. The patient denies any new symptoms such as chest discomfort, neck or arm discomfort. There has been no new shortness of breath, PND or orthopnea. There have been no reported palpitations, presyncope or syncope.    Today, she  presents with severe aortic stenosis for follow-up of her heart condition.  She was diagnosed with severe aortic stenosis over a year ago after an episode of shortness of breath that led to an ER visit, with an echocardiogram obtained at that time. Since then she has been managed medically and has had no chest pain, shortness of breath, palpitations, or syncope.  Her current cardiac medications are Lasix  40 mg daily with a potassium supplement, Lipitor, and Cardizem  180 mg. She reports no significant side effects.  She notes chronic swelling in her feet, worse on the left, with a recent toe infection. She uses leg elevation and compression and follows a low-salt diet. She stays well hydrated.  Her kidney function in August was reported as good. A lipid panel from February showed LDL 72, at goal for her  cholesterol management.  Reports no shortness of breath nor dyspnea on exertion. Reports no chest pain, pressure, or tightness. No edema, orthopnea, PND. Reports no palpitations.   Discussed the use of AI scribe software for clinical note transcription with the patient, who gave verbal consent to proceed.   ROS: pertinent ROS in HPI  Studies Reviewed     Echo March 2025  IMPRESSIONS     1. Left ventricular ejection fraction, by estimation, is 60 to 65%. The  left ventricle has normal function. The left ventricle has no regional  wall motion abnormalities. There is mild concentric left ventricular  hypertrophy. Left ventricular diastolic  parameters are consistent with Grade II diastolic dysfunction  (pseudonormalization).   2. Right ventricular systolic function is hyperdynamic. The right  ventricular size is normal. There is moderately elevated pulmonary artery  systolic pressure.   3. Left atrial size was moderately dilated.   4. The mitral valve is grossly normal. Trivial mitral valve  regurgitation. The mean mitral valve gradient is 3.0 mmHg.   5. Aortic valve calcification . The aortic valve is calcified. Aortic  valve regurgitation is not visualized. Severe aortic valve stenosis.  Aortic valve area, by VTI measures 0.92 cm. Aortic valve mean gradient  measures 42.0 mmHg. Aortic valve Vmax  measures 4.13 m/s. Aortic valve acceleration time measures 120 msec.   6. The inferior vena cava is dilated in size  with <50% respiratory  variability, suggesting right atrial pressure of 15 mmHg.   Comparison(s): Prior images unable to be directly viewed, comparison made  by report only. In comparison from 2016 report- aortic sclerosis has  progressed to severe aortic stenosis.   FINDINGS   Left Ventricle: No ventricular strain or 3D. Left ventricular ejection  fraction, by estimation, is 60 to 65%. The left ventricle has normal  function. The left ventricle has no regional wall  motion abnormalities.  Strain was performed and the global  longitudinal strain is indeterminate. The left ventricular internal cavity  size was normal in size. There is mild concentric left ventricular  hypertrophy. Left ventricular diastolic parameters are consistent with  Grade II diastolic dysfunction  (pseudonormalization).   Right Ventricle: The right ventricular size is normal. No increase in  right ventricular wall thickness. Right ventricular systolic function is  hyperdynamic. There is moderately elevated pulmonary artery systolic  pressure. The tricuspid regurgitant  velocity is 2.93 m/s, and with an assumed right atrial pressure of 15  mmHg, the estimated right ventricular systolic pressure is 49.3 mmHg.   Left Atrium: Left atrial size was moderately dilated.   Right Atrium: Right atrial size was normal in size.   Pericardium: Trivial pericardial effusion is present. The pericardial  effusion is anterior to the right ventricle.   Mitral Valve: The mitral valve is grossly normal. Trivial mitral valve  regurgitation. MV peak gradient, 6.7 mmHg. The mean mitral valve gradient  is 3.0 mmHg.   Tricuspid Valve: The tricuspid valve is normal in structure. Tricuspid  valve regurgitation is not demonstrated. No evidence of tricuspid  stenosis.   Aortic Valve: Aortic valve calcification. The aortic valve is calcified.  Aortic valve regurgitation is not visualized. Severe aortic stenosis is  present. Aortic valve mean gradient measures 42.0 mmHg. Aortic valve peak  gradient measures 68.2 mmHg. Aortic   valve area, by VTI measures 0.92 cm.   Pulmonic Valve: The pulmonic valve was grossly normal. Pulmonic valve  regurgitation is not visualized. No evidence of pulmonic stenosis.   Aorta: The aortic root and ascending aorta are structurally normal, with  no evidence of dilitation.   Venous: The inferior vena cava is dilated in size with less than 50%  respiratory variability,  suggesting right atrial pressure of 15 mmHg.   IAS/Shunts: The atrial septum is grossly normal.   Additional Comments: 3D was performed not requiring image post processing  on an independent workstation and was indeterminate.       Physical Exam VS:  BP (!) 116/58   Pulse 68   Ht 5' 5 (1.651 m)   Wt 195 lb (88.5 kg)   SpO2 96%   BMI 32.45 kg/m        Wt Readings from Last 3 Encounters:  11/08/24 195 lb (88.5 kg)  10/11/24 190 lb 6.4 oz (86.4 kg)  09/30/24 193 lb 6.4 oz (87.7 kg)    GEN: Well nourished, well developed in no acute distress NECK: No JVD; No carotid bruits CARDIAC: RRR, no murmurs, rubs, gallops RESPIRATORY:  Clear to auscultation without rales, wheezing or rhonchi  ABDOMEN: Soft, non-tender, non-distended EXTREMITIES:  pitting LE edema L > R; No deformity   ASSESSMENT AND PLAN  Severe nonrheumatic aortic valve stenosis Severe stenosis with calcification. No symptoms of lightheadedness, dizziness, or shortness of breath. Current management plan satisfactory. - Continue Lasix  40 mg once daily, Lipitor, and Cardizem  180 mg. - Monitor for symptoms of lightheadedness, dizziness, or shortness of  breath. - Maintain current Lasix  dose to conserve kidney function. - Encouraged use of compression stockings and leg elevation for fluid retention.  Dyslipidemia LDL cholesterol at 72 mg/dL, within target range. Lipitor effective. - Continue Lipitor. - Recommended lipid panel at [facility]..   Chronic LE edema -reviewed labs and renal function preserved on current dose of lasix .  -continue lasix  40mg  daily -elevate feet as able -wear daily compression socks     Dispo: She can follow-up in 6 months with MD  Signed, Orren LOISE Fabry, PA-C   "

## 2024-11-08 ENCOUNTER — Encounter: Payer: Self-pay | Admitting: Physician Assistant

## 2024-11-08 ENCOUNTER — Ambulatory Visit: Attending: Physician Assistant | Admitting: Physician Assistant

## 2024-11-08 VITALS — BP 116/58 | HR 68 | Ht 65.0 in | Wt 195.0 lb

## 2024-11-08 DIAGNOSIS — E785 Hyperlipidemia, unspecified: Secondary | ICD-10-CM | POA: Diagnosis not present

## 2024-11-08 DIAGNOSIS — I10 Essential (primary) hypertension: Secondary | ICD-10-CM

## 2024-11-08 DIAGNOSIS — I350 Nonrheumatic aortic (valve) stenosis: Secondary | ICD-10-CM | POA: Diagnosis not present

## 2024-11-08 DIAGNOSIS — R600 Localized edema: Secondary | ICD-10-CM

## 2024-11-08 MED ORDER — FUROSEMIDE 40 MG PO TABS: 40.0000 mg | ORAL_TABLET | Freq: Every morning | ORAL | 1 refills | Status: AC

## 2024-11-08 MED ORDER — ATORVASTATIN CALCIUM 20 MG PO TABS: 20.0000 mg | ORAL_TABLET | Freq: Every day | ORAL | 1 refills | Status: AC

## 2024-11-08 MED ORDER — DILTIAZEM HCL ER COATED BEADS 180 MG PO CP24: 180.0000 mg | ORAL_CAPSULE | Freq: Every day | ORAL | 1 refills | Status: AC

## 2024-11-08 MED ORDER — POTASSIUM CHLORIDE CRYS ER 20 MEQ PO TBCR: 20.0000 meq | EXTENDED_RELEASE_TABLET | Freq: Every day | ORAL | 1 refills | Status: AC

## 2024-11-08 NOTE — Patient Instructions (Addendum)
 Medication Instructions:  Your physician recommends that you continue on your current medications as directed. Please refer to the Current Medication list given to you today. *If you need a refill on your cardiac medications before your next appointment, please call your pharmacy*  Lab Work: Lipids at Facility at your convenience  If you have labs (blood work) drawn today and your tests are completely normal, you will receive your results only by: MyChart Message (if you have MyChart) OR A paper copy in the mail If you have any lab test that is abnormal or we need to change your treatment, we will call you to review the results.  Testing/Procedures: None ordered  Follow-Up: At [facility], you and your health needs are our priority.  As part of our continuing mission to provide you with exceptional heart care, our providers are all part of one team.  This team includes your primary Cardiologist (physician) and Advanced Practice Providers or APPs (Physician Assistants and Nurse Practitioners) who all work together to provide you with the care you need, when you need it.  Your next appointment:   6 month(s)  Provider:   Lynwood Schilling, MD   We recommend signing up for the patient portal called MyChart.  Sign up information is provided on this After Visit Summary.  MyChart is used to connect with patients for Virtual Visits (Telemedicine).  Patients are able to view lab/test results, encounter notes, upcoming appointments, etc.  Non-urgent messages can be sent to your provider as well.   To learn more about what you can do with MyChart, go to [email or web address].   Other Instructions

## 2024-11-10 ENCOUNTER — Encounter: Payer: Self-pay | Admitting: Podiatry

## 2024-11-10 LAB — LIPID PANEL
Cholesterol: 170 (ref 0–200)
HDL: 75 — AB (ref 35–70)
LDL Cholesterol: 83
LDl/HDL Ratio: 2.3
Triglycerides: 63 (ref 40–160)

## 2024-11-14 ENCOUNTER — Encounter: Payer: Self-pay | Admitting: Adult Health

## 2024-11-14 ENCOUNTER — Non-Acute Institutional Stay (SKILLED_NURSING_FACILITY): Admitting: Adult Health

## 2024-11-14 DIAGNOSIS — I5032 Chronic diastolic (congestive) heart failure: Secondary | ICD-10-CM | POA: Diagnosis not present

## 2024-11-14 DIAGNOSIS — Z7985 Long-term (current) use of injectable non-insulin antidiabetic drugs: Secondary | ICD-10-CM | POA: Diagnosis not present

## 2024-11-14 DIAGNOSIS — I10 Essential (primary) hypertension: Secondary | ICD-10-CM

## 2024-11-14 DIAGNOSIS — E113293 Type 2 diabetes mellitus with mild nonproliferative diabetic retinopathy without macular edema, bilateral: Secondary | ICD-10-CM | POA: Diagnosis not present

## 2024-11-14 DIAGNOSIS — R413 Other amnesia: Secondary | ICD-10-CM | POA: Diagnosis not present

## 2024-11-14 DIAGNOSIS — B353 Tinea pedis: Secondary | ICD-10-CM | POA: Diagnosis not present

## 2024-11-14 DIAGNOSIS — I350 Nonrheumatic aortic (valve) stenosis: Secondary | ICD-10-CM

## 2024-11-14 DIAGNOSIS — M170 Bilateral primary osteoarthritis of knee: Secondary | ICD-10-CM | POA: Diagnosis not present

## 2024-11-14 NOTE — Assessment & Plan Note (Signed)
 Compensated On lasix  Also wears compression hose

## 2024-11-14 NOTE — Assessment & Plan Note (Signed)
 Chronic pain but using prn mobic and tylenol  with no complaints.

## 2024-11-14 NOTE — Assessment & Plan Note (Signed)
 Improving, extend course of diflucan to 14 days

## 2024-11-14 NOTE — Progress Notes (Signed)
 " Location[facility] Room Number: 118 P Place of Service:  SNF (31) Provider: Tawni America, NP   Patient Care Team: Charlanne Fredia CROME, MD as PCP - General (Internal Medicine)  Extended Emergency Contact Information Primary Emergency Contact: trotter,[name] Address: [address]          [place], [place] [address] [place] Home Phone: [phone] Work Phone: [phone] Mobile Phone: [phone] Relation: Daughter  Code Status:  DNR Goals of care: Advanced Directive information    06/06/2024   11:16 AM  Advanced Directives  Does Patient Have a Medical Advance Directive? Yes  Type of Advance Directive Out of facility DNR (pink MOST or yellow form)  Does patient want to make changes to medical advance directive? No - Patient declined     Chief Complaint  Patient presents with   Routine Visit    HPI:  Pt is a 89 y.o. female seen today for medical management of chronic diseases.    Resides in skilled care due to mobility issues associated with OA of the knee and cognitive impairment  She has had issues with fungal infection to both feet and abd folds. Currently she is on diflucan with improvement Prior to this she was on clotrimazole  and had increased redness and possible reaction. Later this was felt to have turned into a bacterial infection and she was treated with Bactrim  and later changed to doxycycline  due to efficacy   Very pleasant. Able to make needs known and follow commands. Lift for transfers.  MMSE 24/30 01/28/24  Type 2 DM: on ozempic and off glipizide, CBGs ranges 108-148 A1c 06/07/24 5.8%  HLD: on statin, LDL 83 TC 170 11/10/2024  HTN: controlled  BUN 13.3 Cr 0.47 06/07/24  Vit D def: on supplementation, level 31 06/07/24  Using prn tylenol  and mobic for knee pain  Severe AS with CHF follows with cardiology  Echo March 2025 showed Ef 60-65% with severe AS On lasix  with chronic leg edema, no sob.  Weight stable.  Wt Readings from Last 3 Encounters:  11/14/24 190 lb (86.2 kg)  11/08/24 195 lb (88.5 kg)  10/11/24 190 lb 6.4 oz (86.4 kg)    Depression on lexapro :denies feelings of depression or anxiety   Past Medical History:  Diagnosis Date   Aortic valve sclerosis 12/09/2021   Diabetes (HCC)    HLD (hyperlipidemia)    HTN (hypertension)    Hypercholesteremia    Low back pain    Memory loss 03/11/2024   Obesities, morbid (HCC)    Polyneuropathy due to type 2 diabetes mellitus (HCC) 03/20/2021   Primary osteoarthritis of both knees 06/06/2024   Recurrent major depressive disorder, in full remission 06/06/2024   Tremor 06/06/2024   Past Surgical History:  Procedure Laterality Date   APPENDECTOMY     TONSILLECTOMY      Allergies[1]  Outpatient Encounter Medications as of 11/14/2024  Medication Sig   acetaminophen  (TYLENOL ) 500 MG tablet Take 1,000 mg by mouth 3 (three) times daily as needed (reason not listed on MAR).   atorvastatin  (LIPITOR) 20 MG tablet Take 1 tablet (20 mg total) by mouth daily.   cholecalciferol  (VITAMIN D ) 1000 UNITS tablet Take 1,000 Units by mouth daily.   diltiazem  (CARDIZEM  CD) 180 MG 24 hr capsule Take 1 capsule (180 mg total) by mouth daily.   escitalopram  (LEXAPRO ) 10 MG tablet Take 10 mg by mouth daily.   fluconazole (DIFLUCAN) 100 MG tablet Take 100 mg by mouth  daily.   furosemide  (LASIX ) 40 MG tablet Take 1 tablet (40 mg total) by mouth every morning.   meloxicam (MOBIC) 7.5 MG tablet Take 7.5 mg by mouth daily as needed for pain.   Multiple Vitamin (MULTIVITAMIN ADULT PO) Take 1 tablet by mouth daily.   Multiple Vitamins-Minerals (PRESERVISION AREDS 2 PO) Take 1 tablet by mouth 2 (two) times daily.   nystatin (MYCOSTATIN/NYSTOP) powder Apply 1 Application topically 3 (three) times daily.   OZEMPIC, 0.25 OR 0.5 MG/DOSE, 2 MG/3ML SOPN Inject 0.5 mg into the skin once a week.   potassium chloride  SA (KLOR-CON  M) 20 MEQ tablet Take 1 tablet  (20 mEq total) by mouth daily. 20 meq, oral, once a morning, Potassium, 20meq, PO daily per 02/01/24   Sennosides-Docusate Sodium  (SENNA PLUS PO) Take 1 tablet by mouth daily as needed.   Sodium Fluoride 1.1 % PSTE Place 1 Application onto teeth every evening. Apply pea size amount on tooth brush for oral care.   No facility-administered encounter medications on file as of 11/14/2024.    Review of Systems  Constitutional:  Negative for activity change, appetite change, chills, diaphoresis, fatigue and fever.  HENT:  Negative for congestion.   Respiratory:  Negative for cough, shortness of breath and wheezing.   Cardiovascular:  Positive for leg swelling. Negative for chest pain.  Gastrointestinal:  Negative for abdominal distention, abdominal pain, constipation, diarrhea, nausea and vomiting.  Genitourinary:  Negative for difficulty urinating, dysuria and urgency.  Musculoskeletal:  Positive for arthralgias and gait problem. Negative for back pain, myalgias and neck pain.  Skin:  Positive for rash.  Neurological:  Negative for dizziness and weakness.  Psychiatric/Behavioral:  Negative for confusion.     Immunization History  Administered Date(s) Administered   Fluad  Quad(high Dose 65+) 07/28/2022   Fluad  Trivalent(High Dose 65+) 12/19/2023   Influenza Split 07/26/2009, 07/31/2010, 08/13/2011, 09/02/2012, 09/20/2013, 08/04/2019, 08/24/2020   Influenza,inj,Quad PF,6+ Mos 07/29/2017   Influenza-Unspecified 07/26/2024   Moderna Sars-Covid-2 Vaccination 11/22/2019, 12/06/2019, 08/31/2020, 08/09/2021   PNEUMOCOCCAL CONJUGATE-20 11/24/2022   Pfizer(Comirnaty )Fall Seasonal Vaccine 12 years and older 08/12/2022   Pneumococcal Conjugate-13 10/02/2014   Pneumococcal Polysaccharide-23 03/21/2009, 04/22/2017   Td 03/21/2009   Tdap 08/18/2019   Unspecified SARS-COV-2 Vaccination 08/06/2024   Zoster Recombinant(Shingrix) 09/13/2019, 10/13/2023   Zoster, Live 03/15/2013, 09/13/2019, 03/06/2020    Pertinent  Health Maintenance Due  Topic Date Due   HEMOGLOBIN A1C  12/08/2024   OPHTHALMOLOGY EXAM  08/29/2025   FOOT EXAM  09/29/2025   Influenza Vaccine  Completed   Bone Density Scan  Discontinued      10/19/2023   11:34 AM 10/11/2024    4:20 PM  Fall Risk  Falls in the past year? 0 Exclusion - non ambulatory  Was there an injury with Fall? 0    Fall Risk Category Calculator 0   Patient at Risk for Falls Due to No Fall Risks   Fall risk Follow up Falls evaluation completed      Data saved with a previous flowsheet row definition   Functional Status Survey:    Vitals:   11/14/24 0905  BP: (!) 125/58  Pulse: 66  Resp: 18  Temp: 98.7 F (37.1 C)  SpO2: 93%  Weight: 190 lb (86.2 kg)  Height: 5' 5 (1.651 m)   Body mass index is 31.62 kg/m. Physical Exam Vitals and nursing note reviewed.  Constitutional:      General: She is not in acute distress.    Appearance: She is not  diaphoretic.  HENT:     Head: Normocephalic and atraumatic.  Neck:     Vascular: No JVD.  Cardiovascular:     Rate and Rhythm: Normal rate and regular rhythm.     Heart sounds: Murmur heard.  Pulmonary:     Effort: Pulmonary effort is normal. No respiratory distress.     Breath sounds: Normal breath sounds. No wheezing.  Abdominal:     General: Bowel sounds are normal. There is no distension.     Palpations: Abdomen is soft.  Musculoskeletal:     Right lower leg: Edema present.     Left lower leg: Edema present.  Skin:    General: Skin is warm and dry.     Comments: Erythema and dry scaly skin to both feet from the mid foot down, improved from prior exam  Neurological:     Mental Status: She is alert and oriented to person, place, and time.  Psychiatric:        Mood and Affect: Mood normal.     Labs reviewed: Recent Labs    01/23/24 1247 01/24/24 0333 01/24/24 1431 02/01/24 0000 05/10/24 1509 06/07/24 0000  NA 140 140  --  142 144 140  K 4.0 3.1*   < > 3.9 4.3 3.8   CL 107 105  --  107 104 105  CO2 27 28  --  26* 23 24*  GLUCOSE 129* 115*  --   --  117*  --   BUN 13 12  --  11 13 13   CREATININE 0.43* 0.52  --  0.4* 0.53* 0.5  CALCIUM  8.8* 8.3*  --  8.7 9.5 9.2   < > = values in this interval not displayed.   Recent Labs    01/23/24 1247  AST 30  ALT 15  ALKPHOS 105  BILITOT 1.7*  PROT 5.8*  ALBUMIN 2.7*   Recent Labs    01/23/24 1247 01/24/24 0333 01/29/24 0000 02/01/24 0000 06/07/24 0000  WBC 9.7 9.9 8.4 8.0 7.8  NEUTROABS 5.6  --   --   --   --   HGB 11.7* 11.0* 11.6* 11.3* 12.7  HCT 35.5* 33.2* 35* 34* 38  MCV 92.7 92.7  --   --   --   PLT 291 291 312 316 273   No results found for: TSH Lab Results  Component Value Date   HGBA1C 5.8 06/07/2024   Lab Results  Component Value Date   CHOL 170 11/10/2024   HDL 75 (A) 11/10/2024   LDLCALC 83 11/10/2024   TRIG 63 11/10/2024    Significant Diagnostic Results in last 30 days:  No results found.  Assessment/Plan Type 2 diabetes mellitus with mild nonproliferative diabetic retinopathy without macular edema, bilateral (HCC) Needs A1C On ozempic  Tinea pedis Improving, extend course of diflucan to 14 days  Primary osteoarthritis of both knees Chronic pain but using prn mobic and tylenol  with no complaints.   Chronic diastolic (congestive) heart failure (HCC) Compensated On lasix  Also wears compression hose   Aortic stenosis Medical management Followed by cardiology   Essential hypertension Controlled   Memory loss Short term memory loss Very pleasant and appropriate for skilled care    Labs/tests ordered:  A1C CMP CBC        [1]  Allergies Allergen Reactions   Penicillin G Anaphylaxis    Throat closing up   Arthro-Complex [Nutritional Supplements] Other (See Comments)    Allergy not listed on MAR    Empagliflozin  Other (See Comments)    Yeast infection   Levaquin [Levofloxacin In D5w] Other (See Comments)    Reaction not listed on MAR     Levofloxacin Other (See Comments)    Reaction not listed on MAR    Lipitor [Atorvastatin ] Other (See Comments)    Allergy not listed on MAR    Metformin Hcl Other (See Comments)    Stomach cramps   Terbinafine Other (See Comments)    Reaction not listed on MAR   "

## 2024-11-14 NOTE — Assessment & Plan Note (Signed)
 Medical management Followed by cardiology

## 2024-11-14 NOTE — Assessment & Plan Note (Signed)
 Needs A1C On ozempic

## 2024-11-14 NOTE — Assessment & Plan Note (Signed)
 Controlled.

## 2024-11-14 NOTE — Assessment & Plan Note (Signed)
 Short term memory loss Very pleasant and appropriate for skilled care

## 2024-11-29 ENCOUNTER — Non-Acute Institutional Stay (SKILLED_NURSING_FACILITY): Admitting: Orthopedic Surgery

## 2024-11-29 ENCOUNTER — Encounter: Payer: Self-pay | Admitting: Orthopedic Surgery

## 2024-11-29 DIAGNOSIS — I872 Venous insufficiency (chronic) (peripheral): Secondary | ICD-10-CM | POA: Diagnosis not present

## 2024-11-29 DIAGNOSIS — L539 Erythematous condition, unspecified: Secondary | ICD-10-CM

## 2024-11-29 DIAGNOSIS — E113293 Type 2 diabetes mellitus with mild nonproliferative diabetic retinopathy without macular edema, bilateral: Secondary | ICD-10-CM

## 2024-11-29 DIAGNOSIS — Z7985 Long-term (current) use of injectable non-insulin antidiabetic drugs: Secondary | ICD-10-CM | POA: Diagnosis not present

## 2024-11-29 NOTE — Progress Notes (Signed)
 " Location[facility] Room Number: 118-P Place of Service:  SNF [phone]) Provider:  Greig Cluster, NP    Patient Care Team: Charlanne Fredia CROME, MD as PCP - General (Internal Medicine)  Extended Emergency Contact Information Primary Emergency Contact: trotter,[name] Address: [address]          [place], [place] [address] [place] Home Phone: [phone] Work Phone: [phone] Mobile Phone: [phone] Relation: Daughter  Code Status:  DNR Goals of care: Advanced Directive information    06/06/2024   11:16 AM  Advanced Directives  Does Patient Have a Medical Advance Directive? Yes  Type of Advance Directive Out of facility DNR (pink MOST or yellow form)  Does patient want to make changes to medical advance directive? No - Patient declined     Chief Complaint  Patient presents with   Acute Visit    Right foot redness    HPI:  Pt is a 89 y.o. female seen today for acute visit due to right and left foot redness.   She currently resides on the skilled nursing unit at [facility]. PMH: aortic stenosis, HTN, HLD venous insufficiency, T2DM, and obesity.   12/12 prescribed Bactrim  due to concerns of cellulitis. She has also received diflucan. Today, nursing concerned right and left dorsal foot remain red. Overall skin appearance of feet is dry with flaking skin. She denies pain or discomfort to feet. Afebrile. No recent injury.   H/o chronic venous insufficiency, remains on ted hose and furosemide .   A1c 5.8 06/07/2024. Remains on Ozempic. No recent hypoglycemia.    Past Medical History:  Diagnosis Date   Aortic valve sclerosis 12/09/2021   CHF (congestive heart failure) (HCC) 01/19/24   Didnt know Inhad this   Diabetes (HCC)    Heart murmur    HLD (hyperlipidemia)    HTN (hypertension)    Hypercholesteremia    Low back pain    Memory loss 03/11/2024   Obesities, morbid (HCC)    Polyneuropathy due to type 2 diabetes  mellitus (HCC) 03/20/2021   Primary osteoarthritis of both knees 06/06/2024   Recurrent major depressive disorder, in full remission 06/06/2024   Tremor 06/06/2024   Past Surgical History:  Procedure Laterality Date   APPENDECTOMY     TONSILLECTOMY      Allergies[1]  Outpatient Encounter Medications as of 11/29/2024  Medication Sig   acetaminophen  (TYLENOL ) 500 MG tablet Take 1,000 mg by mouth 3 (three) times daily as needed (reason not listed on MAR).   atorvastatin  (LIPITOR) 20 MG tablet Take 1 tablet (20 mg total) by mouth daily.   cholecalciferol  (VITAMIN D ) 1000 UNITS tablet Take 1,000 Units by mouth daily.   diltiazem  (CARDIZEM  CD) 180 MG 24 hr capsule Take 1 capsule (180 mg total) by mouth daily.   escitalopram  (LEXAPRO ) 10 MG tablet Take 10 mg by mouth daily.   furosemide  (LASIX ) 40 MG tablet Take 1 tablet (40 mg total) by mouth every morning.   meloxicam (MOBIC) 7.5 MG tablet Take 7.5 mg by mouth daily as needed for pain.   Multiple Vitamin (MULTIVITAMIN ADULT PO) Take 1 tablet by mouth daily.   Multiple Vitamins-Minerals (PRESERVISION AREDS 2 PO) Take 1 tablet by mouth 2 (two) times daily.   nystatin (MYCOSTATIN/NYSTOP) powder Apply 1 Application topically 3 (three) times daily. (Patient taking differently: Apply 1 Application topically daily.)   OZEMPIC, 0.25 OR 0.5 MG/DOSE, 2 MG/3ML SOPN Inject 0.5 mg into the skin once a week.  potassium chloride  SA (KLOR-CON  M) 20 MEQ tablet Take 1 tablet (20 mEq total) by mouth daily. 20 meq, oral, once a morning, Potassium, 20meq, PO daily per 02/01/24   Sennosides-Docusate Sodium  (SENNA PLUS PO) Take 1 tablet by mouth daily as needed.   Sodium Fluoride 1.1 % PSTE Place 1 Application onto teeth every evening. Apply pea size amount on tooth brush for oral care.   fluconazole (DIFLUCAN) 100 MG tablet Take 100 mg by mouth daily. (Patient not taking: Reported on 11/29/2024)   No facility-administered encounter medications on file as of 11/29/2024.     Review of Systems  Constitutional:  Negative for fatigue and fever.  Respiratory:  Negative for cough and shortness of breath.   Cardiovascular:  Positive for leg swelling. Negative for chest pain.  Skin:  Positive for color change. Negative for wound.  Neurological:  Positive for weakness.  Psychiatric/Behavioral:  Negative for dysphoric mood. The patient is not nervous/anxious.     Immunization History  Administered Date(s) Administered   Fluad  Quad(high Dose 65+) 07/28/2022   Fluad  Trivalent(High Dose 65+) 12/19/2023   Influenza Split 07/26/2009, 07/31/2010, 08/13/2011, 09/02/2012, 09/20/2013, 08/04/2019, 08/24/2020   Influenza,inj,Quad PF,6+ Mos 07/29/2017   Influenza-Unspecified 07/26/2024   Moderna Sars-Covid-2 Vaccination 11/22/2019, 12/06/2019, 08/31/2020, 08/09/2021   PNEUMOCOCCAL CONJUGATE-20 11/24/2022   Pfizer(Comirnaty )Fall Seasonal Vaccine 12 years and older 08/12/2022   Pneumococcal Conjugate-13 10/02/2014   Pneumococcal Polysaccharide-23 03/21/2009, 04/22/2017   Td 03/21/2009   Tdap 08/18/2019   Unspecified SARS-COV-2 Vaccination 08/06/2024   Zoster Recombinant(Shingrix) 09/13/2019, 10/13/2023   Zoster, Live 03/15/2013, 09/13/2019, 03/06/2020   Pertinent  Health Maintenance Due  Topic Date Due   HEMOGLOBIN A1C  12/08/2024   OPHTHALMOLOGY EXAM  08/29/2025   FOOT EXAM  09/29/2025   Influenza Vaccine  Completed   Bone Density Scan  Discontinued      10/19/2023   11:34 AM 10/11/2024    4:20 PM  Fall Risk  Falls in the past year? 0 Exclusion - non ambulatory  Was there an injury with Fall? 0    Fall Risk Category Calculator 0   Patient at Risk for Falls Due to No Fall Risks   Fall risk Follow up Falls evaluation completed      Data saved with a previous flowsheet row definition   Functional Status Survey:    Vitals:   11/29/24 1408  BP: 117/73  Pulse: 73  Temp: (!) 96.8 F (36 C)  SpO2: 93%  Weight: 186 lb 9.6 oz (84.6 kg)  Height: 5' 5  (1.651 m)   Body mass index is 31.05 kg/m. Physical Exam Vitals reviewed.  Constitutional:      General: She is not in acute distress.    Appearance: She is not ill-appearing.  HENT:     Head: Normocephalic.  Eyes:     General:        Right eye: No discharge.        Left eye: No discharge.  Cardiovascular:     Rate and Rhythm: Normal rate and regular rhythm.     Pulses: Normal pulses.     Heart sounds: Normal heart sounds.  Pulmonary:     Effort: Pulmonary effort is normal.     Breath sounds: Normal breath sounds.  Abdominal:     General: Bowel sounds are normal. There is no distension.     Palpations: Abdomen is soft.     Tenderness: There is no abdominal tenderness.  Musculoskeletal:     Cervical back: Neck supple.  Right lower leg: Edema present.     Left lower leg: Edema present.     Comments: BLE 1+ pitting  Skin:    Findings: Erythema present.     Comments: Erythema noted to right and left dorsal foot, overall skin dry and flaking, no warmth/ wound/ tenderness/ bruising/purulent drainage  Neurological:     General: No focal deficit present.     Mental Status: She is alert and oriented to person, place, and time.     Gait: Gait abnormal.  Psychiatric:        Mood and Affect: Mood normal.     Labs reviewed: Recent Labs    01/23/24 1247 01/24/24 0333 01/24/24 1431 02/01/24 0000 05/10/24 1509 06/07/24 0000  NA 140 140  --  142 144 140  K 4.0 3.1*   < > 3.9 4.3 3.8  CL 107 105  --  107 104 105  CO2 27 28  --  26* 23 24*  GLUCOSE 129* 115*  --   --  117*  --   BUN 13 12  --  11 13 13   CREATININE 0.43* 0.52  --  0.4* 0.53* 0.5  CALCIUM  8.8* 8.3*  --  8.7 9.5 9.2   < > = values in this interval not displayed.   Recent Labs    01/23/24 1247  AST 30  ALT 15  ALKPHOS 105  BILITOT 1.7*  PROT 5.8*  ALBUMIN 2.7*   Recent Labs    01/23/24 1247 01/24/24 0333 01/29/24 0000 02/01/24 0000 06/07/24 0000  WBC 9.7 9.9 8.4 8.0 7.8  NEUTROABS 5.6  --    --   --   --   HGB 11.7* 11.0* 11.6* 11.3* 12.7  HCT 35.5* 33.2* 35* 34* 38  MCV 92.7 92.7  --   --   --   PLT 291 291 312 316 273   No results found for: TSH Lab Results  Component Value Date   HGBA1C 5.8 06/07/2024   Lab Results  Component Value Date   CHOL 170 11/10/2024   HDL 75 (A) 11/10/2024   LDLCALC 83 11/10/2024   TRIG 63 11/10/2024    Significant Diagnostic Results in last 30 days:  No results found.  Assessment/Plan 1. Erythema of foot (Primary) - ongoing - completed Bactrim  and diflucan - mild erythema to left and right dorsal foot - no warmth, pain or fever - do not suspect infection - apply vaseline to both feet at bedtime x 21 days to improve dry skin/ decrease risk of infection   2. Chronic venous insufficiency - BLE 1+ pitting - cont ted house and furosemide   3. Type 2 diabetes mellitus with both eyes affected by mild nonproliferative retinopathy without macular edema, without long-term current use of insulin  (HCC) - A1c stable with Ozempic     Family/ staff Communication: plan discussed with patient and nurse  Labs/tests ordered:  cbc/diff, cmp, A1c 12/13/2024     [1]  Allergies Allergen Reactions   Penicillin G Anaphylaxis    Throat closing up   Arthro-Complex [Nutritional Supplements] Other (See Comments)    Allergy not listed on MAR    Empagliflozin Other (See Comments)    Yeast infection   Levaquin [Levofloxacin In D5w] Other (See Comments)    Reaction not listed on MAR    Levofloxacin Other (See Comments)    Reaction not listed on MAR    Lipitor [Atorvastatin ] Other (See Comments)    Allergy not listed on [facility]  Metformin Hcl Other (See Comments)    Stomach cramps   Terbinafine Other (See Comments)    Reaction not listed on MAR   "

## 2024-12-07 ENCOUNTER — Ambulatory Visit: Admitting: Podiatry
